# Patient Record
Sex: Male | Born: 1950
Health system: Southern US, Community
[De-identification: ages and names within clinical notes are randomized; demographics above are authoritative.]

## PROBLEM LIST (undated history)

## (undated) DIAGNOSIS — D649 Anemia, unspecified: Secondary | ICD-10-CM

## (undated) DIAGNOSIS — E785 Hyperlipidemia, unspecified: Secondary | ICD-10-CM

## (undated) DIAGNOSIS — K649 Unspecified hemorrhoids: Secondary | ICD-10-CM

## (undated) DIAGNOSIS — I10 Essential (primary) hypertension: Secondary | ICD-10-CM

## (undated) DIAGNOSIS — L0501 Pilonidal cyst with abscess: Secondary | ICD-10-CM

## (undated) DIAGNOSIS — S76019A Strain of muscle, fascia and tendon of unspecified hip, initial encounter: Secondary | ICD-10-CM

## (undated) DIAGNOSIS — E119 Type 2 diabetes mellitus without complications: Secondary | ICD-10-CM

## (undated) HISTORY — DX: Unspecified hemorrhoids: K64.9

## (undated) HISTORY — DX: Pilonidal cyst with abscess: L05.01

## (undated) HISTORY — DX: Hyperlipidemia, unspecified: E78.5

## (undated) HISTORY — DX: Type 2 diabetes mellitus without complications: E11.9

## (undated) HISTORY — DX: Strain of muscle, fascia and tendon of unspecified hip, initial encounter: S76.019A

## (undated) HISTORY — DX: Anemia, unspecified: D64.9

## (undated) HISTORY — DX: Essential (primary) hypertension: I10

---

## 1979-10-25 HISTORY — PX: VASECTOMY: SHX75

## 1983-10-25 HISTORY — PX: LUNG SURGERY: SHX703

## 2001-03-22 LAB — HM DEXA SCAN: HM Dexa Scan: NORMAL

## 2006-02-13 DIAGNOSIS — I10 Essential (primary) hypertension: Secondary | ICD-10-CM | POA: Insufficient documentation

## 2006-04-03 ENCOUNTER — Ambulatory Visit: Payer: Self-pay | Admitting: General Surgery

## 2010-01-28 LAB — PSA: PSA: 1.2

## 2010-06-07 ENCOUNTER — Ambulatory Visit: Payer: Self-pay | Admitting: Family Medicine

## 2010-06-08 ENCOUNTER — Ambulatory Visit: Payer: Self-pay | Admitting: Family Medicine

## 2010-08-25 ENCOUNTER — Ambulatory Visit: Payer: Self-pay | Admitting: Gastroenterology

## 2010-08-25 LAB — HM COLONOSCOPY: HM Colonoscopy: NORMAL

## 2010-08-29 LAB — PATHOLOGY REPORT

## 2014-08-25 LAB — CBC AND DIFFERENTIAL
HCT: 46 % (ref 41–53)
HEMOGLOBIN: 16.3 g/dL (ref 13.5–17.5)
Platelets: 235 10*3/uL (ref 150–399)
WBC: 6.3 10*3/mL

## 2014-12-31 LAB — HEMOGLOBIN A1C: Hgb A1c MFr Bld: 6.8 % — AB (ref 4.0–6.0)

## 2015-05-30 ENCOUNTER — Other Ambulatory Visit: Payer: Self-pay | Admitting: Family Medicine

## 2015-05-30 DIAGNOSIS — E538 Deficiency of other specified B group vitamins: Secondary | ICD-10-CM

## 2015-06-01 DIAGNOSIS — E538 Deficiency of other specified B group vitamins: Secondary | ICD-10-CM | POA: Insufficient documentation

## 2015-06-01 NOTE — Telephone Encounter (Signed)
Last OV 12/2014   

## 2015-06-09 ENCOUNTER — Telehealth: Payer: Self-pay | Admitting: Family Medicine

## 2015-06-09 NOTE — Telephone Encounter (Signed)
Pt is coming in for F/U on 06/22/15 and wanted to know if he should have blood work done before the appt. Pt is aware that Dr. Elease Hashimoto is out of the office this week. Thanks TNP

## 2015-06-09 NOTE — Telephone Encounter (Signed)
Spoke with Arles regarding the note below, pt was advised to be fasting on the day of the appointment for a possible lipids lab.  Thanks,

## 2015-06-12 DIAGNOSIS — M25519 Pain in unspecified shoulder: Secondary | ICD-10-CM | POA: Insufficient documentation

## 2015-06-12 DIAGNOSIS — M25569 Pain in unspecified knee: Secondary | ICD-10-CM | POA: Insufficient documentation

## 2015-06-12 DIAGNOSIS — L0501 Pilonidal cyst with abscess: Secondary | ICD-10-CM | POA: Insufficient documentation

## 2015-06-12 DIAGNOSIS — L409 Psoriasis, unspecified: Secondary | ICD-10-CM | POA: Insufficient documentation

## 2015-06-12 DIAGNOSIS — K649 Unspecified hemorrhoids: Secondary | ICD-10-CM

## 2015-06-12 HISTORY — DX: Unspecified hemorrhoids: K64.9

## 2015-06-12 HISTORY — DX: Pilonidal cyst with abscess: L05.01

## 2015-06-20 ENCOUNTER — Other Ambulatory Visit: Payer: Self-pay | Admitting: Family Medicine

## 2015-06-20 DIAGNOSIS — E119 Type 2 diabetes mellitus without complications: Secondary | ICD-10-CM

## 2015-06-20 DIAGNOSIS — E7801 Familial hypercholesterolemia: Secondary | ICD-10-CM

## 2015-06-22 ENCOUNTER — Encounter: Payer: Self-pay | Admitting: Family Medicine

## 2015-06-22 ENCOUNTER — Ambulatory Visit (INDEPENDENT_AMBULATORY_CARE_PROVIDER_SITE_OTHER): Payer: BC Managed Care – PPO | Admitting: Family Medicine

## 2015-06-22 VITALS — BP 116/60 | HR 66 | Temp 98.2°F | Resp 16 | Wt 128.2 lb

## 2015-06-22 DIAGNOSIS — G2581 Restless legs syndrome: Secondary | ICD-10-CM

## 2015-06-22 DIAGNOSIS — E119 Type 2 diabetes mellitus without complications: Secondary | ICD-10-CM

## 2015-06-22 DIAGNOSIS — E538 Deficiency of other specified B group vitamins: Secondary | ICD-10-CM

## 2015-06-22 DIAGNOSIS — E7801 Familial hypercholesterolemia: Secondary | ICD-10-CM

## 2015-06-22 DIAGNOSIS — J309 Allergic rhinitis, unspecified: Secondary | ICD-10-CM | POA: Insufficient documentation

## 2015-06-22 DIAGNOSIS — J3089 Other allergic rhinitis: Secondary | ICD-10-CM | POA: Diagnosis not present

## 2015-06-22 DIAGNOSIS — E780 Pure hypercholesterolemia: Secondary | ICD-10-CM

## 2015-06-22 LAB — POCT GLYCOSYLATED HEMOGLOBIN (HGB A1C): Hemoglobin A1C: 6.8

## 2015-06-22 NOTE — Progress Notes (Signed)
Patient: Jeremiah Meyer Male    DOB: 11-17-1950   64 y.o.   MRN: [id number] Visit Date: 06/22/2015  Today's Provider: Lorie Phenix, MD   Chief Complaint  Patient presents with  . Diabetes   Subjective:    Diabetes He presents for his follow-up diabetic visit. He has type 2 diabetes mellitus. His disease course has been stable. Pertinent negatives for diabetes include no chest pain, no fatigue, no visual change and no weight loss.   Lab Results  Component Value Date   HGBA1C 6.8 06/22/2015  Not been as active as he has been in the past. Feels the 6.8 is accurate.  Going to try to be more active.   BP has been stable.  No trouble with medication.  No dizziness, chest pain.   RLS is bothering him from time to timed but not regularly. Feels that medication is helping.    Does have some PND, and frequent throat clearing. Does not want to take medication for this.      No Known Allergies Previous Medications   CYANOCOBALAMIN (,VITAMIN B-12,) 1000 MCG/ML INJECTION    INJECT 1 ML EVERY MONTH AS DIRECTED   LISINOPRIL-HYDROCHLOROTHIAZIDE (PRINZIDE,ZESTORETIC) 10-12.5 MG PER TABLET    Take 1 tablet by mouth daily.   MELOXICAM (MOBIC) 15 MG TABLET    Take 1 tablet by mouth as needed.   METFORMIN (GLUCOPHAGE) 1000 MG TABLET    Take 1 tablet by mouth 2 (two) times daily.   MULTIPLE VITAMIN TABLET    Take 1 tablet by mouth daily.   ROPINIROLE (REQUIP) 1 MG TABLET    Take 1 tablet by mouth at bedtime.   SIMVASTATIN (ZOCOR) 20 MG TABLET    Take 1 tablet by mouth daily.    Review of Systems  Constitutional: Negative.  Negative for weight loss and fatigue.  HENT: Positive for congestion and postnasal drip (clear throat a lot. Coughs up  a thickened mucus.  ).   Eyes: Negative.   Respiratory: Negative.   Cardiovascular: Negative.  Negative for chest pain and palpitations.  Gastrointestinal: Negative.   Endocrine: Negative.   Genitourinary: Negative.   Musculoskeletal: Negative.     Skin: Negative.   Allergic/Immunologic: Negative.   Neurological: Negative.   Hematological: Negative.   Psychiatric/Behavioral: Negative.     Social History  Substance Use Topics  . Smoking status: Former Games developer  . Smokeless tobacco: Never Used     Comment: Quit 1986  . Alcohol Use: Yes     Comment: a drinks in 3-4 month don't drink much.   Objective:   BP 116/60 mmHg  Pulse 66  Temp(Src) 98.2 F (36.8 C) (Oral)  Resp 16  Wt 128 lb 3.2 oz (58.151 kg)  Physical Exam  Constitutional: He is oriented to person, place, and time. He appears well-developed and well-nourished.  Cardiovascular: Normal rate and regular rhythm.   Pulmonary/Chest: Effort normal and breath sounds normal.  Neurological: He is alert and oriented to person, place, and time.      Assessment & Plan:     1. Controlled diabetes mellitus type II without complication Stable.  Please notify patient.   - POCT glycosylated hemoglobin (Hb A1C) - CBC w/Diff/Platelet - Insulin and C-Peptide Results for orders placed or performed in visit on 06/22/15  POCT glycosylated hemoglobin (Hb A1C)  Result Value Ref Range   Hemoglobin A1C 6.8     2. Other allergic rhinitis With symptoms.  Does not want to  take medication.    3. Restless leg Stable. Keep medication the same. Will call is worsens.  - TSH  4. B12 deficiency Check labs.  - Vitamin B12  5. Essential familial hypercholesterolemia Stable. Due for labs.   - Lipid panel - Comprehensive metabolic panel     Lorie Phenix, MD  [facility]

## 2015-06-23 ENCOUNTER — Telehealth: Payer: Self-pay

## 2015-06-23 LAB — COMPREHENSIVE METABOLIC PANEL
A/G RATIO: 2.5 (ref 1.1–2.5)
ALBUMIN: 4.5 g/dL (ref 3.6–4.8)
ALK PHOS: 66 IU/L (ref 39–117)
ALT: 14 IU/L (ref 0–44)
AST: 17 IU/L (ref 0–40)
BILIRUBIN TOTAL: 0.9 mg/dL (ref 0.0–1.2)
BUN / CREAT RATIO: 16 (ref 10–22)
BUN: 14 mg/dL (ref 8–27)
CHLORIDE: 98 mmol/L (ref 97–108)
CO2: 25 mmol/L (ref 18–29)
CREATININE: 0.89 mg/dL (ref 0.76–1.27)
Calcium: 9.2 mg/dL (ref 8.6–10.2)
GFR calc Af Amer: 104 mL/min/{1.73_m2} (ref >59)
GFR calc non Af Amer: 90 mL/min/{1.73_m2} (ref >59)
GLOBULIN, TOTAL: 1.8 g/dL (ref 1.5–4.5)
Glucose: 167 mg/dL — ABNORMAL HIGH (ref 65–99)
POTASSIUM: 4.5 mmol/L (ref 3.5–5.2)
SODIUM: 138 mmol/L (ref 134–144)
Total Protein: 6.3 g/dL (ref 6.0–8.5)

## 2015-06-23 LAB — LIPID PANEL
CHOLESTEROL TOTAL: 150 mg/dL (ref 100–199)
Chol/HDL Ratio: 2.9 ratio units (ref 0.0–5.0)
HDL: 52 mg/dL (ref >39)
LDL CALC: 87 mg/dL (ref 0–99)
Triglycerides: 53 mg/dL (ref 0–149)
VLDL CHOLESTEROL CAL: 11 mg/dL (ref 5–40)

## 2015-06-23 LAB — CBC WITH DIFFERENTIAL/PLATELET
BASOS: 1 %
Basophils Absolute: 0 10*3/uL (ref 0.0–0.2)
EOS (ABSOLUTE): 0.1 10*3/uL (ref 0.0–0.4)
EOS: 1 %
HEMATOCRIT: 42.3 % (ref 37.5–51.0)
Hemoglobin: 14.3 g/dL (ref 12.6–17.7)
IMMATURE GRANULOCYTES: 0 %
Immature Grans (Abs): 0 10*3/uL (ref 0.0–0.1)
LYMPHS ABS: 1.2 10*3/uL (ref 0.7–3.1)
Lymphs: 22 %
MCH: 29.5 pg (ref 26.6–33.0)
MCHC: 33.8 g/dL (ref 31.5–35.7)
MCV: 87 fL (ref 79–97)
MONOS ABS: 0.5 10*3/uL (ref 0.1–0.9)
Monocytes: 9 %
NEUTROS PCT: 67 %
Neutrophils Absolute: 3.9 10*3/uL (ref 1.4–7.0)
PLATELETS: 220 10*3/uL (ref 150–379)
RBC: 4.84 x10E6/uL (ref 4.14–5.80)
RDW: 13.5 % (ref 12.3–15.4)
WBC: 5.7 10*3/uL (ref 3.4–10.8)

## 2015-06-23 LAB — VITAMIN B12: VITAMIN B 12: 289 pg/mL (ref 211–946)

## 2015-06-23 LAB — INSULIN AND C-PEPTIDE, SERUM
C-Peptide: 2 ng/mL (ref 1.1–4.4)
INSULIN: 4.5 u[IU]/mL (ref 2.6–24.9)

## 2015-06-23 LAB — TSH: TSH: 1.42 u[IU]/mL (ref 0.450–4.500)

## 2015-06-23 NOTE — Telephone Encounter (Signed)
Pt advised as directed below.   Thanks,   -Gamaliel Charney  

## 2015-06-23 NOTE — Telephone Encounter (Signed)
-----   Message from Lorie Phenix, MD sent at 06/23/2015  2:36 PM EDT ----- Labs stable including insulin and C- peptide. Ok to continue plan of care. Thanks.

## 2015-07-14 ENCOUNTER — Other Ambulatory Visit: Payer: Self-pay | Admitting: Family Medicine

## 2015-07-14 DIAGNOSIS — I10 Essential (primary) hypertension: Secondary | ICD-10-CM

## 2015-08-02 ENCOUNTER — Other Ambulatory Visit: Payer: Self-pay | Admitting: Family Medicine

## 2015-08-02 DIAGNOSIS — G2581 Restless legs syndrome: Secondary | ICD-10-CM

## 2015-09-04 ENCOUNTER — Ambulatory Visit (INDEPENDENT_AMBULATORY_CARE_PROVIDER_SITE_OTHER): Payer: BC Managed Care – PPO | Admitting: Family Medicine

## 2015-09-04 ENCOUNTER — Encounter: Payer: Self-pay | Admitting: Family Medicine

## 2015-09-04 VITALS — BP 102/68 | HR 60 | Temp 98.7°F | Resp 16 | Ht 64.0 in | Wt 126.0 lb

## 2015-09-04 DIAGNOSIS — J3089 Other allergic rhinitis: Secondary | ICD-10-CM | POA: Diagnosis not present

## 2015-09-04 DIAGNOSIS — Z0000 Encounter for general adult medical examination without abnormal findings: Secondary | ICD-10-CM

## 2015-09-04 DIAGNOSIS — Z23 Encounter for immunization: Secondary | ICD-10-CM | POA: Diagnosis not present

## 2015-09-04 DIAGNOSIS — E119 Type 2 diabetes mellitus without complications: Secondary | ICD-10-CM

## 2015-09-04 MED ORDER — FLUTICASONE PROPIONATE 50 MCG/ACT NA SUSP: 2.0000 | Freq: Every day | NASAL | Status: DC

## 2015-09-04 NOTE — Progress Notes (Signed)
Patient ID: Jeremiah Meyer, male   DOB: 1951/06/28, 64 y.o.   MRN: [id number]        Patient: Jeremiah Meyer, Male    DOB: 10-06-1951, 64 y.o.   MRN: [id number] Visit Date: 09/04/2015  Today's Provider: Lorie Phenix, MD   Chief Complaint  Patient presents with  . Annual Exam   Subjective:    Annual physical exam Jeremiah Meyer is a 64 y.o. male who presents today for health maintenance and complete physical. He feels well. He reports exercising stays active with daily activites. He reports he is sleeping fairly well. 11/02/11Colon-Normal 03/22/01 BMD-normal  Lab Results  Component Value Date   WBC 5.7 06/22/2015   HGB 16.3 08/25/2014   HCT 42.3 06/22/2015   PLT 235 08/25/2014   GLUCOSE 167* 06/22/2015   CHOL 150 06/22/2015   TRIG 53 06/22/2015   HDL 52 06/22/2015   LDLCALC 87 06/22/2015   ALT 14 06/22/2015   AST 17 06/22/2015   NA 138 06/22/2015   K 4.5 06/22/2015   CL 98 06/22/2015   CREATININE 0.89 06/22/2015   BUN 14 06/22/2015   CO2 25 06/22/2015   TSH 1.420 06/22/2015   PSA 1.2 01/28/2010   HGBA1C 6.8 06/22/2015   ------------------------------------------------------------------------------------------------------------------------------- Review of Systems  Constitutional: Negative.   HENT: Negative.   Eyes: Negative.   Respiratory: Negative.   Cardiovascular: Negative.   Gastrointestinal: Negative.   Endocrine: Negative.   Genitourinary: Negative.   Musculoskeletal: Negative.   Skin: Negative.   Allergic/Immunologic: Negative.   Neurological: Negative.   Hematological: Negative.   Psychiatric/Behavioral: Negative.     Social History He  reports that he has quit smoking. He has never used smokeless tobacco. He reports that he drinks about 0.6 oz of alcohol per week. He reports that he does not use illicit drugs. Social History   Social History  . Marital Status: Married    Spouse Name: beatrice  . Number of Children: 1  . Years of  Education: N/A   Social History Main Topics  . Smoking status: Former Games developer  . Smokeless tobacco: Never Used     Comment: Quit 1986  . Alcohol Use: 0.6 oz/week    1 Cans of beer per week     Comment: a drinks in 3-4 month don't drink much.  . Drug Use: No  . Sexual Activity: Not Asked   Other Topics Concern  . None   Social History Narrative    Patient Active Problem List   Diagnosis Date Noted  . Allergic rhinitis 06/22/2015  . Hemorrhoid 06/12/2015  . Gonalgia 06/12/2015  . Cyst, pilonidal, with abscess 06/12/2015  . Psoriasis 06/12/2015  . Pain in shoulder 06/12/2015  . B12 deficiency 06/01/2015  . Anemia due to blood loss, chronic 04/12/2010  . Cardiac conduction disorder 04/07/2010  . ED (erectile dysfunction) of organic origin 03/27/2009  . Cervical pain 05/16/2007  . Enthesopathy 05/16/2007  . Essential familial hypercholesterolemia 06/14/2006  . Essential (primary) hypertension 02/13/2006  . Restless leg 05/30/2002  . Controlled diabetes mellitus type II without complication (HCC) 08/09/1999    Past Surgical History  Procedure Laterality Date  . Lung surgery Left 1985    S/P RECURRENT PNEUMOTHORAX  . Vasectomy  64    Family History  Family Status  Relation Status Death Age  . Mother Alive   . Father Deceased    His family history is not on file.    No Known Allergies  Previous Medications  CYANOCOBALAMIN (,VITAMIN B-12,) 1000 MCG/ML INJECTION    INJECT 1 ML EVERY MONTH AS DIRECTED   LISINOPRIL-HYDROCHLOROTHIAZIDE (PRINZIDE,ZESTORETIC) 10-12.5 MG PER TABLET    TAKE 1 TABLET DAILY   LORATADINE (CLARITIN) 10 MG TABLET    Take 10 mg by mouth daily.   MELOXICAM (MOBIC) 15 MG TABLET    Take 1 tablet by mouth as needed.   METFORMIN (GLUCOPHAGE) 1000 MG TABLET    TAKE 1 TABLET TWICE A DAY   MULTIPLE VITAMIN TABLET    Take 1 tablet by mouth daily.   ROPINIROLE (REQUIP) 1 MG TABLET    TAKE 1 TABLET AT BEDTIME FOR RESTLESS LEGS   SIMVASTATIN (ZOCOR) 20  MG TABLET    TAKE 1 TABLET DAILY    Patient Care Team: Lorie Phenix, MD as PCP - General (Family Medicine)     Objective:   Vitals: BP 102/68 mmHg  Pulse 60  Temp(Src) 98.7 F (37.1 C) (Oral)  Resp 16  Ht  (1.626 m)  Wt 126 lb (57.153 kg)  BMI 21.62 kg/m2  SpO2 98%   Physical Exam  Constitutional: He is oriented to person, place, and time. He appears well-developed and well-nourished.  HENT:  Head: Normocephalic and atraumatic.  Right Ear: External ear normal.  Left Ear: External ear normal.  Nose: Mucosal edema present.  Mouth/Throat: Oropharynx is clear and moist.  Eyes: Conjunctivae and EOM are normal. Pupils are equal, round, and reactive to light.  Neck: Normal range of motion. Neck supple.  Cardiovascular: Normal rate, regular rhythm, normal heart sounds and intact distal pulses.   Pulmonary/Chest: Effort normal and breath sounds normal.  Abdominal: Soft. Bowel sounds are normal.  Musculoskeletal: Normal range of motion.  Neurological: He is alert and oriented to person, place, and time. He has normal reflexes.  Skin: Skin is warm and dry.  Psychiatric: He has a normal mood and affect. His behavior is normal. Judgment and thought content normal.     Depression Screen PHQ 2/9 Scores 09/04/2015  PHQ - 2 Score 0      Assessment & Plan:     Routine Health Maintenance and Physical Exam  Exercise Activities and Dietary recommendations Goals    . Exercise 150 minutes per week (moderate activity)       Immunization History  Administered Date(s) Administered  . Influenza,inj,Quad PF,36+ Mos 09/04/2015  . Pneumococcal Polysaccharide-23 09/09/2013  . Td 10/24/1997        1. Annual physical exam Stable. Patient advised to continue eating healthy and exercise daily.  2. Need for influenza vaccination - Flu Vaccine QUAD 36+ mos IM  3. Other allergic rhinitis Worsening. Patient started on fluticasone 50 MCG/ACT as below.  - fluticasone  (FLONASE) 50 MCG/ACT nasal spray; Place 2 sprays into both nostrils daily.  Dispense: 16 g; Refill: 3  4. Controlled type 2 diabetes mellitus without complication, without long-term current use of insulin (HCC) Stable. Follow-up in 3 months.  Diabetic Foot Exam - Simple   Simple Foot Form  Diabetic Foot exam was performed with the following findings:  Yes 09/04/2015 10:01 AM  Visual Inspection  No deformities, no ulcerations, no other skin breakdown bilaterally:  Yes  Sensation Testing  Intact to touch and monofilament testing bilaterally:  Yes  Pulse Check  Posterior Tibialis and Dorsalis pulse intact bilaterally:  Yes  Comments         Patient seen and examined by Dr. Leo Grosser, and note scribed by Liz Beach. Dimas, CMA.  I  have reviewed the document for accuracy and completeness and I agree with above. Leo Grosser- Katrine Radich J. Natsuko Kelsay, MD   Lorie PhenixNancy Jacoby Ritsema, MD    --------------------------------------------------------------------

## 2015-09-04 NOTE — Patient Instructions (Signed)
Patient advised to start taking Aspirin 81 mg daily.

## 2015-10-02 ENCOUNTER — Encounter: Payer: BC Managed Care – PPO | Admitting: Family Medicine

## 2015-10-07 ENCOUNTER — Other Ambulatory Visit: Payer: Self-pay | Admitting: Family Medicine

## 2015-10-07 DIAGNOSIS — E119 Type 2 diabetes mellitus without complications: Secondary | ICD-10-CM

## 2015-10-29 ENCOUNTER — Encounter: Payer: Self-pay | Admitting: Family Medicine

## 2015-12-01 ENCOUNTER — Other Ambulatory Visit: Payer: Self-pay | Admitting: Family Medicine

## 2015-12-01 DIAGNOSIS — E538 Deficiency of other specified B group vitamins: Secondary | ICD-10-CM

## 2015-12-04 ENCOUNTER — Ambulatory Visit (INDEPENDENT_AMBULATORY_CARE_PROVIDER_SITE_OTHER): Payer: BC Managed Care – PPO | Admitting: Family Medicine

## 2015-12-04 ENCOUNTER — Encounter: Payer: Self-pay | Admitting: Family Medicine

## 2015-12-04 VITALS — BP 128/80 | HR 68 | Temp 98.1°F | Resp 16 | Wt 133.0 lb

## 2015-12-04 DIAGNOSIS — E119 Type 2 diabetes mellitus without complications: Secondary | ICD-10-CM | POA: Diagnosis not present

## 2015-12-04 DIAGNOSIS — I10 Essential (primary) hypertension: Secondary | ICD-10-CM | POA: Diagnosis not present

## 2015-12-04 LAB — POCT GLYCOSYLATED HEMOGLOBIN (HGB A1C)
ESTIMATED AVERAGE GLUCOSE: 171
HEMOGLOBIN A1C: 7.6

## 2015-12-04 MED ORDER — SITAGLIPTIN PHOSPHATE 100 MG PO TABS: 100.0000 mg | ORAL_TABLET | Freq: Every day | ORAL | Status: DC

## 2015-12-04 NOTE — Progress Notes (Signed)
Subjective:    Patient ID: Jeremiah Meyer, male    DOB: 1951/06/08, 65 y.o.   MRN: [id number]  Diabetes He presents for his follow-up (Last A1C was 06/22/2015 and was 6.8%) diabetic visit. He has type 2 diabetes mellitus. There are no hypoglycemic associated symptoms. Pertinent negatives for hypoglycemia include no sweats. Pertinent negatives for diabetes include no blurred vision, no chest pain, no fatigue, no foot paresthesias, no foot ulcerations, no polydipsia, no polyphagia, no polyuria, no visual change and no weakness. Symptoms are stable. Current diabetic treatment includes oral agent (monotherapy) (Metformin 1000 mg BID). He is compliant with treatment all of the time. He is following a generally healthy diet. He participates in exercise daily (active while working). An ACE inhibitor/angiotensin II receptor blocker is being taken. Eye exam is current [facility] December, 2016).  Hypertension This is a chronic problem. The problem is unchanged. The problem is controlled. Pertinent negatives include no anxiety, blurred vision, chest pain, malaise/fatigue, neck pain, orthopnea, palpitations, peripheral edema, shortness of breath or sweats. Risk factors for coronary artery disease include dyslipidemia, diabetes mellitus and male gender. Treatments tried: Lisinopril-HCTZ 10-12.5 mg. The current treatment provides moderate improvement. There are no compliance problems.       Review of Systems  Constitutional: Negative for malaise/fatigue and fatigue.  Eyes: Negative for blurred vision.  Respiratory: Negative for shortness of breath.   Cardiovascular: Negative for chest pain, palpitations and orthopnea.  Endocrine: Negative for polydipsia, polyphagia and polyuria.  Musculoskeletal: Negative for neck pain.  Neurological: Negative for weakness.   BP 128/80 mmHg  Pulse 68  Temp(Src) 98.1 F (36.7 C) (Oral)  Resp 16  Wt 133 lb (60.328 kg)   Patient Active Problem List   Diagnosis  Date Noted  . Allergic rhinitis 06/22/2015  . Hemorrhoid 06/12/2015  . Gonalgia 06/12/2015  . Cyst, pilonidal, with abscess 06/12/2015  . Psoriasis 06/12/2015  . Pain in shoulder 06/12/2015  . B12 deficiency 06/01/2015  . Anemia due to blood loss, chronic 04/12/2010  . Cardiac conduction disorder 04/07/2010  . ED (erectile dysfunction) of organic origin 03/27/2009  . Cervical pain 05/16/2007  . Enthesopathy 05/16/2007  . Essential familial hypercholesterolemia 06/14/2006  . Essential (primary) hypertension 02/13/2006  . Restless leg 05/30/2002  . Controlled diabetes mellitus type II without complication (HCC) 08/09/1999   Past Medical History  Diagnosis Date  . Anemia   . Diabetes mellitus without complication (HCC)   . Hypertension    Current Outpatient Prescriptions on File Prior to Visit  Medication Sig  . aspirin 81 MG tablet Take 81 mg by mouth daily.  . cyanocobalamin (,VITAMIN B-12,) 1000 MCG/ML injection INJECT 1 ML EVERY MONTH AS DIRECTED  . lisinopril-hydrochlorothiazide (PRINZIDE,ZESTORETIC) 10-12.5 MG per tablet TAKE 1 TABLET DAILY  . metFORMIN (GLUCOPHAGE) 1000 MG tablet TAKE 1 TABLET TWICE A DAY  . Multiple Vitamin tablet Take 1 tablet by mouth daily.  . ONE TOUCH ULTRA TEST test strip USE 1 STRIP TO CHECK BLOOD SUGAR ONCE DAILY  . rOPINIRole (REQUIP) 1 MG tablet TAKE 1 TABLET AT BEDTIME FOR RESTLESS LEGS  . simvastatin (ZOCOR) 20 MG tablet TAKE 1 TABLET DAILY  . fluticasone (FLONASE) 50 MCG/ACT nasal spray Place 2 sprays into both nostrils daily. (Patient not taking: Reported on 12/04/2015)   No current facility-administered medications on file prior to visit.   No Known Allergies Past Surgical History  Procedure Laterality Date  . Lung surgery Left 1985    S/P RECURRENT PNEUMOTHORAX  . Vasectomy  47   Social History   Social History  . Marital Status: Married    Spouse Name: Jeremiah Meyer  . Number of Children: 1  . Years of Education: N/A    Occupational History  . Not on file.   Social History Main Topics  . Smoking status: Former Games developer  . Smokeless tobacco: Never Used     Comment: Quit 1986  . Alcohol Use: 0.6 oz/week    1 Cans of beer per week     Comment: a drinks in 3-4 month don't drink much.  . Drug Use: No  . Sexual Activity: Not on file   Other Topics Concern  . Not on file   Social History Narrative   No family history on file.      Objective:   Physical Exam  Constitutional: He is oriented to person, place, and time. He appears well-developed and well-nourished.  Cardiovascular: Normal rate and regular rhythm.   Pulmonary/Chest: Effort normal and breath sounds normal.  Neurological: He is alert and oriented to person, place, and time.  Psychiatric: He has a normal mood and affect. His behavior is normal. Judgment and thought content normal.   BP 128/80 mmHg  Pulse 68  Temp(Src) 98.1 F (36.7 C) (Oral)  Resp 16  Wt 133 lb (60.328 kg)      Assessment & Plan:  1. Controlled type 2 diabetes mellitus without complication, without long-term current use of insulin (HCC) Not at goal.  Will add Januvia and recheck in 3 months.   - POCT glycosylated hemoglobin (Hb A1C) - sitaGLIPtin (JANUVIA) 100 MG tablet; Take 1 tablet (100 mg total) by mouth daily.  Dispense: 30 tablet; Refill: 5 Results for orders placed or performed in visit on 12/04/15  POCT glycosylated hemoglobin (Hb A1C)  Result Value Ref Range   Hemoglobin A1C 7.6    Est. average glucose Bld gHb Est-mCnc 171     2. Essential (primary) hypertension Condition is stable. Please continue current medication and  plan of care as noted.    Patient was seen and examined by Leo Grosser, MD, and note scribed by Allene Dillon, CMA. I have reviewed the document for accuracy and completeness and I agree with above. Leo Grosser, MD   Lorie Phenix, MD

## 2016-01-18 ENCOUNTER — Telehealth: Payer: Self-pay | Admitting: Family Medicine

## 2016-01-18 MED ORDER — GLIPIZIDE ER 2.5 MG PO TB24
2.5000 mg | ORAL_TABLET | Freq: Every day | ORAL | Status: DC
Start: 2016-01-18 — End: 2016-04-04

## 2016-01-18 NOTE — Telephone Encounter (Signed)
Not sure why elevated. Can be related to infection, may need ov with another provider. Or can start Glipizide Er 2.5 daily. Thanks.

## 2016-01-18 NOTE — Telephone Encounter (Signed)
Patient denies any infection. He would like to start Glipizide 2.5mg . However, he wants to discontinue Januvia 100mg . He does not want to take all 3 diabetic medications together. He wants to take Glipizide instead of Januvia. Is this ok? Please advise. Thanks!

## 2016-01-18 NOTE — Telephone Encounter (Signed)
Needs to take all 3. Diabetes is getting worse. Happens with time, unfortunately. Thanks.

## 2016-01-18 NOTE — Telephone Encounter (Signed)
Patient reports he is taking Metformin 1000 mg BID and Januvia 100 mg daily in the morning. Patient reports fasting blood sugars are ranging 170-180's. Patient reports that he has been watching his diet and is exercising. Please advise. sd

## 2016-01-18 NOTE — Telephone Encounter (Signed)
Dr. Santiago BurMaloney's Pt. He is diabetic.  He is on [place].   His blood glucose reading have been good until last week.  His reading have been from 130 to 180 as a high.   He would like to talk to someone and as if he should adjust the meds or not.  His call back is [phone]  Thanks Barth Kirkseri

## 2016-01-19 NOTE — Telephone Encounter (Signed)
Advised pt as below. Does agree to take all 3 medications, though does want to talk to provider about Januvia at FU OV. States his research insinuates that Januvia does not help all people control their DM. Allene DillonEmily Drozdowski, CMA

## 2016-01-21 ENCOUNTER — Encounter: Payer: BC Managed Care – PPO | Admitting: Family Medicine

## 2016-01-27 ENCOUNTER — Ambulatory Visit (INDEPENDENT_AMBULATORY_CARE_PROVIDER_SITE_OTHER): Payer: PPO | Admitting: Family Medicine

## 2016-01-27 ENCOUNTER — Encounter: Payer: Self-pay | Admitting: Family Medicine

## 2016-01-27 VITALS — BP 110/80 | HR 68 | Temp 98.1°F | Resp 16 | Ht 64.0 in | Wt 129.0 lb

## 2016-01-27 DIAGNOSIS — Z23 Encounter for immunization: Secondary | ICD-10-CM | POA: Diagnosis not present

## 2016-01-27 DIAGNOSIS — Z0000 Encounter for general adult medical examination without abnormal findings: Secondary | ICD-10-CM | POA: Diagnosis not present

## 2016-01-27 NOTE — Progress Notes (Signed)
Patient ID: Jeremiah Meyer, male   DOB: 03-01-1951, 65 y.o.   MRN: [id number]       Patient: Jeremiah Meyer, Male    DOB: 04-20-1951, 65 y.o.   MRN: [id number] Visit Date: 01/27/2016  Today's Provider: Lorie Phenix, MD   Chief Complaint  Patient presents with  . Medicare Wellness   Subjective:    Annual wellness visit Jeremiah Meyer is a 65 y.o. male. He feels well. He reports exercising active with daily activites. He reports he is sleeping fairly well.  09/04/15 CPE 08/25/10 Colonoscopy-normal, recheck 10 years  -----------------------------------------------------------   Review of Systems  Constitutional: Negative.   HENT: Negative.   Eyes: Negative.   Respiratory: Negative.   Cardiovascular: Negative.   Gastrointestinal: Negative.   Endocrine: Negative.   Genitourinary: Negative.   Musculoskeletal: Negative.   Skin: Negative.   Allergic/Immunologic: Negative.   Neurological: Negative.   Hematological: Negative.   Psychiatric/Behavioral: Negative.     Social History   Social History  . Marital Status: Married    Spouse Name: Jeremiah Meyer  . Number of Children: 1  . Years of Education: N/A   Occupational History  . Not on file.   Social History Main Topics  . Smoking status: Former Games developer  . Smokeless tobacco: Never Used     Comment: Quit 1986  . Alcohol Use: 0.6 oz/week    1 Cans of beer per week     Comment: a drinks in 3-4 month don't drink much.  . Drug Use: Yes    Special: Marijuana  . Sexual Activity: Not on file   Other Topics Concern  . Not on file   Social History Narrative    Past Medical History  Diagnosis Date  . Anemia   . Diabetes mellitus without complication (HCC)   . Hypertension      Patient Active Problem List   Diagnosis Date Noted  . Allergic rhinitis 06/22/2015  . Hemorrhoid 06/12/2015  . Gonalgia 06/12/2015  . Cyst, pilonidal, with abscess 06/12/2015  . Psoriasis 06/12/2015  . Pain in shoulder 06/12/2015  .  B12 deficiency 06/01/2015  . Anemia due to blood loss, chronic 04/12/2010  . Cardiac conduction disorder 04/07/2010  . ED (erectile dysfunction) of organic origin 03/27/2009  . Cervical pain 05/16/2007  . Enthesopathy 05/16/2007  . Essential familial hypercholesterolemia 06/14/2006  . Essential (primary) hypertension 02/13/2006  . Restless leg 05/30/2002  . Controlled diabetes mellitus type II without complication (HCC) 08/09/1999    Past Surgical History  Procedure Laterality Date  . Lung surgery Left 1985    S/P RECURRENT PNEUMOTHORAX  . Vasectomy  1981    His family history includes Cancer in his brother and father; Diabetes in his brother.    Previous Medications   ASPIRIN 81 MG TABLET    Take 81 mg by mouth daily.   CYANOCOBALAMIN (,VITAMIN B-12,) 1000 MCG/ML INJECTION    INJECT 1 ML EVERY MONTH AS DIRECTED   FLUTICASONE (FLONASE) 50 MCG/ACT NASAL SPRAY    Place 2 sprays into both nostrils daily.   GLIPIZIDE (GLUCOTROL XL) 2.5 MG 24 HR TABLET    Take 1 tablet (2.5 mg total) by mouth daily with breakfast.   LISINOPRIL-HYDROCHLOROTHIAZIDE (PRINZIDE,ZESTORETIC) 10-12.5 MG PER TABLET    TAKE 1 TABLET DAILY   METFORMIN (GLUCOPHAGE) 1000 MG TABLET    TAKE 1 TABLET TWICE A DAY   MULTIPLE VITAMIN TABLET    Take 1 tablet by mouth daily.   ONE TOUCH ULTRA TEST  TEST STRIP    USE 1 STRIP TO CHECK BLOOD SUGAR ONCE DAILY   ROPINIROLE (REQUIP) 1 MG TABLET    TAKE 1 TABLET AT BEDTIME FOR RESTLESS LEGS   SIMVASTATIN (ZOCOR) 20 MG TABLET    TAKE 1 TABLET DAILY   SITAGLIPTIN (JANUVIA) 100 MG TABLET    Take 1 tablet (100 mg total) by mouth daily.    Patient Care Team: Jeremiah PhenixNancy Jodeci Roarty, MD as PCP - General (Family Medicine)     Objective:   Vitals: BP 110/80 mmHg  Pulse 68  Temp(Src) 98.1 F (36.7 C) (Oral)  Resp 16  Ht 5\' 4"  (1.626 m)  Wt 129 lb (58.514 kg)  BMI 22.13 kg/m2  SpO2 97%  Physical Exam  Constitutional: He is oriented to person, place, and time. He appears well-developed  and well-nourished.  Cardiovascular: Normal rate, regular rhythm and normal heart sounds.   Pulmonary/Chest: Effort normal and breath sounds normal.  Neurological: He is alert and oriented to person, place, and time.  Psychiatric: He has a normal mood and affect. His behavior is normal. Judgment and thought content normal.    Activities of Daily Living In your present state of health, do you have any difficulty performing the following activities: 01/27/2016 09/04/2015  Hearing? N N  Vision? N N  Difficulty concentrating or making decisions? N N  Walking or climbing stairs? N N  Dressing or bathing? N N  Doing errands, shopping? N N    Fall Risk Assessment Fall Risk  01/27/2016 09/04/2015  Falls in the past year? No No     Depression Screen PHQ 2/9 Scores 01/27/2016 09/04/2015  PHQ - 2 Score 1 0    Cognitive Testing - 6-CIT  Correct? Score   What year is it? yes 0 0 or 4  What month is it? yes 0 0 or 3  Memorize:    Floyde ParkinsJohn,  [place],  42,  [address],  [place],      What time is it? (within 1 hour) yes 0 0 or 3  Count backwards from 20 yes 0 0, 2, or 4  Name the months of the year yes 0 0, 2, or 4  Repeat name & address above no 2 0, 2, 4, 6, 8, or 10       TOTAL SCORE  2/28   Interpretation:  Normal  Normal (0-7) Abnormal (8-28)       Assessment & Plan:     Annual Wellness Visit  Reviewed patient's Family Medical History Reviewed and updated list of patient's medical providers Assessment of cognitive impairment was done Assessed patient's functional ability Established a written schedule for health screening services Health Risk Assessent Completed and Reviewed  Exercise Activities and Dietary recommendations Goals    . Exercise 150 minutes per week (moderate activity)       Immunization History  Administered Date(s) Administered  . Influenza,inj,Quad PF,36+ Mos 09/04/2015  . Pneumococcal Conjugate-13 01/27/2016  . Pneumococcal Polysaccharide-23 09/09/2013  .  Td 10/24/1997       1. Medicare annual wellness visit, initial Stable. Patient advised to continue eating healthy and exercise daily. - EKG 12-Lead  2. Need for pneumococcal vaccination - Pneumococcal conjugate vaccine 13-valent IM   Patient seen and examined by Dr. Leo GrosserNancy J.. Makila Colombe, and note scribed by Liz BeachSulibeya S. Dimas, CMA.  I have reviewed the document for accuracy and completeness and I agree with above. Leo Grosser- Theoplis Garciagarcia J. Mihail Prettyman, MD   Jeremiah PhenixNancy Kartik Fernando, MD    ------------------------------------------------------------------------------------------------------------

## 2016-03-23 ENCOUNTER — Other Ambulatory Visit: Payer: Self-pay | Admitting: Family Medicine

## 2016-03-23 ENCOUNTER — Ambulatory Visit (INDEPENDENT_AMBULATORY_CARE_PROVIDER_SITE_OTHER): Payer: PPO | Admitting: Family Medicine

## 2016-03-23 ENCOUNTER — Encounter: Payer: Self-pay | Admitting: Family Medicine

## 2016-03-23 VITALS — BP 130/78 | HR 68 | Temp 98.1°F | Resp 16 | Wt 129.0 lb

## 2016-03-23 DIAGNOSIS — E119 Type 2 diabetes mellitus without complications: Secondary | ICD-10-CM

## 2016-03-23 DIAGNOSIS — E7801 Familial hypercholesterolemia: Secondary | ICD-10-CM

## 2016-03-23 DIAGNOSIS — I10 Essential (primary) hypertension: Secondary | ICD-10-CM

## 2016-03-23 LAB — POCT GLYCOSYLATED HEMOGLOBIN (HGB A1C): HEMOGLOBIN A1C: 6.3

## 2016-03-23 NOTE — Progress Notes (Signed)
Patient ID: Jeremiah Meyer, male   DOB: 11/02/1950, 65 y.o.   MRN: [id number]         Patient: Jeremiah Meyer Male    DOB: 08/24/1951   [age] y.o.   MRN: [id number] Visit Date: 03/23/2016  Today's Provider: Lorie PhenixNancy Damian Buckles, MD   Chief Complaint  Patient presents with  . Diabetes  . Hypertension  . Hyperlipidemia   Subjective:    HPI    Diabetes Mellitus Type II, Follow-up:   Lab Results  Component Value Date   HGBA1C 6.3 03/23/2016   HGBA1C 7.6 12/04/2015   HGBA1C 6.8 06/22/2015   Last seen for diabetes 3 months ago.  Management since then includes started Januvia. He reports excellent compliance with treatment. He is not having side effects.  Current symptoms include hyperglycemia especially in the mornings.  and have been stable. Home blood sugar records: Low to high 100's; pt reports it is higher in the mornings.   Episodes of hypoglycemia? no   Most Recent Eye Exam: UTD Weight trend: stable Current diet: in general, a "healthy" diet   Current exercise: Pt does not have a formal exercise routine; but he stays very active.   ------------------------------------------------------------------------   Hypertension, follow-up:  BP Readings from Last 3 Encounters:  03/23/16 130/78  01/27/16 110/80  12/04/15 128/80    He was last seen for hypertension 3 months ago.  Management since that visit includes None .He reports excellent compliance with treatment. He is not having side effects.  He is exercising. He is adherent to low salt diet.   He is experiencing none.  Patient denies chest pain, fatigue, irregular heart beat, lower extremity edema and syncope.   Cardiovascular risk factors include advanced age (older than 7055 for men, 7765 for women), diabetes mellitus, dyslipidemia and male gender.  ------------------------------------------------------------------------    Lipid/Cholesterol, Follow-up:   Last seen for this 3 months ago.  Management since that  visit includes None.  Last Lipid Panel:    Component Value Date/Time   CHOL 150 06/22/2015 1032   TRIG 53 06/22/2015 1032   HDL 52 06/22/2015 1032   CHOLHDL 2.9 06/22/2015 1032   LDLCALC 87 06/22/2015 1032    He reports excellent compliance with treatment. He is not having side effects.   Wt Readings from Last 3 Encounters:  03/23/16 129 lb (58.514 kg)  01/27/16 129 lb (58.514 kg)  12/04/15 133 lb (60.328 kg)    ------------------------------------------------------------------------        No Known Allergies Previous Medications   ASPIRIN 81 MG TABLET    Take 81 mg by mouth daily.   CYANOCOBALAMIN (,VITAMIN B-12,) 1000 MCG/ML INJECTION    INJECT 1 ML EVERY MONTH AS DIRECTED   FLUTICASONE (FLONASE) 50 MCG/ACT NASAL SPRAY    Place 2 sprays into both nostrils daily.   GLIPIZIDE (GLUCOTROL XL) 2.5 MG 24 HR TABLET    Take 1 tablet (2.5 mg total) by mouth daily with breakfast.   LISINOPRIL-HYDROCHLOROTHIAZIDE (PRINZIDE,ZESTORETIC) 10-12.5 MG PER TABLET    TAKE 1 TABLET DAILY   METFORMIN (GLUCOPHAGE) 1000 MG TABLET    TAKE 1 TABLET TWICE A DAY   MULTIPLE VITAMIN TABLET    Take 1 tablet by mouth daily.   ONE TOUCH ULTRA TEST TEST STRIP    USE 1 STRIP TO CHECK BLOOD SUGAR ONCE DAILY   ROPINIROLE (REQUIP) 1 MG TABLET    TAKE 1 TABLET AT BEDTIME FOR RESTLESS LEGS   SIMVASTATIN (ZOCOR) 20 MG TABLET  TAKE 1 TABLET DAILY    Review of Systems  Constitutional: Negative.   Respiratory: Negative.   Cardiovascular: Negative.   Gastrointestinal: Negative.   Musculoskeletal: Negative.   Neurological: Negative for dizziness, tremors, light-headedness, numbness and headaches.    Social History  Substance Use Topics  . Smoking status: Former Games developer  . Smokeless tobacco: Never Used     Comment: Quit 1986  . Alcohol Use: 0.6 oz/week    1 Cans of beer per week     Comment: a drinks in 3-4 month don't drink much.   Objective:   BP 130/78 mmHg  Pulse 68  Temp(Src) 98.1 F (36.7  C) (Oral)  Resp 16  Wt 129 lb (58.514 kg)  Physical Exam  Constitutional: He is oriented to person, place, and time. He appears well-developed and well-nourished.  Cardiovascular: Normal rate, regular rhythm and normal heart sounds.   Pulmonary/Chest: Effort normal and breath sounds normal.  Neurological: He is alert and oriented to person, place, and time.  Skin: Skin is warm and dry.  Psychiatric: He has a normal mood and affect. His behavior is normal. Judgment and thought content normal.   Results for orders placed or performed in visit on 03/23/16  POCT glycosylated hemoglobin (Hb A1C)  Result Value Ref Range   Hemoglobin A1C 6.3         Assessment & Plan:     1. Essential (primary) hypertension Stable, continue current medications.  2. Controlled type 2 diabetes mellitus without complication, without long-term current use of insulin (HCC) A1C greatly improved at 6.3%; continue current medications.  Will recheck in about 3 months with Dr. Sherrie Mustache.   - POCT glycosylated hemoglobin (Hb A1C)  3. Essential familial hypercholesterolemia Stable, continue current medications.   Patient was seen and examined by Leo Grosser, MD, and note scribed by Kavin Leech, CMA.   I have reviewed the document for accuracy and completeness and I agree with above. - Leo Grosser, MD      Lorie Phenix, MD  [facility]

## 2016-04-04 ENCOUNTER — Encounter: Payer: Self-pay | Admitting: Family Medicine

## 2016-04-04 ENCOUNTER — Ambulatory Visit (INDEPENDENT_AMBULATORY_CARE_PROVIDER_SITE_OTHER): Payer: PPO | Admitting: Family Medicine

## 2016-04-04 VITALS — BP 116/74 | HR 68 | Temp 98.4°F | Resp 20 | Wt 129.0 lb

## 2016-04-04 DIAGNOSIS — K611 Rectal abscess: Secondary | ICD-10-CM | POA: Diagnosis not present

## 2016-04-04 DIAGNOSIS — L0591 Pilonidal cyst without abscess: Secondary | ICD-10-CM | POA: Diagnosis not present

## 2016-04-04 MED ORDER — AMOXICILLIN-POT CLAVULANATE 875-125 MG PO TABS: 1.0000 | ORAL_TABLET | Freq: Two times a day (BID) | ORAL | Status: DC

## 2016-04-04 NOTE — Progress Notes (Signed)
Subjective:    Patient ID: Jeremiah Meyer, male    DOB: 02/23/1951, 65 y.o.   MRN: [id number]  HPI  65 yo male with the beginnings of anal "fistula" over Christmas holiday.  Tried OTC cream etc. Did finally have it opened here and it helped.   Was really painful and felt much better after it was lanced.   Was placed on antibiotic and it resolved. Was sent to surgeon but did not get anything done at that time as it resolved. Currently having some pain.  Not sure if the same.  He is thinking of traveling and it afraid it is going to flare up.  Has small sore this time.  Can not actually see it.  Just not right. About a cm, feels it is better.Has been using antibiotic ointment. Tender to palpation.    Patient Active Problem List   Diagnosis Date Noted  . Allergic rhinitis 06/22/2015  . Hemorrhoid 06/12/2015  . Gonalgia 06/12/2015  . Cyst, pilonidal, with abscess 06/12/2015  . Psoriasis 06/12/2015  . Pain in shoulder 06/12/2015  . B12 deficiency 06/01/2015  . Anemia due to blood loss, chronic 04/12/2010  . Cardiac conduction disorder 04/07/2010  . ED (erectile dysfunction) of organic origin 03/27/2009  . Cervical pain 05/16/2007  . Enthesopathy 05/16/2007  . Essential familial hypercholesterolemia 06/14/2006  . Essential (primary) hypertension 02/13/2006  . Restless leg 05/30/2002  . Controlled diabetes mellitus type II without complication (HCC) 08/09/1999   Past Medical History  Diagnosis Date  . Anemia   . Diabetes mellitus without complication (HCC)   . Hypertension    Current Outpatient Prescriptions on File Prior to Visit  Medication Sig  . aspirin 81 MG tablet Take 81 mg by mouth daily.  . cyanocobalamin (,VITAMIN B-12,) 1000 MCG/ML injection INJECT 1 ML EVERY MONTH AS DIRECTED  . fluticasone (FLONASE) 50 MCG/ACT nasal spray Place 2 sprays into both nostrils daily.  Marland Kitchen. JANUVIA 100 MG tablet TAKE 1 TABLET (100 MG TOTAL) BY MOUTH DAILY.  Marland Kitchen. lisinopril-hydrochlorothiazide  (PRINZIDE,ZESTORETIC) 10-12.5 MG per tablet TAKE 1 TABLET DAILY  . metFORMIN (GLUCOPHAGE) 1000 MG tablet TAKE 1 TABLET TWICE A DAY  . Multiple Vitamin tablet Take 1 tablet by mouth daily.  . ONE TOUCH ULTRA TEST test strip USE 1 STRIP TO CHECK BLOOD SUGAR ONCE DAILY  . rOPINIRole (REQUIP) 1 MG tablet TAKE 1 TABLET AT BEDTIME FOR RESTLESS LEGS  . simvastatin (ZOCOR) 20 MG tablet TAKE 1 TABLET DAILY   No current facility-administered medications on file prior to visit.   No Known Allergies Past Surgical History  Procedure Laterality Date  . Lung surgery Left 1985    S/P RECURRENT PNEUMOTHORAX  . Vasectomy  1981   Social History   Social History  . Marital Status: Married    Spouse Name: beatrice  . Number of Children: 1  . Years of Education: N/A   Occupational History  . Not on file.   Social History Main Topics  . Smoking status: Former Games developermoker  . Smokeless tobacco: Never Used     Comment: Quit 1986  . Alcohol Use: 0.6 oz/week    1 Cans of beer per week     Comment: a drinks in 3-4 month don't drink much.  . Drug Use: Yes    Special: Marijuana  . Sexual Activity: Not on file   Other Topics Concern  . Not on file   Social History Narrative   Family History  Problem Relation Age of  Onset  . Cancer Father     liver  . Cancer Brother     stomach and intestine  . Diabetes Brother       Review of Systems  Constitutional: Negative for fever, chills, diaphoresis, activity change, appetite change, fatigue and unexpected weight change.  Gastrointestinal: Positive for rectal pain. Negative for abdominal pain, diarrhea, constipation, blood in stool and anal bleeding.      Objective:   Physical Exam  Constitutional: He is oriented to person, place, and time. He appears well-developed and well-nourished.  Neurological: He is alert and oriented to person, place, and time.  Skin:  Small cyst at 9:00 on left. Some erythema.   Tender to palpation.    Psychiatric: He has  a normal mood and affect. His behavior is normal. Judgment and thought content normal.   BP 116/74 mmHg  Pulse 68  Temp(Src) 98.4 F (36.9 C) (Oral)  Resp 20  Wt 129 lb (58.514 kg)      Assessment & Plan:  1. Peri-rectal abscess Unclear if related to pilonidal cyst. Will treat. Patient instructed to call back if condition worsens or does not improve and may consider referral to surgeon.   - amoxicillin-clavulanate (AUGMENTIN) 875-125 MG tablet; Take 1 tablet by mouth 2 (two) times daily.  Dispense: 14 tablet; Refill: 0  2. Pilonidal cyst Stable. No signs of infection.   Lorie Phenix, MD

## 2016-05-27 ENCOUNTER — Other Ambulatory Visit: Payer: Self-pay | Admitting: Family Medicine

## 2016-05-27 DIAGNOSIS — E119 Type 2 diabetes mellitus without complications: Secondary | ICD-10-CM

## 2016-05-30 ENCOUNTER — Other Ambulatory Visit: Payer: Self-pay | Admitting: Family Medicine

## 2016-05-30 DIAGNOSIS — G2581 Restless legs syndrome: Secondary | ICD-10-CM

## 2016-05-30 DIAGNOSIS — E7801 Familial hypercholesterolemia: Secondary | ICD-10-CM

## 2016-05-30 DIAGNOSIS — I10 Essential (primary) hypertension: Secondary | ICD-10-CM

## 2016-08-22 ENCOUNTER — Ambulatory Visit (INDEPENDENT_AMBULATORY_CARE_PROVIDER_SITE_OTHER): Payer: PPO | Admitting: Family Medicine

## 2016-08-22 ENCOUNTER — Encounter: Payer: Self-pay | Admitting: Family Medicine

## 2016-08-22 VITALS — BP 128/82 | HR 60 | Temp 99.2°F | Resp 16 | Wt 132.0 lb

## 2016-08-22 DIAGNOSIS — E7801 Familial hypercholesterolemia: Secondary | ICD-10-CM | POA: Diagnosis not present

## 2016-08-22 DIAGNOSIS — E119 Type 2 diabetes mellitus without complications: Secondary | ICD-10-CM

## 2016-08-22 DIAGNOSIS — Z1159 Encounter for screening for other viral diseases: Secondary | ICD-10-CM

## 2016-08-22 DIAGNOSIS — E538 Deficiency of other specified B group vitamins: Secondary | ICD-10-CM

## 2016-08-22 DIAGNOSIS — I10 Essential (primary) hypertension: Secondary | ICD-10-CM

## 2016-08-22 LAB — POCT UA - MICROALBUMIN: Microalbumin Ur, POC: 20 mg/L

## 2016-08-22 LAB — POCT GLYCOSYLATED HEMOGLOBIN (HGB A1C)
Est. average glucose Bld gHb Est-mCnc: 134
Hemoglobin A1C: 6.3

## 2016-08-22 NOTE — Progress Notes (Signed)
Patient: Jeremiah Meyer Male    DOB: 11/19/1950   65 y.o.   MRN: [id number] Visit Date: 08/22/2016  Today's Provider: Mila Merryonald Marlane Hirschmann, MD   Chief Complaint  Patient presents with  . Hypertension    follow up  . Diabetes    follow up  . Hyperlipidemia    follow up   Subjective:    HPI  This is a previous patient of Dr. Elease HashimotoMaloney present today as new patient to me to establish care and follow up on chronic medical problems.    Diabetes Mellitus Type II, Follow-up:   Lab Results  Component Value Date   HGBA1C 6.3 03/23/2016   HGBA1C 7.6 12/04/2015   HGBA1C 6.8 06/22/2015   Last seen for diabetes 5 months ago.  Management since then includes no changes. He reports fair compliance with treatment. Has not been taking Januvia in the past month die to cost. Has been taking Glipizide instead, which had been discontinued when he started Januvia.  He is not having side effects.  Current symptoms include none and have been stable. Home blood sugar records: fasting range: under 200  Episodes of hypoglycemia? no   Current Insulin Regimen: none Most Recent Eye Exam: 1 year ago Weight trend: fluctuating a bit Prior visit with dietician: no Current diet: in general, an "unhealthy" diet Current exercise: cardiovascular workout on exercise equipment, walking and yard work  ------------------------------------------------------------------------   Hypertension, follow-up:  BP Readings from Last 3 Encounters:  04/04/16 116/74  03/23/16 130/78  01/27/16 110/80    He was last seen for hypertension 5 months ago.  BP at that visit was 130/78. Management since that visit includes no changes.He reports good compliance with treatment. He is not having side effects.  He is exercising. He is not adherent to low salt diet.   Outside blood pressures are not being checked. He is experiencing none.  Patient denies chest pain, chest pressure/discomfort, claudication, dyspnea,  exertional chest pressure/discomfort, fatigue, irregular heart beat, lower extremity edema, near-syncope, orthopnea, palpitations, paroxysmal nocturnal dyspnea, syncope and tachypnea.   Cardiovascular risk factors include advanced age (older than 7555 for men, 5165 for women), diabetes mellitus, dyslipidemia, hypertension and male gender.  Use of agents associated with hypertension: NSAIDS.   ------------------------------------------------------------------------    Lipid/Cholesterol, Follow-up:   Last seen for this 5 months ago.  Management since that visit includes no changes.  Last Lipid Panel:    Component Value Date/Time   CHOL 150 06/22/2015 1032   TRIG 53 06/22/2015 1032   HDL 52 06/22/2015 1032   CHOLHDL 2.9 06/22/2015 1032   LDLCALC 87 06/22/2015 1032    He reports good compliance with treatment. He is not having side effects.   Wt Readings from Last 3 Encounters:  04/04/16 129 lb (58.5 kg)  03/23/16 129 lb (58.5 kg)  01/27/16 129 lb (58.5 kg)    ------------------------------------------------------------------------     No Known Allergies   Current Outpatient Prescriptions:  .  aspirin 81 MG tablet, Take 81 mg by mouth daily., Disp: , Rfl:  .  cyanocobalamin (,VITAMIN B-12,) 1000 MCG/ML injection, INJECT 1 ML EVERY MONTH AS DIRECTED, Disp: 10 mL, Rfl: 5 .  fluticasone (FLONASE) 50 MCG/ACT nasal spray, Place 2 sprays into both nostrils daily., Disp: 16 g, Rfl: 3 .  glipiZIDE (GLUCOTROL XL) 2.5 MG 24 hr tablet, Take 2.5 mg by mouth daily with breakfast., Disp: , Rfl:  .  lisinopril-hydrochlorothiazide (PRINZIDE,ZESTORETIC) 10-12.5 MG tablet, TAKE 1 TABLET  BY MOUTH EVERY DAY, Disp: 90 tablet, Rfl: 3 .  metFORMIN (GLUCOPHAGE) 1000 MG tablet, TAKE 1 TABLET TWICE A DAY, Disp: 180 tablet, Rfl: 1 .  Multiple Vitamin tablet, Take 1 tablet by mouth daily., Disp: , Rfl:  .  ONE TOUCH ULTRA TEST test strip, USE 1 STRIP TO CHECK BLOOD SUGAR ONCE DAILY, Disp: 100 each, Rfl:  1 .  rOPINIRole (REQUIP) 1 MG tablet, TAKE 1 TABLET BY MOUTH AT BEDTIME FOR RESTLESS LEGS, Disp: 90 tablet, Rfl: 1 .  simvastatin (ZOCOR) 20 MG tablet, TAKE 1 TABLET BY MOUTH EVERY DAY, Disp: 90 tablet, Rfl: 2 .  JANUVIA 100 MG tablet, TAKE 1 TABLET (100 MG TOTAL) BY MOUTH DAILY. (Patient not taking: Reported on 08/22/2016), Disp: 90 tablet, Rfl: 1  Review of Systems  Constitutional: Negative for appetite change, chills and fever.  Eyes: Negative for photophobia, pain, discharge, redness, itching and visual disturbance.  Respiratory: Negative for chest tightness, shortness of breath and wheezing.   Cardiovascular: Negative for chest pain and palpitations.  Gastrointestinal: Negative for abdominal pain, nausea and vomiting.  Endocrine: Negative for cold intolerance, heat intolerance, polydipsia, polyphagia and polyuria.    Social History  Substance Use Topics  . Smoking status: Former Games developermoker  . Smokeless tobacco: Never Used     Comment: Quit 1986  . Alcohol use 0.6 oz/week    1 Cans of beer per week     Comment: a drinks in 3-4 month don't drink much.   Objective:   BP 128/82 (BP Location: Left Arm, Patient Position: Sitting, Cuff Size: Normal)   Pulse 60   Temp 99.2 F (37.3 C) (Oral)   Resp 16   Wt 132 lb (59.9 kg)   SpO2 98% Comment: room  air  BMI 22.66 kg/m   Physical Exam  General Appearance:    Alert, cooperative, no distress  Eyes:    PERRL, conjunctiva/corneas clear, EOM's intact       Lungs:     Clear to auscultation bilaterally, respirations unlabored  Heart:    Regular rate and rhythm  Neurologic:   Awake, alert, oriented x 3. No apparent focal neurological           defect.         Results for orders placed or performed in visit on 08/22/16  POCT HgB A1C  Result Value Ref Range   Hemoglobin A1C 6.3    Est. average glucose Bld gHb Est-mCnc 134   POCT UA - Microalbumin  Result Value Ref Range   Microalbumin Ur, POC 20 mg/L   Creatinine, POC n/a mg/dL    Albumin/Creatinine Ratio, Urine, POC n/a        Assessment & Plan:     1. 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  - POCT HgB A1C - POCT UA - Microalbumin  2. Essential (primary) hypertension Well controlled.  Continue current medications.   - Renal function panel  3. Need for hepatitis C screening test  - Hepatitis C antibody  4. B12 deficiency Compliant with oral B12 supplementation - Vitamin B12 - CBC  5. Essential familial hypercholesterolemia He is tolerating simvastatin well with no adverse effects.   - Lipid panel - Hepatic function panel       Mila Merryonald Amayrani Bennick, MD  [facility]

## 2016-08-23 LAB — RENAL FUNCTION PANEL
Albumin: 4.3 g/dL (ref 3.6–4.8)
BUN / CREAT RATIO: 15 (ref 10–24)
BUN: 13 mg/dL (ref 8–27)
CALCIUM: 9.2 mg/dL (ref 8.6–10.2)
CO2: 26 mmol/L (ref 18–29)
CREATININE: 0.89 mg/dL (ref 0.76–1.27)
Chloride: 99 mmol/L (ref 96–106)
GFR calc non Af Amer: 90 mL/min/{1.73_m2} (ref >59)
GFR, EST AFRICAN AMERICAN: 104 mL/min/{1.73_m2} (ref >59)
Glucose: 173 mg/dL — ABNORMAL HIGH (ref 65–99)
Phosphorus: 2.9 mg/dL (ref 2.5–4.5)
Potassium: 4.6 mmol/L (ref 3.5–5.2)
SODIUM: 139 mmol/L (ref 134–144)

## 2016-08-23 LAB — HEPATIC FUNCTION PANEL
ALK PHOS: 61 IU/L (ref 39–117)
ALT: 13 IU/L (ref 0–44)
AST: 18 IU/L (ref 0–40)
BILIRUBIN TOTAL: 0.8 mg/dL (ref 0.0–1.2)
BILIRUBIN, DIRECT: 0.24 mg/dL (ref 0.00–0.40)
Total Protein: 6.5 g/dL (ref 6.0–8.5)

## 2016-08-23 LAB — LIPID PANEL
CHOL/HDL RATIO: 2.9 ratio (ref 0.0–5.0)
Cholesterol, Total: 140 mg/dL (ref 100–199)
HDL: 49 mg/dL (ref >39)
LDL CALC: 79 mg/dL (ref 0–99)
TRIGLYCERIDES: 58 mg/dL (ref 0–149)
VLDL CHOLESTEROL CAL: 12 mg/dL (ref 5–40)

## 2016-08-23 LAB — CBC
HEMATOCRIT: 42.9 % (ref 37.5–51.0)
Hemoglobin: 14.8 g/dL (ref 12.6–17.7)
MCH: 30.9 pg (ref 26.6–33.0)
MCHC: 34.5 g/dL (ref 31.5–35.7)
MCV: 90 fL (ref 79–97)
PLATELETS: 230 10*3/uL (ref 150–379)
RBC: 4.79 x10E6/uL (ref 4.14–5.80)
RDW: 13.4 % (ref 12.3–15.4)
WBC: 5.6 10*3/uL (ref 3.4–10.8)

## 2016-08-23 LAB — HEPATITIS C ANTIBODY: Hep C Virus Ab: <0.1 s/co ratio (ref 0.0–0.9)

## 2016-08-23 LAB — VITAMIN B12: VITAMIN B 12: 316 pg/mL (ref 211–946)

## 2016-09-14 ENCOUNTER — Other Ambulatory Visit: Payer: Self-pay | Admitting: Family Medicine

## 2016-09-14 DIAGNOSIS — E119 Type 2 diabetes mellitus without complications: Secondary | ICD-10-CM

## 2016-09-14 NOTE — Telephone Encounter (Signed)
Please review-aa 

## 2016-10-11 LAB — HM DIABETES EYE EXAM

## 2016-11-16 ENCOUNTER — Other Ambulatory Visit: Payer: Self-pay | Admitting: Family Medicine

## 2016-11-16 DIAGNOSIS — E119 Type 2 diabetes mellitus without complications: Secondary | ICD-10-CM

## 2016-11-23 ENCOUNTER — Other Ambulatory Visit: Payer: Self-pay | Admitting: Family Medicine

## 2016-11-23 DIAGNOSIS — G2581 Restless legs syndrome: Secondary | ICD-10-CM

## 2016-11-23 DIAGNOSIS — E538 Deficiency of other specified B group vitamins: Secondary | ICD-10-CM

## 2016-11-23 NOTE — Telephone Encounter (Signed)
Please review-aa 

## 2017-02-01 ENCOUNTER — Ambulatory Visit (INDEPENDENT_AMBULATORY_CARE_PROVIDER_SITE_OTHER): Payer: PPO

## 2017-02-01 ENCOUNTER — Ambulatory Visit (INDEPENDENT_AMBULATORY_CARE_PROVIDER_SITE_OTHER): Payer: PPO | Admitting: Family Medicine

## 2017-02-01 VITALS — BP 122/78 | HR 72 | Temp 98.1°F | Ht 64.0 in | Wt 133.6 lb

## 2017-02-01 DIAGNOSIS — Z0000 Encounter for general adult medical examination without abnormal findings: Secondary | ICD-10-CM | POA: Diagnosis not present

## 2017-02-01 DIAGNOSIS — E7801 Familial hypercholesterolemia: Secondary | ICD-10-CM | POA: Diagnosis not present

## 2017-02-01 DIAGNOSIS — G2581 Restless legs syndrome: Secondary | ICD-10-CM | POA: Diagnosis not present

## 2017-02-01 DIAGNOSIS — Z125 Encounter for screening for malignant neoplasm of prostate: Secondary | ICD-10-CM

## 2017-02-01 DIAGNOSIS — E119 Type 2 diabetes mellitus without complications: Secondary | ICD-10-CM | POA: Diagnosis not present

## 2017-02-01 DIAGNOSIS — I10 Essential (primary) hypertension: Secondary | ICD-10-CM

## 2017-02-01 DIAGNOSIS — E538 Deficiency of other specified B group vitamins: Secondary | ICD-10-CM | POA: Diagnosis not present

## 2017-02-01 MED ORDER — ROPINIROLE HCL 2 MG PO TABS: 2.0000 mg | ORAL_TABLET | Freq: Every day | ORAL | 2 refills | Status: DC

## 2017-02-01 NOTE — Progress Notes (Signed)
Subjective:   Jeremiah Meyer is a 66 y.o. male who presents for Medicare Annual/Subsequent preventive examination.  Review of Systems:  N/A Cardiac Risk Factors include: advanced age (>74men, >11 women);diabetes mellitus;hypertension;male gender;dyslipidemia;smoking/ tobacco exposure     Objective:    Vitals: BP 122/78 (BP Location: Right Arm)   Pulse 72   Temp 98.1 F (36.7 C) (Oral)   Ht  (1.626 m)   Wt 133 lb 9.6 oz (60.6 kg)   BMI 22.93 kg/m   Body mass index is 22.93 kg/m.  Tobacco History  Smoking Status  . Former Smoker  . Types: Cigarettes  . Quit date: 10/24/1980  Smokeless Tobacco  . Never Used    Comment: Quit 1986     Counseling given: Not Answered   Past Medical History:  Diagnosis Date  . Anemia   . Diabetes mellitus without complication (HCC)   . Hypertension    Past Surgical History:  Procedure Laterality Date  . LUNG SURGERY Left 1985   S/P RECURRENT PNEUMOTHORAX  . VASECTOMY  1981   Family History  Problem Relation Age of Onset  . Cancer Father     liver  . Cancer Brother     stomach and intestine  . Diabetes Brother    History  Sexual Activity  . Sexual activity: Not on file    Outpatient Encounter Prescriptions as of 02/01/2017  Medication Sig  . aspirin 81 MG tablet Take 81 mg by mouth daily.  . cyanocobalamin (,VITAMIN B-12,) 1000 MCG/ML injection INJECT 1 MLS MONTHLY AS DIRECTED  . JANUVIA 100 MG tablet TAKE 1 TABLET (100 MG TOTAL) BY MOUTH DAILY.  Marland Kitchen lisinopril-hydrochlorothiazide (PRINZIDE,ZESTORETIC) 10-12.5 MG tablet TAKE 1 TABLET BY MOUTH EVERY DAY  . metFORMIN (GLUCOPHAGE) 1000 MG tablet TAKE 1 TABLET TWICE A DAY  . Multiple Vitamin tablet Take 1 tablet by mouth daily.  . ONE TOUCH ULTRA TEST test strip USE 1 STRIP TO CHECK BLOOD SUGAR ONCE DAILY  . rOPINIRole (REQUIP) 1 MG tablet TAKE 1 TABLET BY MOUTH AT BEDTIME FOR RESTLESS LEGS  . simvastatin (ZOCOR) 20 MG tablet TAKE 1 TABLET BY MOUTH EVERY DAY  .  fluticasone (FLONASE) 50 MCG/ACT nasal spray Place 2 sprays into both nostrils daily. (Patient not taking: Reported on 02/01/2017)   No facility-administered encounter medications on file as of 02/01/2017.     Activities of Daily Living In your present state of health, do you have any difficulty performing the following activities: 02/01/2017  Hearing? N  Vision? N  Difficulty concentrating or making decisions? N  Walking or climbing stairs? Y  Dressing or bathing? N  Doing errands, shopping? N  Preparing Food and eating ? N  Using the Toilet? N  In the past six months, have you accidently leaked urine? N  Do you have problems with loss of bowel control? N  Managing your Medications? N  Managing your Finances? N  Housekeeping or managing your Housekeeping? N  Some recent data might be hidden    Patient Care Team: Malva Limes, MD as PCP - General (Family Medicine) Thereasa Solo as Consulting Physician (Optometry)   Assessment:     Exercise Activities and Dietary recommendations Current Exercise Habits: The patient does not participate in regular exercise at present, Exercise limited by: Other - see comments (leg pain)  Goals    . Exercise 150 minutes per week (moderate activity)    . Increase water intake  Recommend increasing water intake to 4 glasses a day.      Fall Risk Fall Risk  02/01/2017 01/27/2016 09/04/2015  Falls in the past year? No No No   Depression Screen PHQ 2/9 Scores 02/01/2017 02/01/2017 01/27/2016 09/04/2015  PHQ - 2 Score 0  PHQ- 9 Score 1 - - -    Cognitive Function     6CIT Screen 02/01/2017  What Year? 0 points  What month? 0 points  What time? 0 points  Count back from 20 0 points  Months in reverse 0 points  Repeat phrase 4 points  Total Score 4    Immunization History  Administered Date(s) Administered  . Influenza,inj,Quad PF,36+ Mos 08/25/2014, 09/04/2015  . Pneumococcal Conjugate-13 01/27/2016  . Pneumococcal  Polysaccharide-23 09/09/2013  . Td 10/24/1997   Screening Tests Health Maintenance  Topic Date Due  . FOOT EXAM  09/03/2016  . TETANUS/TDAP  10/24/2026 (Originally 12/30/1969)  . HEMOGLOBIN A1C  02/20/2017  . INFLUENZA VACCINE  05/24/2017  . OPHTHALMOLOGY EXAM  10/11/2017  . PNA vac Low Risk Adult (2 of 2 - PPSV23) 09/09/2018  . COLONOSCOPY  08/25/2020  . Hepatitis C Screening  Completed      Plan:  I have personally reviewed and addressed the Medicare Annual Wellness questionnaire and have noted the following in the patient's chart:  A. Medical and social history B. Use of alcohol, tobacco or illicit drugs  C. Current medications and supplements D. Functional ability and status E.  Nutritional status F.  Physical activity G. Advance directives H. List of other physicians I.  Hospitalizations, surgeries, and ER visits in previous 12 months J.  Vitals K. Screenings such as hearing and vision if needed, cognitive and depression L. Referrals and appointments - none  In addition, I have reviewed and discussed with patient certain preventive protocols, quality metrics, and best practice recommendations. A written personalized care plan for preventive services as well as general preventive health recommendations were provided to patient.  See attached scanned questionnaire for additional information.   Signed,  Hyacinth Meeker, LPN Nurse Health Advisor   MD Recommendations: Pt needs a diabetic foot exam today.  I have reviewed the health advisor's note, was available for consultation, and agree with documentation and plan  Mila Merry, MD

## 2017-02-01 NOTE — Progress Notes (Signed)
Patient: Jeremiah Meyer, Male    DOB: 1951/07/23, 66 y.o.   MRN: [id number] Visit Date: 02/01/2017  Today's Provider: Mila Merry, MD   Chief Complaint  Patient presents with  . Annual Exam  . Hypertension  . Diabetes   Subjective:   -----------------------------------------------------------------  Hypertension, follow-up:  BP Readings from Last 3 Encounters:  02/01/17 122/78  08/22/16 128/82  04/04/16 116/74    He was last seen for hypertension 6 months ago.  BP at that visit was 128/82. Management since that visit includes no changes. He reports good compliance with treatment. He is not having side effects.  He is not exercising. He is not adherent to low salt diet.   Outside blood pressures are not being checked. He is experiencing none.  Patient denies chest pain, chest pressure/discomfort, claudication, dyspnea, exertional chest pressure/discomfort, fatigue, irregular heart beat, lower extremity edema, near-syncope, orthopnea, palpitations, paroxysmal nocturnal dyspnea, syncope and tachypnea.   Cardiovascular risk factors include advanced age (older than 9 for men, 47 for women), diabetes mellitus, hypertension and male gender.  Use of agents associated with hypertension: NSAIDS.     Weight trend: stable Wt Readings from Last 3 Encounters:  02/01/17 133 lb 9.6 oz (60.6 kg)  08/22/16 132 lb (59.9 kg)  04/04/16 129 lb (58.5 kg)    Current diet: on average, 2 meals per day   ------------------------------------------------------------------------  Diabetes Mellitus Type II, Follow-up:   Lab Results  Component Value Date   HGBA1C 6.3 08/22/2016   HGBA1C 6.3 03/23/2016   HGBA1C 7.6 12/04/2015    Last seen for diabetes 6 months ago.  Management since then includes switching back from Glipizide to Januvia. He reports good compliance with treatment. Patient would like to change to something else that is less expensive.  He is not having side  effects.  Current symptoms include paresthesia of the feet and have been stable. Home blood sugar records: fasting range: 130's  Episodes of hypoglycemia? no   Current Insulin Regimen: none Most Recent Eye Exam: <1 year ago  Weight trend: stable Prior visit with dietician: no Current diet: on average, 2 meals per day Current exercise: none  Pertinent Labs:    Component Value Date/Time   CHOL 140 08/22/2016 0927   TRIG 58 08/22/2016 0927   HDL 49 08/22/2016 0927   LDLCALC 79 08/22/2016 0927   CREATININE 0.89 08/22/2016 0927    Wt Readings from Last 3 Encounters:  02/01/17 133 lb 9.6 oz (60.6 kg)  08/22/16 132 lb (59.9 kg)  04/04/16 129 lb (58.5 kg)    ------------------------------------------------------------------------  Follow up of Vitamin B12 Deficiency:  Patient was last seen for this problem 6 months ago and no changes were made. Patient reports good compliance with treatment and  good tolerance.   Restless legs He states he continues to have trouble sleeping due to restless legs. He takes  Requip nightly which he is tolerating well and feels it does help considerably, but not completely   Review of Systems  Constitutional: Negative for appetite change, chills, fatigue and fever.  HENT: Negative for congestion, ear pain, hearing loss, nosebleeds and trouble swallowing.   Eyes: Negative for pain and visual disturbance.  Respiratory: Negative for cough, chest tightness and shortness of breath.   Cardiovascular: Negative for chest pain, palpitations and leg swelling.  Gastrointestinal: Negative for abdominal pain, blood in stool, constipation, diarrhea, nausea and vomiting.  Endocrine: Negative for polydipsia, polyphagia and polyuria.  Genitourinary: Negative for  dysuria and flank pain.  Musculoskeletal: Negative for arthralgias, back pain, joint swelling, myalgias and neck stiffness.  Skin: Negative for color change, rash and wound.  Neurological: Negative for  dizziness, tremors, seizures, speech difficulty, weakness, light-headedness and headaches.  Psychiatric/Behavioral: Negative for behavioral problems, confusion, decreased concentration, dysphoric mood and sleep disturbance. The patient is not nervous/anxious.   All other systems reviewed and are negative.   Social History      He  reports that he quit smoking about 36 years ago. His smoking use included Cigarettes. He has never used smokeless tobacco. He reports that he drinks alcohol. He reports that he uses drugs, including Marijuana.       Social History   Social History  . Marital status: Married    Spouse name: beatrice  . Number of children: 1  . Years of education: N/A   Occupational History  . Retired     Retired 2012   Social History Main Topics  . Smoking status: Former Smoker    Types: Cigarettes    Quit date: 10/24/1980  . Smokeless tobacco: Never Used     Comment: Quit 1986  . Alcohol use Yes     Comment: beer 1-2 monthly  . Drug use: Yes    Types: Marijuana  . Sexual activity: Not on file   Other Topics Concern  . Not on file   Social History Narrative  . No narrative on file    Past Medical History:  Diagnosis Date  . Anemia   . Diabetes mellitus without complication (HCC)   . Hypertension      Patient Active Problem List   Diagnosis Date Noted  . Allergic rhinitis 06/22/2015  . Hemorrhoid 06/12/2015  . Gonalgia 06/12/2015  . Cyst, pilonidal, with abscess 06/12/2015  . Psoriasis 06/12/2015  . Pain in shoulder 06/12/2015  . B12 deficiency 06/01/2015  . Cardiac conduction disorder 04/07/2010  . ED (erectile dysfunction) of organic origin 03/27/2009  . Cervical pain 05/16/2007  . Enthesopathy 05/16/2007  . Essential familial hypercholesterolemia 06/14/2006  . Essential (primary) hypertension 02/13/2006  . Restless leg 05/30/2002  . Controlled diabetes mellitus type II without complication (HCC) 08/09/1999    Past Surgical History:    Procedure Laterality Date  . LUNG SURGERY Left 1985   S/P RECURRENT PNEUMOTHORAX  . VASECTOMY  1981    Family History        Family Status  Relation Status  . Mother Alive  . Father Deceased  . Sister Alive  . Brother Deceased  . Brother Alive  . Brother Alive  . Brother Alive  . Brother Alive        His family history includes Cancer in his brother and father; Diabetes in his brother.     No Known Allergies   Current Outpatient Prescriptions:  .  aspirin 81 MG tablet, Take 81 mg by mouth daily., Disp: , Rfl:  .  cyanocobalamin (,VITAMIN B-12,) 1000 MCG/ML injection, INJECT 1 MLS MONTHLY AS DIRECTED, Disp: 3 mL, Rfl: 1 .  fluticasone (FLONASE) 50 MCG/ACT nasal spray, Place 2 sprays into both nostrils daily. (Patient not taking: Reported on 02/01/2017), Disp: 16 g, Rfl: 3 .  JANUVIA 100 MG tablet, TAKE 1 TABLET (100 MG TOTAL) BY MOUTH DAILY., Disp: 90 tablet, Rfl: 4 .  lisinopril-hydrochlorothiazide (PRINZIDE,ZESTORETIC) 10-12.5 MG tablet, TAKE 1 TABLET BY MOUTH EVERY DAY, Disp: 90 tablet, Rfl: 3 .  metFORMIN (GLUCOPHAGE) 1000 MG tablet, TAKE 1 TABLET TWICE A DAY, Disp: 180 tablet,  Rfl: 3 .  Multiple Vitamin tablet, Take 1 tablet by mouth daily., Disp: , Rfl:  .  ONE TOUCH ULTRA TEST test strip, USE 1 STRIP TO CHECK BLOOD SUGAR ONCE DAILY, Disp: 100 each, Rfl: 1 .  rOPINIRole (REQUIP) 1 MG tablet, TAKE 1 TABLET BY MOUTH AT BEDTIME FOR RESTLESS LEGS, Disp: 90 tablet, Rfl: 4 .  simvastatin (ZOCOR) 20 MG tablet, TAKE 1 TABLET BY MOUTH EVERY DAY, Disp: 90 tablet, Rfl: 2   Patient Care Team: Malva Limes, MD as PCP - General (Family Medicine) Thereasa Solo as Consulting Physician (Optometry)      Objective:   Vitals:  Vital Signs - Last Recorded  Most recent update: 02/01/2017 9:04 AM by Hyacinth Meeker, LPN  BP  829/56 (BP Location: Right Arm)     Pulse  72     Temp  98.1 F (36.7 C) (Oral)     Ht   (1.626 m)     Wt  133 lb 9.6 oz (60.6 kg)       BMI  22.93 kg/m          Physical Exam   General Appearance:    Alert, cooperative, no distress  Eyes:    PERRL, conjunctiva/corneas clear, EOM's intact       Lungs:     Clear to auscultation bilaterally, respirations unlabored  Heart:    Regular rate and rhythm  Neurologic:   Awake, alert, oriented x 3. No apparent focal neurological           defect.       Diabetic Foot Exam - Simple   Simple Foot Form Diabetic Foot exam was performed with the following findings:  Yes 02/01/2017 10:29 AM  Visual Inspection No deformities, no ulcerations, no other skin breakdown bilaterally:  Yes Sensation Testing Intact to touch and monofilament testing bilaterally:  Yes Pulse Check Posterior Tibialis and Dorsalis pulse intact bilaterally:  Yes Comments     Depression Screen PHQ 2/9 Scores 02/01/2017 02/01/2017 01/27/2016 09/04/2015  PHQ - 2 Score 0  PHQ- 9 Score 1 - - -      Assessment & Plan:     1. Essential (primary) hypertension Well controlled.  Continue current medications.   - EKG 12-Lead  2. Controlled type 2 diabetes mellitus without complication, without long-term current use of insulin (HCC) Doing well on current medications, bu anticipate change Januvia to glipizide 2.5 for cost savings when [facility].  - Hemoglobin A1c - EKG 12-Lead  3. Essential familial hypercholesterolemia He is tolerating simvastatin well with no adverse effects.  Consider reducing to  if RLS not controlled on increase does of ropinirol below.  - TSH  4. B12 deficiency   5. Prostate cancer screening  - PSA  6. Restless leg Improved, but not controlled, increase from  to .  - Ferritin - TSH - rOPINIRole (REQUIP) 2 MG tablet; Take 1 tablet (2 mg total) by mouth at bedtime.  Dispense: 90 tablet; Refill: 2   Exercise Activities and Dietary recommendations Goals    . Exercise 150 minutes per week (moderate activity)    . Increase water intake           Recommend increasing water intake to 4 glasses a day.       Immunization History  Administered Date(s) Administered  . Influenza,inj,Quad PF,36+ Mos 08/25/2014, 09/04/2015  . Pneumococcal Conjugate-13 01/27/2016  . Pneumococcal Polysaccharide-23 09/09/2013  . Td 10/24/1997  Health Maintenance  Topic Date Due  . TETANUS/TDAP  02/01/2018 (Originally 10/25/2007)  . HEMOGLOBIN A1C  02/20/2017  . INFLUENZA VACCINE  05/24/2017  . OPHTHALMOLOGY EXAM  10/11/2017  . FOOT EXAM  02/01/2018  . PNA vac Low Risk Adult (2 of 2 - PPSV23) 09/09/2018  . COLONOSCOPY  08/25/2020  . Hepatitis C Screening  Completed     Discussed health benefits of physical activity, and encouraged him to engage in regular exercise appropriate for his age and condition.    --------------------------------------------------------------------  Wants to go back on glipizide 2.5 in place of Januvia when he gets into [facility]  Mila Merry, MD  [facility]

## 2017-02-01 NOTE — Patient Instructions (Signed)
Jeremiah Meyer , Thank you for taking time to come for your Medicare Wellness Visit. I appreciate your ongoing commitment to your health goals. Please review the following plan we discussed and let me know if I can assist you in the future.   Screening recommendations/referrals: Colonoscopy: last done 08/25/10 Recommended yearly ophthalmology/optometry visit for glaucoma screening and checkup Recommended yearly dental visit for hygiene and checkup  Vaccinations: Influenza vaccine: done 06/2016 Pneumococcal vaccine: completed series Tdap vaccine: declined Shingles vaccine: declined    Advanced directives: Packet given today, requested copy once completed.  Next appointment: None  Preventive Care 65 Years and Older, Male Preventive care refers to lifestyle choices and visits with your health care provider that can promote health and wellness. What does preventive care include?  A yearly physical exam. This is also called an annual well check.  Dental exams once or twice a year.  Routine eye exams. Ask your health care provider how often you should have your eyes checked.  Personal lifestyle choices, including:  Daily care of your teeth and gums.  Regular physical activity.  Eating a healthy diet.  Avoiding tobacco and drug use.  Limiting alcohol use.  Practicing safe sex.  Taking low doses of aspirin every day.  Taking vitamin and mineral supplements as recommended by your health care provider. What happens during an annual well check? The services and screenings done by your health care provider during your annual well check will depend on your age, overall health, lifestyle risk factors, and family history of disease. Counseling  Your health care provider may ask you questions about your:  Alcohol use.  Tobacco use.  Drug use.  Emotional well-being.  Home and relationship well-being.  Sexual activity.  Eating habits.  History of falls.  Memory and ability  to understand (cognition).  Work and work Astronomer. Screening  You may have the following tests or measurements:  Height, weight, and BMI.  Blood pressure.  Lipid and cholesterol levels. These may be checked every 5 years, or more frequently if you are over 93 years old.  Skin check.  Lung cancer screening. You may have this screening every year starting at age 9 if you have a 30-pack-year history of smoking and currently smoke or have quit within the past 15 years.  Fecal occult blood test (FOBT) of the stool. You may have this test every year starting at age 36.  Flexible sigmoidoscopy or colonoscopy. You may have a sigmoidoscopy every 5 years or a colonoscopy every 10 years starting at age 38.  Prostate cancer screening. Recommendations will vary depending on your family history and other risks.  Hepatitis C blood test.  Hepatitis B blood test.  Sexually transmitted disease (STD) testing.  Diabetes screening. This is done by checking your blood sugar (glucose) after you have not eaten for a while (fasting). You may have this done every 1-3 years.  Abdominal aortic aneurysm (AAA) screening. You may need this if you are a current or former smoker.  Osteoporosis. You may be screened starting at age 20 if you are at high risk. Talk with your health care provider about your test results, treatment options, and if necessary, the need for more tests. Vaccines  Your health care provider may recommend certain vaccines, such as:  Influenza vaccine. This is recommended every year.  Tetanus, diphtheria, and acellular pertussis (Tdap, Td) vaccine. You may need a Td booster every 10 years.  Zoster vaccine. You may need this after age 74.  Pneumococcal  13-valent conjugate (PCV13) vaccine. One dose is recommended after age 1.  Pneumococcal polysaccharide (PPSV23) vaccine. One dose is recommended after age 68. Talk to your health care provider about which screenings and vaccines  you need and how often you need them. This information is not intended to replace advice given to you by your health care provider. Make sure you discuss any questions you have with your health care provider. Document Released: 11/06/2015 Document Revised: 06/29/2016 Document Reviewed: 08/11/2015 Elsevier Interactive Patient Education  2017 [employer].  Fall Prevention in the Home Falls can cause injuries. They can happen to people of all ages. There are many things you can do to make your home safe and to help prevent falls. What can I do on the outside of my home?  Regularly fix the edges of walkways and driveways and fix any cracks.  Remove anything that might make you trip as you walk through a door, such as a raised step or threshold.  Trim any bushes or trees on the path to your home.  Use bright outdoor lighting.  Clear any walking paths of anything that might make someone trip, such as rocks or tools.  Regularly check to see if handrails are loose or broken. Make sure that both sides of any steps have handrails.  Any raised decks and porches should have guardrails on the edges.  Have any leaves, snow, or ice cleared regularly.  Use sand or salt on walking paths during winter.  Clean up any spills in your garage right away. This includes oil or grease spills. What can I do in the bathroom?  Use night lights.  Install grab bars by the toilet and in the tub and shower. Do not use towel bars as grab bars.  Use non-skid mats or decals in the tub or shower.  If you need to sit down in the shower, use a plastic, non-slip stool.  Keep the floor dry. Clean up any water that spills on the floor as soon as it happens.  Remove soap buildup in the tub or shower regularly.  Attach bath mats securely with double-sided non-slip rug tape.  Do not have throw rugs and other things on the floor that can make you trip. What can I do in the bedroom?  Use night lights.  Make sure  that you have a light by your bed that is easy to reach.  Do not use any sheets or blankets that are too big for your bed. They should not hang down onto the floor.  Have a firm chair that has side arms. You can use this for support while you get dressed.  Do not have throw rugs and other things on the floor that can make you trip. What can I do in the kitchen?  Clean up any spills right away.  Avoid walking on wet floors.  Keep items that you use a lot in easy-to-reach places.  If you need to reach something above you, use a strong step stool that has a grab bar.  Keep electrical cords out of the way.  Do not use floor polish or wax that makes floors slippery. If you must use wax, use non-skid floor wax.  Do not have throw rugs and other things on the floor that can make you trip. What can I do with my stairs?  Do not leave any items on the stairs.  Make sure that there are handrails on both sides of the stairs and use them. Fix  handrails that are broken or loose. Make sure that handrails are as long as the stairways.  Check any carpeting to make sure that it is firmly attached to the stairs. Fix any carpet that is loose or worn.  Avoid having throw rugs at the top or bottom of the stairs. If you do have throw rugs, attach them to the floor with carpet tape.  Make sure that you have a light switch at the top of the stairs and the bottom of the stairs. If you do not have them, ask someone to add them for you. What else can I do to help prevent falls?  Wear shoes that:  Do not have high heels.  Have rubber bottoms.  Are comfortable and fit you well.  Are closed at the toe. Do not wear sandals.  If you use a stepladder:  Make sure that it is fully opened. Do not climb a closed stepladder.  Make sure that both sides of the stepladder are locked into place.  Ask someone to hold it for you, if possible.  Clearly mark and make sure that you can see:  Any grab bars or  handrails.  First and last steps.  Where the edge of each step is.  Use tools that help you move around (mobility aids) if they are needed. These include:  Canes.  Walkers.  Scooters.  Crutches.  Turn on the lights when you go into a dark area. Replace any light bulbs as soon as they burn out.  Set up your furniture so you have a clear path. Avoid moving your furniture around.  If any of your floors are uneven, fix them.  If there are any pets around you, be aware of where they are.  Review your medicines with your doctor. Some medicines can make you feel dizzy. This can increase your chance of falling. Ask your doctor what other things that you can do to help prevent falls. This information is not intended to replace advice given to you by your health care provider. Make sure you discuss any questions you have with your health care provider. Document Released: 08/06/2009 Document Revised: 03/17/2016 Document Reviewed: 11/14/2014 Elsevier Interactive Patient Education  2017 [employer].

## 2017-02-02 LAB — PSA: Prostate Specific Ag, Serum: 1.7 ng/mL (ref 0.0–4.0)

## 2017-02-02 LAB — HEMOGLOBIN A1C
ESTIMATED AVERAGE GLUCOSE: 140 mg/dL
Hgb A1c MFr Bld: 6.5 % — ABNORMAL HIGH (ref 4.8–5.6)

## 2017-02-02 LAB — FERRITIN: Ferritin: 56 ng/mL (ref 30–400)

## 2017-02-02 LAB — TSH: TSH: 1.98 u[IU]/mL (ref 0.450–4.500)

## 2017-02-02 NOTE — Progress Notes (Signed)
Advised  ED 

## 2017-02-19 ENCOUNTER — Other Ambulatory Visit: Payer: Self-pay | Admitting: Family Medicine

## 2017-02-19 DIAGNOSIS — E7801 Familial hypercholesterolemia: Secondary | ICD-10-CM

## 2017-03-11 ENCOUNTER — Other Ambulatory Visit: Payer: Self-pay | Admitting: Family Medicine

## 2017-05-06 ENCOUNTER — Other Ambulatory Visit: Payer: Self-pay | Admitting: Family Medicine

## 2017-05-06 DIAGNOSIS — E538 Deficiency of other specified B group vitamins: Secondary | ICD-10-CM

## 2017-05-17 ENCOUNTER — Other Ambulatory Visit: Payer: Self-pay | Admitting: Family Medicine

## 2017-05-17 DIAGNOSIS — I10 Essential (primary) hypertension: Secondary | ICD-10-CM

## 2017-05-22 ENCOUNTER — Ambulatory Visit (INDEPENDENT_AMBULATORY_CARE_PROVIDER_SITE_OTHER): Payer: PPO | Admitting: Family Medicine

## 2017-05-22 ENCOUNTER — Encounter: Payer: Self-pay | Admitting: Family Medicine

## 2017-05-22 VITALS — BP 102/70 | HR 80 | Temp 98.1°F | Resp 16 | Wt 132.0 lb

## 2017-05-22 DIAGNOSIS — L821 Other seborrheic keratosis: Secondary | ICD-10-CM | POA: Diagnosis not present

## 2017-05-22 DIAGNOSIS — F39 Unspecified mood [affective] disorder: Secondary | ICD-10-CM | POA: Diagnosis not present

## 2017-05-22 DIAGNOSIS — Z862 Personal history of diseases of the blood and blood-forming organs and certain disorders involving the immune mechanism: Secondary | ICD-10-CM | POA: Insufficient documentation

## 2017-05-22 MED ORDER — CITALOPRAM HYDROBROMIDE 20 MG PO TABS: ORAL_TABLET | ORAL | 3 refills | Status: DC

## 2017-05-22 NOTE — Progress Notes (Signed)
Patient: Jeremiah DropSteven D Fonder Male    DOB: 04/24/1951   66 y.o.   MRN: [id number] Visit Date: 05/22/2017  Today's Provider: Mila Merryonald Tamatha Gadbois, MD   No chief complaint on file.  Subjective:    Anxiety  Presents for initial visit. Episode onset: worsening over the last month. The problem has been gradually worsening. Symptoms include depressed mood, malaise, nervous/anxious behavior and restlessness. Patient reports no chest pain (is feeling chest pressure), compulsions, confusion, decreased concentration, dizziness, dry mouth, excessive worry, feeling of choking, hyperventilation, insomnia, irritability, muscle tension, nausea, obsessions, palpitations, panic, shortness of breath or suicidal ideas. The quality of sleep is good.    Depression         Associated symptoms include restlessness.  Associated symptoms include no decreased concentration, no fatigue, no helplessness, no hopelessness, does not have insomnia, no decreased interest, no appetite change and no suicidal ideas.  Treatments tried: Prozac, with relief. Pt would like to restart this medication.  Past medical history includes anxiety.    Pt is struggling transitioning into retirement 5 years ago. He states, "I don't have any social activities". He also states that his wife has been complaining more since he has been retired, and he "doesn't want to hear it".  He states he is not really interested in starting any new activities or traveling and gets irritated when his wife encouraged him to do so. He doesn't really depressed and is not anxious when he is by himself. He mainly wants something to keep from getting so irritated.   He took Prozac for a few years back in the 1990s for anxiety and irritability, but he's not sure it really helped, but doesn't have much issue with these symptoms since retirement. He does have a lot of trouble with restless legs and thinks Prozac may have aggravated. He sometimes smokes a small amount of  marijuana just before bed and states it completely alleviates restless legs. He states he was prescribed Valium years ago when he had anxiety related to some type of chest pain. He states it worked well but he doesn't want to take anything habit forming.      [facility] 05/22/2017 02/01/2017 02/01/2017  Decreased Interest 1 1 1   Down, Depressed, Hopeless 1 0 0  PHQ - 2 Score 2 1 1   Altered sleeping - 0 -  Tired, decreased energy - 0 -  Change in appetite - 0 -  Feeling bad or failure about yourself  - 0 -  Trouble concentrating - 0 -  Moving slowly or fidgety/restless - 0 -  Suicidal thoughts - 0 -  PHQ-9 Score - 1 -   GAD 7 : Generalized Anxiety Score 05/22/2017  Nervous, Anxious, on Edge 1  Control/stop worrying 0  Worry too much - different things 0  Trouble relaxing 0  Restless 0  Easily annoyed or irritable 1  Afraid - awful might happen 0  Total GAD 7 Score 2  Anxiety Difficulty Not difficult at all     Pt also has "spots" on his back that he would like examined.   No Known Allergies   Current Outpatient Prescriptions:  .  aspirin 81 MG tablet, Take 81 mg by mouth daily., Disp: , Rfl:  .  cyanocobalamin (,VITAMIN B-12,) 1000 MCG/ML injection, INJECT 1 MLS MONTHLY AS DIRECTED, Disp: 3 mL, Rfl: 4 .  fluticasone (FLONASE) 50 MCG/ACT nasal spray, Place 2 sprays into both nostrils daily. (Patient not taking: Reported  on 02/01/2017), Disp: 16 g, Rfl: 3 .  glipiZIDE (GLUCOTROL XL) 2.5 MG 24 hr tablet, TAKE 1 TABLET (2.5 MG TOTAL) BY MOUTH DAILY WITH BREAKFAST., Disp: 30 tablet, Rfl: 5 .  JANUVIA 100 MG tablet, TAKE 1 TABLET (100 MG TOTAL) BY MOUTH DAILY., Disp: 90 tablet, Rfl: 4 .  lisinopril-hydrochlorothiazide (PRINZIDE,ZESTORETIC) 10-12.5 MG tablet, TAKE 1 TABLET BY MOUTH EVERY DAY, Disp: 90 tablet, Rfl: 2 .  metFORMIN (GLUCOPHAGE) 1000 MG tablet, TAKE 1 TABLET TWICE A DAY, Disp: 180 tablet, Rfl: 3 .  Multiple Vitamin tablet, Take 1 tablet by mouth daily., Disp: ,  Rfl:  .  ONE TOUCH ULTRA TEST test strip, USE 1 STRIP TO CHECK BLOOD SUGAR ONCE DAILY, Disp: 100 each, Rfl: 1 .  rOPINIRole (REQUIP) 2 MG tablet, Take 1 tablet (2 mg total) by mouth at bedtime., Disp: 90 tablet, Rfl: 2 .  simvastatin (ZOCOR) 20 MG tablet, TAKE 1 TABLET BY MOUTH EVERY DAY, Disp: 90 tablet, Rfl: 2  Review of Systems  Constitutional: Negative for appetite change, fatigue and irritability.  Respiratory: Negative for shortness of breath.   Cardiovascular: Negative for chest pain (is feeling chest pressure) and palpitations.  Gastrointestinal: Negative for nausea.  Neurological: Negative for dizziness.  Psychiatric/Behavioral: Positive for depression. Negative for confusion, decreased concentration and suicidal ideas. The patient is nervous/anxious. The patient does not have insomnia.     Social History  Substance Use Topics  . Smoking status: Former Smoker    Types: Cigarettes    Quit date: 10/24/1980  . Smokeless tobacco: Never Used     Comment: Quit 1986  . Alcohol use Yes     Comment: beer 1-2 monthly   Social History   Social History  . Marital status: Married    Spouse name: Diamond Nickel  . Number of children: 1  . Years of education: N/A   Occupational History  . 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 Smoking status: Former Smoker    Types: Cigarettes    Quit date: 10/24/1980  . Smokeless tobacco: Never Used     Comment: Quit 1986  . Alcohol use Yes     Comment: beer 1-2 monthly  . Drug use: Yes    Types: Marijuana  . Sexual activity: Not Asked   Other Topics Concern  . None   Social History Narrative   Has one son who lives in [place]     Objective:   BP 102/70 (BP Location: Left Arm, Patient Position: Sitting, Cuff Size: Normal)   Pulse 80   Temp 98.1 F (36.7 C) (Oral)   Resp 16   Wt 132 lb (59.9 kg)   BMI 22.66 kg/m     Physical Exam  General appearance: alert, well developed, well  nourished, cooperative and in no distress Head: Normocephalic, without obvious abnormality, atraumatic Respiratory: Respirations even and unlabored, normal respiratory rate Extremities: No gross deformities Skin: Skin color, texture, turgor normal. Several benign appearing seborrheic keratosis on back Psych: Appropriate mood and affect. Neurologic: Mental status: Alert, oriented to person, place, and time, thought content appropriate.     Assessment & Plan:     1. Mood disorder (HCC)  - citalopram (CELEXA) 20 MG tablet; 1/2 daily for six days, then increase to 1 tablet dialy  Dispense: 30 tablet; Refill: 3  2. Seborrheic keratoses  Over half of this 25 minute visit were spent in counseling and coordinating care of multiple medical problems.  Return in about 1 month (around 06/22/2017).      The entirety of the information documented in the History of Present Illness, Review of Systems and Physical Exam were personally obtained by me. Portions of this information were initially documented by Allene DillonEmily Drozdowski, CMA and reviewed by me for thoroughness and accuracy.    Mila Merryonald Jalayiah Bibian, MD  [facility]

## 2017-06-19 ENCOUNTER — Encounter: Payer: Self-pay | Admitting: Family Medicine

## 2017-06-19 ENCOUNTER — Ambulatory Visit (INDEPENDENT_AMBULATORY_CARE_PROVIDER_SITE_OTHER): Payer: PPO | Admitting: Family Medicine

## 2017-06-19 VITALS — BP 110/80 | HR 65 | Temp 98.0°F | Resp 16 | Ht 64.0 in | Wt 129.0 lb

## 2017-06-19 DIAGNOSIS — Z23 Encounter for immunization: Secondary | ICD-10-CM | POA: Diagnosis not present

## 2017-06-19 DIAGNOSIS — F39 Unspecified mood [affective] disorder: Secondary | ICD-10-CM

## 2017-06-19 DIAGNOSIS — E119 Type 2 diabetes mellitus without complications: Secondary | ICD-10-CM

## 2017-06-19 LAB — POCT GLYCOSYLATED HEMOGLOBIN (HGB A1C)
Est. average glucose Bld gHb Est-mCnc: 140
Hemoglobin A1C: 6.5

## 2017-06-19 MED ORDER — CITALOPRAM HYDROBROMIDE 20 MG PO TABS: 20.0000 mg | ORAL_TABLET | Freq: Every day | ORAL | 2 refills | Status: DC

## 2017-06-19 NOTE — Progress Notes (Signed)
Patient: Jeremiah Meyer Male    DOB: 02/25/1951   66 y.o.   MRN: [id number] Visit Date: 06/19/2017  Today's Provider: Mila Merry, MD   Chief Complaint  Patient presents with  . Follow-up   Subjective:    HPI  Mood disorder  From 05/22/2017-started citalopram (CELEXA) 20 MG tablet.  Patient stated that he is doing well on citalopram. He had some nausea initially which has resolved since he started taking it at night. He states he is much less irritable since starting medications and he feels it is working as intended. He wishes to continue same dose of medications.   He is also due for follow up diabetes. Is doing well on current medications. No hypoglycemia. Fasting sugars mostly in the low 100s. He states he is not taking glipizide at this time, but will when the Januvia runs out due to being in the doughnut hole. His last a1c in [place] was 6.5%  No Known Allergies     Current Outpatient Prescriptions:  .  aspirin 81 MG tablet, Take 81 mg by mouth daily., Disp: , Rfl:  .  citalopram (CELEXA) 20 MG tablet, Take 1 tablet (20 mg total) by mouth daily., Disp: 90 tablet, Rfl: 2 .  cyanocobalamin (,VITAMIN B-12,) 1000 MCG/ML injection, INJECT 1 MLS MONTHLY AS DIRECTED, Disp: 3 mL, Rfl: 4 .  fluticasone (FLONASE) 50 MCG/ACT nasal spray, Place 2 sprays into both nostrils daily., Disp: 16 g, Rfl: 3 .  glipiZIDE (GLUCOTROL XL) 2.5 MG 24 hr tablet, TAKE 1 TABLET (2.5 MG TOTAL) BY MOUTH DAILY WITH BREAKFAST., Disp: 30 tablet, Rfl: 5 (NOT TAKING) .  JANUVIA 100 MG tablet, TAKE 1 TABLET (100 MG TOTAL) BY MOUTH DAILY., Disp: 90 tablet, Rfl: 4 .  lisinopril-hydrochlorothiazide (PRINZIDE,ZESTORETIC) 10-12.5 MG tablet, TAKE 1 TABLET BY MOUTH EVERY DAY, Disp: 90 tablet, Rfl: 2 .  metFORMIN (GLUCOPHAGE) 1000 MG tablet, TAKE 1 TABLET TWICE A DAY, Disp: 180 tablet, Rfl: 3 .  Multiple Vitamin tablet, Take 1 tablet by mouth daily., Disp: , Rfl:  .  ONE TOUCH ULTRA TEST test strip, USE 1 STRIP  TO CHECK BLOOD SUGAR ONCE DAILY, Disp: 100 each, Rfl: 1 .  rOPINIRole (REQUIP) 2 MG tablet, Take 1 tablet (2 mg total) by mouth at bedtime., Disp: 90 tablet, Rfl: 2 .  simvastatin (ZOCOR) 20 MG tablet, TAKE 1 TABLET BY MOUTH EVERY DAY, Disp: 90 tablet, Rfl: 2  Review of Systems  Constitutional: Negative for appetite change, chills and fever.  Respiratory: Negative for chest tightness, shortness of breath and wheezing.   Cardiovascular: Negative for chest pain and palpitations.  Gastrointestinal: Negative for abdominal pain, nausea and vomiting.    Social History  Substance Use Topics  . Smoking status: Former Smoker    Types: Cigarettes    Quit date: 10/24/1980  . Smokeless tobacco: Never Used     Comment: Quit 1986  . Alcohol use Yes     Comment: beer 1-2 monthly   Objective:   BP 110/80 (BP Location: Right Arm, Patient Position: Sitting, Cuff Size: Normal)   Pulse 65   Temp 98 F (36.7 C) (Oral)   Resp 16   Ht 5\' 4"  (1.626 m)   Wt 129 lb (58.5 kg)   SpO2 98%   BMI 22.14 kg/m     Physical Exam  General appearance: alert, well developed, well nourished, cooperative and in no distress Head: Normocephalic, without obvious abnormality, atraumatic Respiratory: Respirations even and unlabored,  normal respiratory rate Extremities: No gross deformities Skin: Skin color, texture, turgor normal. No rashes seen  Psych: Appropriate mood and affect. Neurologic: Mental status: Alert, oriented to person, place, and time, thought content appropriate.   HgbA1c=6.5%    Assessment & Plan:     1. Mood disorder (HCC) Much better on citalopram.  - citalopram (CELEXA) 20 MG tablet; Take 1 tablet (20 mg total) by mouth daily.  Dispense: 90 tablet; Refill: 2  2. Need for influenza vaccination  - Flu vaccine HIGH DOSE PF  3. Controlled type 2 diabetes mellitus without complication, without long-term current use of insulin (HCC) Continue current medications.    Follow up 3-4 months.          Mila Merry, MD  [facility]

## 2017-07-04 ENCOUNTER — Telehealth: Payer: Self-pay | Admitting: Family Medicine

## 2017-07-04 DIAGNOSIS — L409 Psoriasis, unspecified: Secondary | ICD-10-CM

## 2017-07-04 NOTE — Telephone Encounter (Signed)
Please advise 

## 2017-07-04 NOTE — Telephone Encounter (Signed)
Pt would like a prescription for diprolene af augmented bete methadone (cream .05%  He uses this for psoriasis   [facility]   Thanks teri

## 2017-07-05 MED ORDER — BETAMETHASONE DIPROPIONATE 0.05 % EX CREA: TOPICAL_CREAM | Freq: Every day | CUTANEOUS | 1 refills | Status: AC

## 2017-07-28 ENCOUNTER — Other Ambulatory Visit: Payer: Self-pay | Admitting: Family Medicine

## 2017-07-28 MED ORDER — GLIPIZIDE ER 2.5 MG PO TB24: 2.5000 mg | ORAL_TABLET | Freq: Every day | ORAL | 4 refills | Status: DC

## 2017-07-28 NOTE — Telephone Encounter (Signed)
[facility] faxed refill request for the following medications:  glipiZIDE (GLUCOTROL XL) 2.5 MG 24 hr tablet 90 day supply   Last Rx: 03/12/17 with 5 refills but it was a 30 day supply LOV: 06/19/17 NOV: 10/09/17 Please advise. Thanks TNP

## 2017-07-28 NOTE — Telephone Encounter (Signed)
Requesting 90 day supply.

## 2017-10-09 ENCOUNTER — Encounter: Payer: Self-pay | Admitting: Family Medicine

## 2017-10-09 ENCOUNTER — Ambulatory Visit: Payer: PPO | Admitting: Family Medicine

## 2017-10-09 VITALS — BP 124/84 | HR 116 | Temp 97.7°F | Resp 16 | Ht 64.0 in | Wt 131.0 lb

## 2017-10-09 DIAGNOSIS — F39 Unspecified mood [affective] disorder: Secondary | ICD-10-CM | POA: Diagnosis not present

## 2017-10-09 DIAGNOSIS — I10 Essential (primary) hypertension: Secondary | ICD-10-CM | POA: Diagnosis not present

## 2017-10-09 DIAGNOSIS — Z862 Personal history of diseases of the blood and blood-forming organs and certain disorders involving the immune mechanism: Secondary | ICD-10-CM

## 2017-10-09 DIAGNOSIS — E538 Deficiency of other specified B group vitamins: Secondary | ICD-10-CM | POA: Diagnosis not present

## 2017-10-09 DIAGNOSIS — E119 Type 2 diabetes mellitus without complications: Secondary | ICD-10-CM | POA: Diagnosis not present

## 2017-10-09 DIAGNOSIS — E7801 Familial hypercholesterolemia: Secondary | ICD-10-CM

## 2017-10-09 LAB — POCT GLYCOSYLATED HEMOGLOBIN (HGB A1C)
ESTIMATED AVERAGE GLUCOSE: 128
Hemoglobin A1C: 6.1

## 2017-10-09 LAB — POCT UA - MICROALBUMIN: Microalbumin Ur, POC: 50 mg/L

## 2017-10-09 NOTE — Progress Notes (Signed)
Patient: Jeremiah Meyer Male    DOB: 09/22/1951   66 y.o.   MRN: [id number] Visit Date: 10/09/2017  Today's Provider: Mila Merryonald Fisher, MD   No chief complaint on file.  Subjective:    HPI   Diabetes Mellitus Type II, Follow-up:   Lab Results  Component Value Date   HGBA1C 6.5 06/19/2017   HGBA1C 6.5 (H) 02/01/2017   HGBA1C 6.3 08/22/2016   Last seen for diabetes 4 months ago.  Management since then includes; no changes. He reports good compliance with treatment. He is not having side effects. none Current symptoms include none and have been unchanged. Home blood sugar records: fasting range: not really checking right now  Episodes of hypoglycemia? no   Current Insulin Regimen: n/a  Weight trend: stable Prior visit with dietician: no Current diet: in general, an "unhealthy" diet Current exercise: none  ----------------------------------------------------------------    Mood disorder (HCC) Last seen 06/19/2017 and was doing much better since starting citalopram. He continues on citalopram and feels it is still working well without significant adverse effects. It does make him yawn more, so he takes in in the evening.   Follow up hypertension He continues on lisinopril-hctz which he is tolerating well. Home blood pressures are usually normal. No chest pains or palpitations.  BMP Latest Ref Rng & Units 08/22/2016 06/22/2015  Glucose 65 - 99 mg/dL 962(X173(H) 528(U167(H)  BUN 8 - 27 mg/dL 13 14  Creatinine 1.320.76 - 1.27 mg/dL 4.400.89 1.020.89  BUN/Creat Ratio 10 - 24 15 16   Sodium 134 - 144 mmol/L 139 138  Potassium 3.5 - 5.2 mmol/L 4.6 4.5  Chloride 96 - 106 mmol/L 99 98  CO2 18 - 29 mmol/L 26 25  Calcium 8.6 - 10.2 mg/dL 9.2 9.2   Wt Readings from Last 3 Encounters:  06/19/17 129 lb (58.5 kg)  05/22/17 132 lb (59.9 kg)  02/01/17 133 lb 9.6 oz (60.6 kg)      Follow up hyperlipidemia.  He continues on simvastatin which he is tolerating well. Having no adverse effects  from medication.  Lab Results  Component Value Date   CHOL 140 08/22/2016   HDL 49 08/22/2016   LDLCALC 79 08/22/2016   TRIG 58 08/22/2016   CHOLHDL 2.9 08/22/2016   He avoid high fat foods in diet.   No Known Allergies   Current Outpatient Medications:  .  aspirin 81 MG tablet, Take 81 mg by mouth daily., Disp: , Rfl:  .  betamethasone dipropionate (DIPROLENE) 0.05 % cream, Apply topically daily., Disp: 45 g, Rfl: 1 .  citalopram (CELEXA) 20 MG tablet, Take 1 tablet (20 mg total) by mouth daily., Disp: 90 tablet, Rfl: 2 .  cyanocobalamin (,VITAMIN B-12,) 1000 MCG/ML injection, INJECT 1 MLS MONTHLY AS DIRECTED, Disp: 3 mL, Rfl: 4 .  fluticasone (FLONASE) 50 MCG/ACT nasal spray, Place 2 sprays into both nostrils daily., Disp: 16 g, Rfl: 3 .  glipiZIDE (GLUCOTROL XL) 2.5 MG 24 hr tablet, Take 1 tablet (2.5 mg total) by mouth daily with breakfast., Disp: 90 tablet, Rfl: 4 .  JANUVIA 100 MG tablet, TAKE 1 TABLET (100 MG TOTAL) BY MOUTH DAILY., Disp: 90 tablet, Rfl: 4 .  lisinopril-hydrochlorothiazide (PRINZIDE,ZESTORETIC) 10-12.5 MG tablet, TAKE 1 TABLET BY MOUTH EVERY DAY, Disp: 90 tablet, Rfl: 2 .  metFORMIN (GLUCOPHAGE) 1000 MG tablet, TAKE 1 TABLET TWICE A DAY, Disp: 180 tablet, Rfl: 3 .  Multiple Vitamin tablet, Take 1 tablet by mouth daily., Disp: ,  Rfl:  .  ONE TOUCH ULTRA TEST test strip, USE 1 STRIP TO CHECK BLOOD SUGAR ONCE DAILY, Disp: 100 each, Rfl: 1 .  rOPINIRole (REQUIP) 2 MG tablet, Take 1 tablet (2 mg total) by mouth at bedtime., Disp: 90 tablet, Rfl: 2 .  simvastatin (ZOCOR) 20 MG tablet, TAKE 1 TABLET BY MOUTH EVERY DAY, Disp: 90 tablet, Rfl: 2  Review of Systems  Constitutional: Negative for appetite change, chills and fever.  Respiratory: Negative for chest tightness, shortness of breath and wheezing.   Cardiovascular: Negative for chest pain and palpitations.  Gastrointestinal: Negative for abdominal pain, nausea and vomiting.    Social History   Tobacco Use    . Smoking status: Former Smoker    Types: Cigarettes    Last attempt to quit: 10/24/1980    Years since quitting: 36.9  . Smokeless tobacco: Never Used  . Tobacco comment: Quit 1986  Substance Use Topics  . Alcohol use: Yes    Comment: beer 1-2 monthly   Objective:   BP 124/84 (BP Location: Right Arm, Patient Position: Sitting, Cuff Size: Normal)   Pulse (!) 116   Temp 97.7 F (36.5 C) (Oral)   Resp 16   Ht 5\' 4"  (1.626 m)   Wt 131 lb (59.4 kg)   SpO2 97%   BMI 22.49 kg/m     Physical Exam   General Appearance:    Alert, cooperative, no distress  Eyes:    PERRL, conjunctiva/corneas clear, EOM's intact       Lungs:     Clear to auscultation bilaterally, respirations unlabored  Heart:    Regular rate and rhythm  Neurologic:   Awake, alert, oriented x 3. No apparent focal neurological           defect.       Results for orders placed or performed in visit on 10/09/17  POCT glycosylated hemoglobin (Hb A1C)  Result Value Ref Range   Hemoglobin A1C 6.1    Est. average glucose Bld gHb Est-mCnc 128   POCT UA - Microalbumin  Result Value Ref Range   Microalbumin Ur, POC 50 mg/L        Assessment & Plan:     1. Controlled type 2 diabetes mellitus without complication, without long-term current use of insulin (HCC) Doing well current medications.  - POCT glycosylated hemoglobin (Hb A1C) - POCT UA - Microalbumin  2. Mood disorder (HCC) Doing well with citalopram  3. Essential (primary) hypertension Well controlled.  Continue current medications.   - Renal function panel  4. Essential familial hypercholesterolemia He is tolerating simvastatin well with no adverse effects.   - Lipid panel  5. B12 deficiency Continues monthly b12 injections administered by his spouse. Last injection was at the beginning of the month.  - Vitamin B12  6. History of pernicious anemia Resolved on b12 injections.       Mila Merryonald Fisher, MD  [facility]

## 2017-10-10 LAB — RENAL FUNCTION PANEL
Albumin: 4.2 g/dL (ref 3.6–5.1)
BUN: 17 mg/dL (ref 7–25)
CALCIUM: 9.4 mg/dL (ref 8.6–10.3)
CHLORIDE: 102 mmol/L (ref 98–110)
CO2: 32 mmol/L (ref 20–32)
Creat: 1.01 mg/dL (ref 0.70–1.25)
GLUCOSE: 162 mg/dL — AB (ref 65–99)
PHOSPHORUS: 3.2 mg/dL (ref 2.1–4.3)
Potassium: 4.6 mmol/L (ref 3.5–5.3)
Sodium: 140 mmol/L (ref 135–146)

## 2017-10-10 LAB — LIPID PANEL
CHOL/HDL RATIO: 3.6 (calc) (ref <5.0)
CHOLESTEROL: 150 mg/dL (ref <200)
HDL: 42 mg/dL (ref >40)
LDL CHOLESTEROL (CALC): 84 mg/dL
NON-HDL CHOLESTEROL (CALC): 108 mg/dL (ref <130)
Triglycerides: 143 mg/dL (ref <150)

## 2017-10-10 LAB — VITAMIN B12: VITAMIN B 12: 446 pg/mL (ref 200–1100)

## 2017-10-25 ENCOUNTER — Other Ambulatory Visit: Payer: Self-pay | Admitting: Family Medicine

## 2017-10-25 DIAGNOSIS — E119 Type 2 diabetes mellitus without complications: Secondary | ICD-10-CM

## 2017-10-25 NOTE — Telephone Encounter (Signed)
Pharmacy requesting refills. Thanks!  

## 2017-11-01 ENCOUNTER — Other Ambulatory Visit: Payer: Self-pay | Admitting: Family Medicine

## 2017-11-01 DIAGNOSIS — E119 Type 2 diabetes mellitus without complications: Secondary | ICD-10-CM

## 2017-11-09 ENCOUNTER — Other Ambulatory Visit: Payer: Self-pay | Admitting: Family Medicine

## 2017-11-09 DIAGNOSIS — E7801 Familial hypercholesterolemia: Secondary | ICD-10-CM

## 2017-12-20 DIAGNOSIS — H3561 Retinal hemorrhage, right eye: Secondary | ICD-10-CM | POA: Diagnosis not present

## 2017-12-20 DIAGNOSIS — E113293 Type 2 diabetes mellitus with mild nonproliferative diabetic retinopathy without macular edema, bilateral: Secondary | ICD-10-CM | POA: Diagnosis not present

## 2017-12-20 DIAGNOSIS — H2513 Age-related nuclear cataract, bilateral: Secondary | ICD-10-CM | POA: Diagnosis not present

## 2017-12-20 LAB — HM DIABETES EYE EXAM

## 2017-12-25 ENCOUNTER — Ambulatory Visit (INDEPENDENT_AMBULATORY_CARE_PROVIDER_SITE_OTHER): Payer: PPO | Admitting: Family Medicine

## 2017-12-25 ENCOUNTER — Ambulatory Visit
Admission: RE | Admit: 2017-12-25 | Discharge: 2017-12-25 | Disposition: A | Discharge status: Home / Self Care | Payer: PPO | Source: Ambulatory Visit | Attending: Family Medicine | Admitting: Family Medicine

## 2017-12-25 ENCOUNTER — Encounter: Payer: Self-pay | Admitting: Family Medicine

## 2017-12-25 VITALS — BP 130/80 | HR 66 | Temp 97.7°F | Resp 16 | Wt 136.0 lb

## 2017-12-25 DIAGNOSIS — R112 Nausea with vomiting, unspecified: Secondary | ICD-10-CM | POA: Insufficient documentation

## 2017-12-25 DIAGNOSIS — K5900 Constipation, unspecified: Secondary | ICD-10-CM | POA: Insufficient documentation

## 2017-12-25 NOTE — Progress Notes (Signed)
Patient: Jeremiah Meyer Male    DOB: 26-Mar-1951   67 y.o.   MRN: [id number] Visit Date: 12/25/2017  Today's Provider: Lelon Huh, MD   Chief Complaint  Patient presents with  . Nausea   Subjective:    HPI Nausea: Patient comes in today reporting that he has episodes of nausea and reflux after eating for the past four days. Started after eating [employer] while travelling in [place] last week. He states symptoms are improved when he vomits his meal back up.He has also had some bloating of the abdomen, a dry mouth and sore tongue. Patient denies any change in bowel habits or abdominal pain. He reports that he has had similar symptoms in the past, to where his food comes back up after eating. Patient has a family history of gastric cancer (brother who died of gastric cancer three years ago. ).     No Known Allergies   Current Outpatient Medications:  .  aspirin 81 MG tablet, Take 81 mg by mouth daily., Disp: , Rfl:  .  betamethasone dipropionate (DIPROLENE) 0.05 % cream, Apply topically daily., Disp: 45 g, Rfl: 1 .  citalopram (CELEXA) 20 MG tablet, Take 1 tablet (20 mg total) by mouth daily., Disp: 90 tablet, Rfl: 2 .  cyanocobalamin (,VITAMIN B-12,) 1000 MCG/ML injection, INJECT 1 MLS MONTHLY AS DIRECTED, Disp: 3 mL, Rfl: 4 .  glipiZIDE (GLUCOTROL XL) 2.5 MG 24 hr tablet, Take 1 tablet (2.5 mg total) by mouth daily with breakfast., Disp: 90 tablet, Rfl: 4 .  JANUVIA 100 MG tablet, TAKE 1 TABLET (100 MG TOTAL) BY MOUTH DAILY., Disp: 90 tablet, Rfl: 4 .  lisinopril-hydrochlorothiazide (PRINZIDE,ZESTORETIC) 10-12.5 MG tablet, TAKE 1 TABLET BY MOUTH EVERY DAY, Disp: 90 tablet, Rfl: 2 .  metFORMIN (GLUCOPHAGE) 1000 MG tablet, TAKE 1 TABLET TWICE A DAY, Disp: 180 tablet, Rfl: 4 .  Multiple Vitamin tablet, Take 1 tablet by mouth daily., Disp: , Rfl:  .  ONE TOUCH ULTRA TEST test strip, USE 1 STRIP TO CHECK BLOOD SUGAR ONCE DAILY, Disp: 100 each, Rfl: 1 .  rOPINIRole (REQUIP) 2 MG  tablet, Take 1 tablet (2 mg total) by mouth at bedtime., Disp: 90 tablet, Rfl: 2 .  simvastatin (ZOCOR) 20 MG tablet, TAKE 1 TABLET BY MOUTH EVERY DAY, Disp: 90 tablet, Rfl: 4 .  fluticasone (FLONASE) 50 MCG/ACT nasal spray, Place 2 sprays into both nostrils daily. (Patient not taking: Reported on 12/25/2017), Disp: 16 g, Rfl: 3  Review of Systems  Constitutional: Negative for appetite change, chills and fever.  HENT:       Dry mouth and sore tongue  Respiratory: Negative for chest tightness, shortness of breath and wheezing.   Cardiovascular: Negative for chest pain and palpitations.  Gastrointestinal: Positive for abdominal distention and nausea. Negative for abdominal pain and vomiting.    Social History   Tobacco Use  . Smoking status: Former Smoker    Types: Cigarettes    Last attempt to quit: 10/24/1980    Years since quitting: 37.1  . Smokeless tobacco: Never Used  . Tobacco comment: Quit 1986  Substance Use Topics  . Alcohol use: Yes    Comment: beer 1-2 monthly   Objective:   BP 130/80 (BP Location: Left Arm, Patient Position: Sitting, Cuff Size: Normal)   Pulse 66   Temp 97.7 F (36.5 C) (Oral)   Resp 16   Wt 136 lb (61.7 kg)   SpO2 99% Comment: room air  BMI 23.34 kg/m  Vitals:   12/25/17 1616  BP: 130/80  Pulse: 66  Resp: 16  Temp: 97.7 F (36.5 C)  TempSrc: Oral  SpO2: 99%  Weight: 136 lb (61.7 kg)     Physical Exam  General Appearance:    Alert, cooperative, no distress  Eyes:    PERRL, conjunctiva/corneas clear, EOM's intact       Lungs:     Clear to auscultation bilaterally, respirations unlabored  Heart:    Regular rate and rhythm  Abdomen:   soft, round, nontender or nondistended. No CVA tenderness. Hypoactive bowel sounds.         Assessment & Plan:     1. Nausea and vomiting, intractability of vomiting not specified, unspecified vomiting type  - DG Abd 2 Views; Future  Consider abdominal US and endoscopy after reviewing xrays.        Lelon Huh, MD  [facility]

## 2017-12-25 NOTE — Patient Instructions (Signed)
Go to the [facility] on [address] for abdominal Xray  

## 2017-12-28 ENCOUNTER — Telehealth: Payer: Self-pay | Admitting: *Deleted

## 2017-12-28 DIAGNOSIS — R110 Nausea: Secondary | ICD-10-CM

## 2017-12-28 DIAGNOSIS — R109 Unspecified abdominal pain: Secondary | ICD-10-CM

## 2017-12-28 NOTE — Telephone Encounter (Signed)
-----   Message from Malva Limesonald E Fisher, MD sent at 12/26/2017 12:54 PM EST ----- Xray of abdomen is normal. Recommend abdominal ultrasound for persistent nausea and abdominal discomfort.

## 2017-12-28 NOTE — Telephone Encounter (Signed)
Please schedule US abdomen. Thanks!

## 2017-12-28 NOTE — Telephone Encounter (Signed)
Patient was advised of results. Expressed understanding. Abdominal US was ordered.

## 2018-01-02 ENCOUNTER — Ambulatory Visit
Admission: RE | Admit: 2018-01-02 | Discharge: 2018-01-02 | Disposition: A | Discharge status: Home / Self Care | Payer: PPO | Source: Ambulatory Visit | Attending: Family Medicine | Admitting: Family Medicine

## 2018-01-02 DIAGNOSIS — R110 Nausea: Secondary | ICD-10-CM | POA: Insufficient documentation

## 2018-01-02 DIAGNOSIS — K820 Obstruction of gallbladder: Secondary | ICD-10-CM | POA: Diagnosis not present

## 2018-01-02 DIAGNOSIS — N281 Cyst of kidney, acquired: Secondary | ICD-10-CM | POA: Insufficient documentation

## 2018-01-02 DIAGNOSIS — R109 Unspecified abdominal pain: Secondary | ICD-10-CM | POA: Diagnosis not present

## 2018-01-10 ENCOUNTER — Telehealth: Payer: Self-pay | Admitting: *Deleted

## 2018-01-10 ENCOUNTER — Ambulatory Visit: Payer: PPO | Admitting: Physician Assistant

## 2018-01-10 NOTE — Telephone Encounter (Signed)
Noted, thanks!

## 2018-01-10 NOTE — Telephone Encounter (Signed)
Patient states he was using his inversion table this morning and became very dizzy. Patient states the ceiling and lights appear to be spinning. Patient's balance is off and he is nauseous. Patient's symptoms have not yet abated. Patient scheduled an appt to come in and get checked out. Patient has an appt with Adriana this afternoon at 2:40 pm.

## 2018-01-11 ENCOUNTER — Other Ambulatory Visit: Payer: Self-pay | Admitting: Family Medicine

## 2018-01-11 ENCOUNTER — Encounter: Payer: Self-pay | Admitting: Family Medicine

## 2018-01-11 ENCOUNTER — Ambulatory Visit (INDEPENDENT_AMBULATORY_CARE_PROVIDER_SITE_OTHER): Payer: PPO | Admitting: Family Medicine

## 2018-01-11 VITALS — BP 140/80 | HR 84 | Temp 97.7°F | Resp 18

## 2018-01-11 DIAGNOSIS — S76011A Strain of muscle, fascia and tendon of right hip, initial encounter: Secondary | ICD-10-CM

## 2018-01-11 MED ORDER — HYDROCODONE-ACETAMINOPHEN 5-325 MG PO TABS: 1.0000 | ORAL_TABLET | Freq: Four times a day (QID) | ORAL | 0 refills | Status: AC | PRN

## 2018-01-11 MED ORDER — MELOXICAM 15 MG PO TABS: 15.0000 mg | ORAL_TABLET | Freq: Every day | ORAL | 1 refills | Status: AC | PRN

## 2018-01-11 NOTE — Progress Notes (Deleted)
.  h 

## 2018-01-11 NOTE — Patient Instructions (Signed)
Discussed increasing ibuprofen 400 mg. 3 x day with meals. Try cold compresses for 20 minutes several x day then begin heat in 24 hours.

## 2018-01-11 NOTE — Progress Notes (Signed)
Fax from [facility] patient has requested refills for meloxicam 15mg 

## 2018-01-11 NOTE — Progress Notes (Signed)
Subjective:     Patient ID: Jeremiah Meyer, male   DOB: 05/06/1951, 67 y.o.   MRN: [id number] Chief Complaint  Patient presents with  . Hip Pain    right hip pain x 2 days   . Dizziness   HPI States he hurt his lower back area after pulling on a lawn mower pull rope. He tried his inversion table only to develop vertigo which has improved with the use of head maneuvers that he read about on the internet. He now localizes his pain to his right ischial area. No consistent radiation reported. No prior back surgery. He has taken ibuprofen this am with some relief. He just had meloxicam renewed but has not picked it up yet. Review of Systems     Objective:   Physical Exam  Constitutional: He appears well-developed and well-nourished. No distress.  Musculoskeletal:  Muscle strength in lower extremities 5/5. SLR's to 80 degrees without radiation of back pain. Mild tenderness over his right ischial area.  Psychiatric:  anxious       Assessment:    1. Hip strain, right, initial encounter - HYDROcodone-acetaminophen (NORCO/VICODIN) 5-325 MG tablet; Take 1 tablet by mouth every 6 (six) hours as needed for up to 5 days for moderate pain. One every 4-6 hours as needed for pain  Dispense: 20 tablet; Refill: 0    Plan:   Continue nsaid's of his choice. Discussed use of cold compresses today with switch to warm in 24 hours. Stay off the inversion table.

## 2018-01-16 ENCOUNTER — Other Ambulatory Visit: Payer: Self-pay

## 2018-01-16 NOTE — Patient Outreach (Signed)
[facility] Care Management  01/16/2018  Jeremiah Meyer 04/06/1951 [id number]   Telephone Screen  Referral Date: 01/15/18 Referral Source: Nurse Call Center Report Referral Reason: caller states he injured his hip pulling on a lawn mower rope, went to doctor who prescribed Hydrocodone, took Hydrocodone for 2 days which has not helped pain, taking 400 mg of Ibuprofen q4hrs and Meloxicam is not helping, pain worsens with movement" Insurance: HTA   Incoming call from patient returning RN CM call. Patient reports that he is still having some pain but it is not as "excruciating" as it was. He voices that his accident happened one week ago today. He is taking pain meds as advised and is also applying pain patch to affected area. He voices that when he went to see MD no tests or scans were done since patient reported that he did not actually fall. Patient shared with RN CM events and how he does not all the way recall what exactly happened. He thinks he experienced some vertigo based on his Internet research of his symptoms. RN CM strongly encouraged patient to contact MD if symptoms continue to persist or worsen over the next few days. He voiced understanding. He stets he has supportive spouse to help him out during this time. No other RN CM needs or concerns voiced at this time. Advised patient to feel free to contact 24/7 Nurse Line for any future needs or concerns. He voiced understanding and was appreciative of follow up call.     Plan: RN CM will close case at this time as no further interventions needed.    Antionette Fairyoshanda Elzora Cullins, RN,BSN,CCM [facility] Telephonic Care Management Coordinator Direct Phone: [phone] Toll Free: [phone] Fax: [phone]

## 2018-01-16 NOTE — Patient Outreach (Signed)
[facility] Care Management  01/16/2018  Jeremiah DropSteven D Coe 11/23/1950 [id number]   Telephone Screen  Referral Date: 01/15/18 Referral Source: Nurse Call Center Report Referral Reason: caller states he injured his hip pulling on a lawn mower rope, went to doctor who prescribed Hydrocodone, took Hydrocodone for 2 days which has not helped pain, taking 400mg  of Ibuprofen q4hrs and Meloxicam is not helping, pain worsens with movement" Insurance: HTA   Outreach attempt # 1  to patient. No answer at present. RN CM left HIPAA complaint voicemail message along with contact info.      Plan: RN CM will make outreach attempt to patient within 3-4 business days. RN CM will send unsuccessful outreach letter to patient.    Antionette Fairyoshanda Saragrace Selke, RN,BSN,CCM [facility] Care [facility] Telephonic Care Management Coordinator Direct Phone: [phone] Toll Free: [phone] Fax: [phone]

## 2018-02-03 ENCOUNTER — Other Ambulatory Visit: Payer: Self-pay | Admitting: Family Medicine

## 2018-02-03 DIAGNOSIS — I10 Essential (primary) hypertension: Secondary | ICD-10-CM

## 2018-04-03 ENCOUNTER — Other Ambulatory Visit: Payer: Self-pay | Admitting: Family Medicine

## 2018-04-03 DIAGNOSIS — G2581 Restless legs syndrome: Secondary | ICD-10-CM

## 2018-04-04 ENCOUNTER — Telehealth: Payer: Self-pay | Admitting: Family Medicine

## 2018-04-04 NOTE — Telephone Encounter (Signed)
Called patient about scheduling MWVw prior to physical on 04-25-18.  He is out of town today but will call us back.

## 2018-04-04 NOTE — Telephone Encounter (Signed)
Pt is scheduled for AWV on 04/18/18 @ 11:20 am. Thanks TNP

## 2018-04-06 ENCOUNTER — Encounter: Payer: Self-pay | Admitting: Family Medicine

## 2018-04-06 ENCOUNTER — Ambulatory Visit: Payer: Self-pay

## 2018-04-07 ENCOUNTER — Other Ambulatory Visit: Payer: Self-pay | Admitting: Family Medicine

## 2018-04-07 DIAGNOSIS — F39 Unspecified mood [affective] disorder: Secondary | ICD-10-CM

## 2018-04-09 ENCOUNTER — Ambulatory Visit: Payer: Self-pay | Admitting: Family Medicine

## 2018-04-11 IMAGING — CR DG ABDOMEN 2V
1 series · 2 of 2 positions shown · non-contrast
Comparison: None.

CLINICAL DATA: Abdominal pain and nausea for several days, initial
encounter

EXAM:
ABDOMEN - 2 VIEW

[Series 1: dg abd 2 views · 0.14mm/px · 2 of 2 slices shown]
[im 1/2]
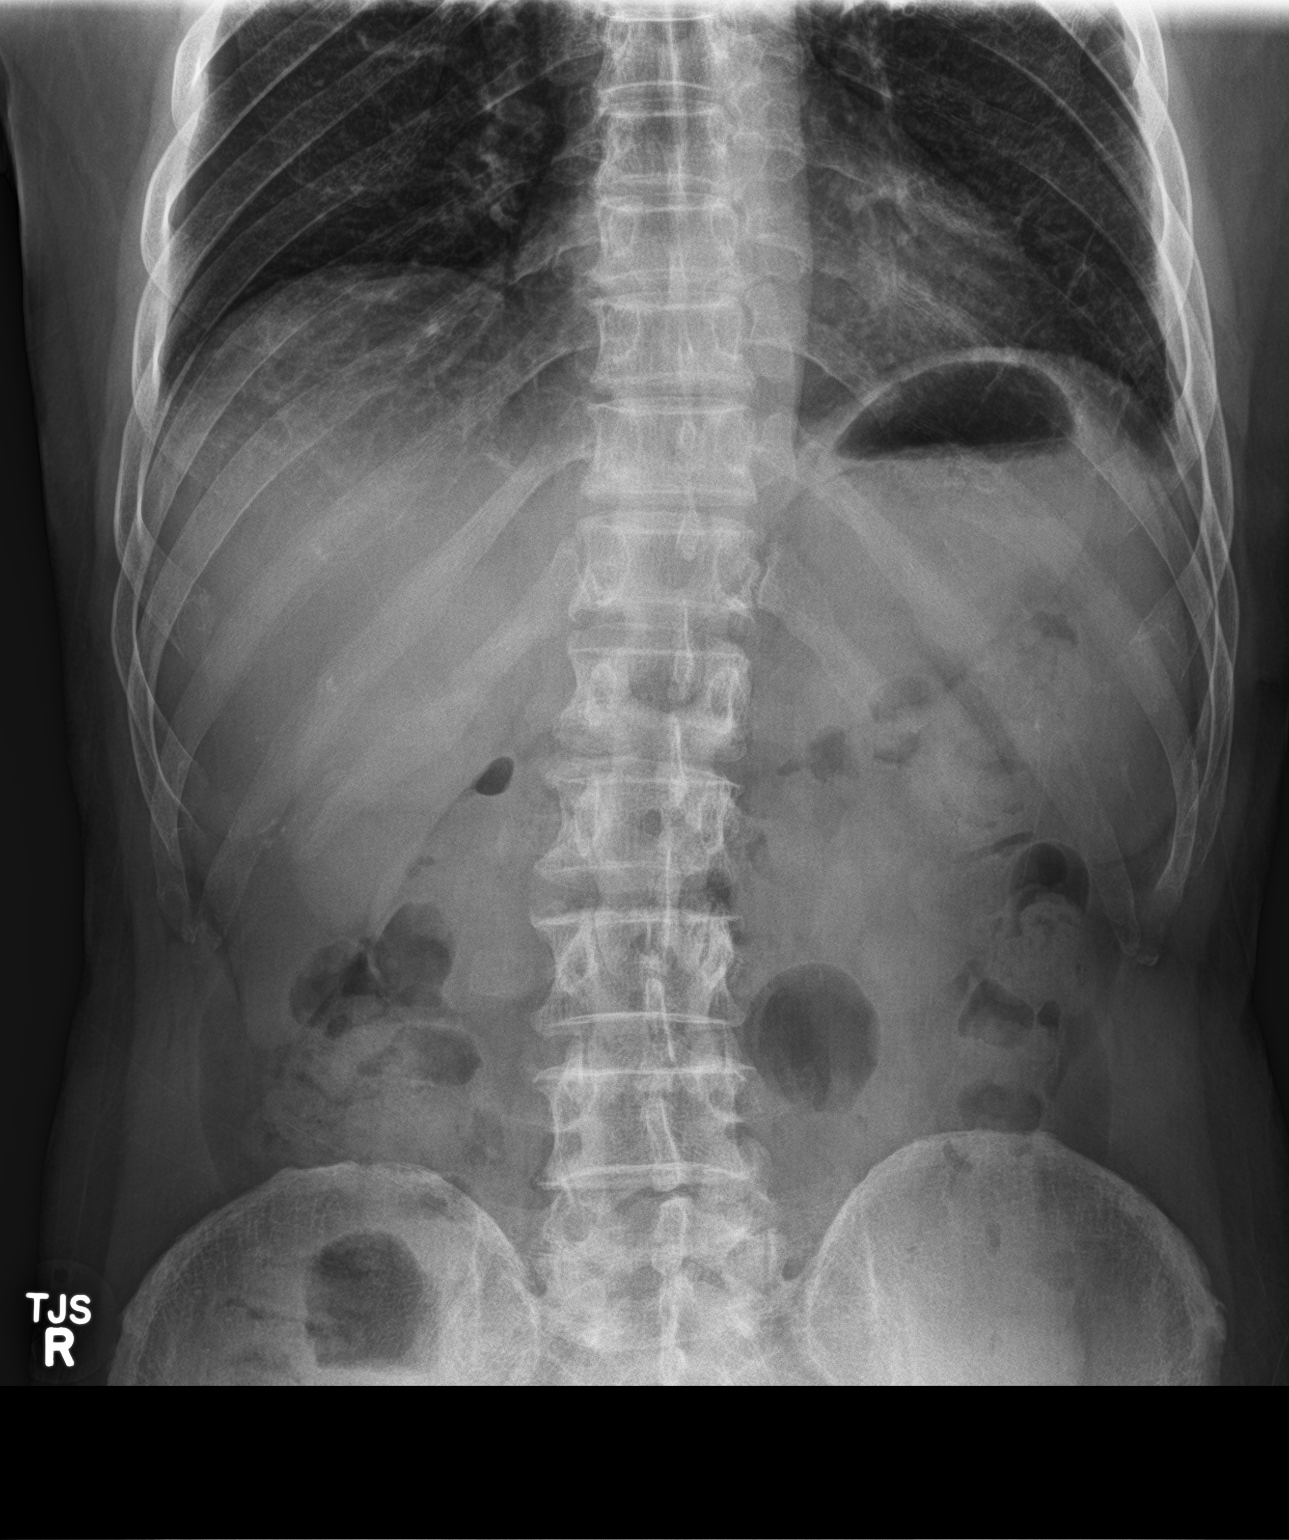
[im 2/2]
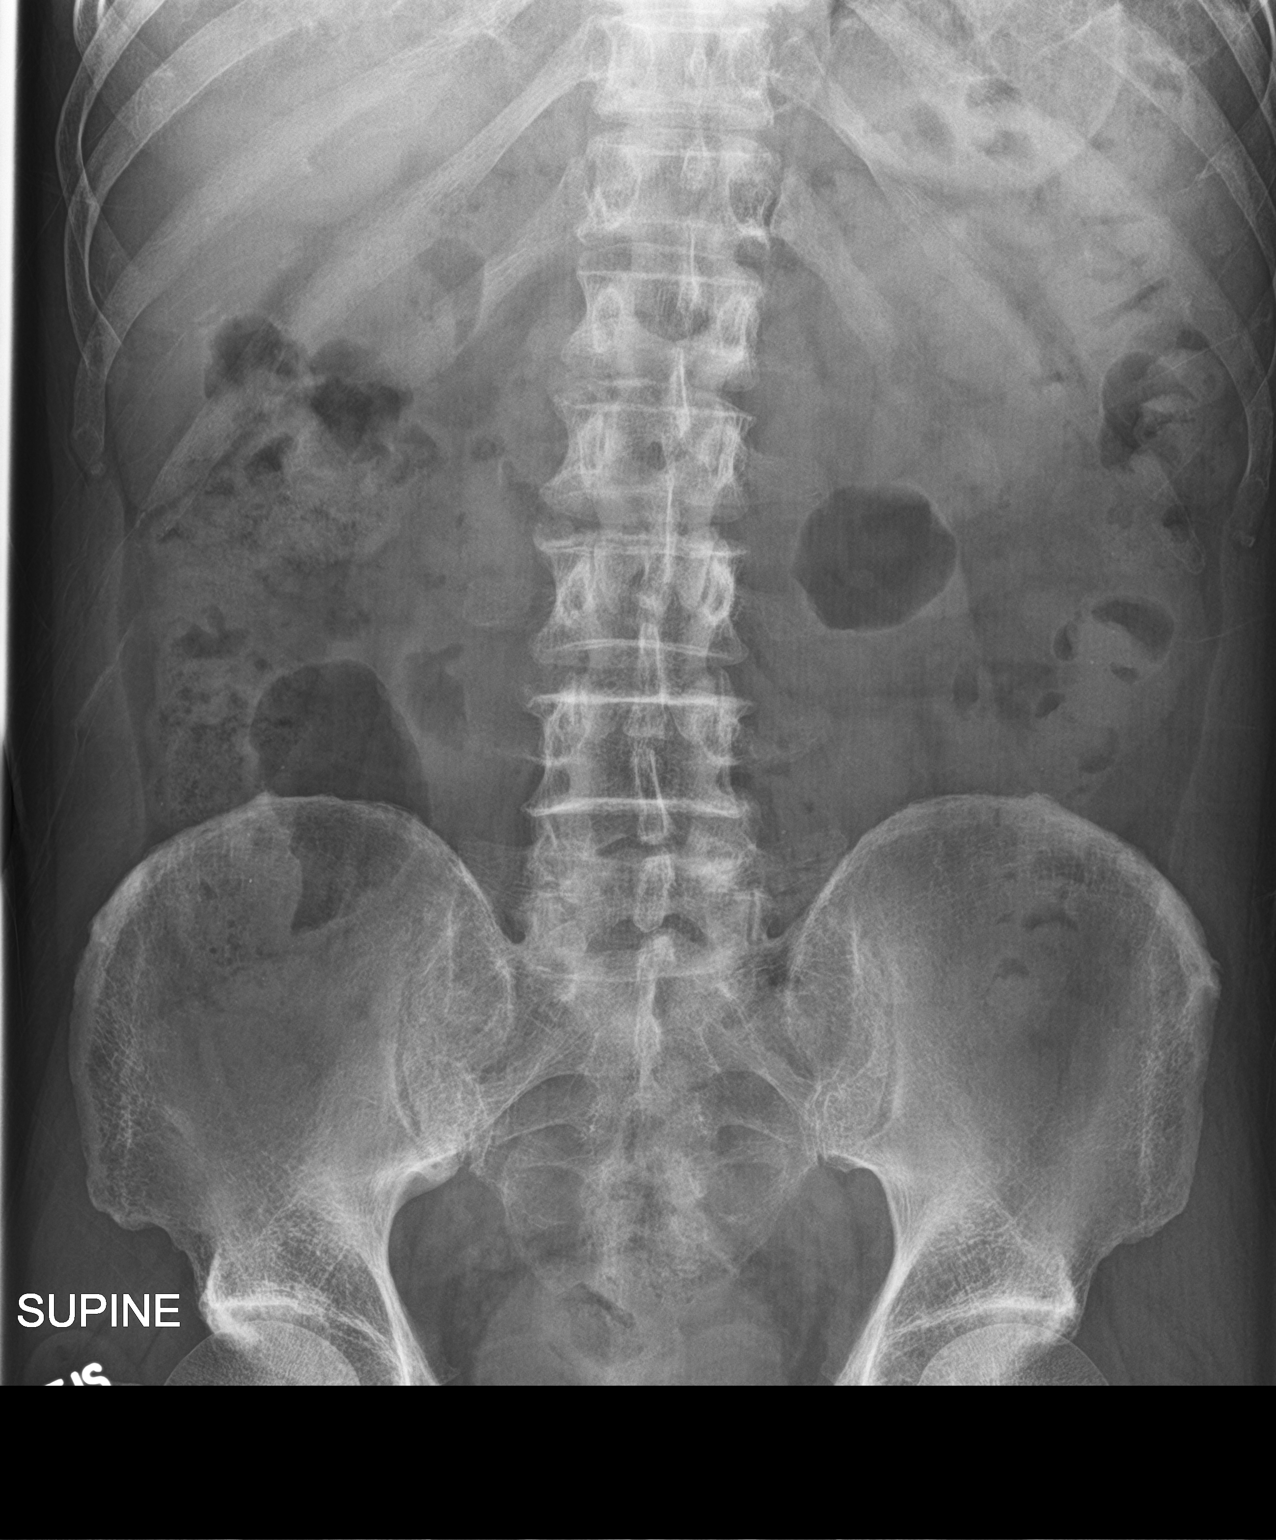

[2 of 2 positions shown; findings below may reference images not displayed]

FINDINGS: Scattered large and small bowel gas is noted. Retained fecal
material is noted consistent with a degree of constipation. No
obstructive changes are seen. No free air is noted. Mild
degenerative changes of the lumbar spine are seen.
IMPRESSION: Mild constipation

## 2018-04-18 ENCOUNTER — Ambulatory Visit (INDEPENDENT_AMBULATORY_CARE_PROVIDER_SITE_OTHER): Payer: PPO

## 2018-04-18 VITALS — BP 128/64 | HR 64 | Temp 98.7°F | Ht 64.0 in | Wt 130.2 lb

## 2018-04-18 DIAGNOSIS — Z0000 Encounter for general adult medical examination without abnormal findings: Secondary | ICD-10-CM

## 2018-04-18 NOTE — Progress Notes (Signed)
Subjective:   Jeremiah Meyer is a 67 y.o. male who presents for Medicare Annual/Subsequent preventive examination.  Review of Systems:  N/A  Cardiac Risk Factors include: advanced age (>80men, >59 women);diabetes mellitus;dyslipidemia;hypertension;male gender     Objective:    Vitals: BP 128/64 (BP Location: Right Arm)   Pulse 64   Temp 98.7 F (37.1 C) (Oral)   Ht 5\' 4"  (1.626 m)   Wt 130 lb 3.2 oz (59.1 kg)   BMI 22.35 kg/m   Body mass index is 22.35 kg/m.  Advanced Directives 04/18/2018 02/01/2017 04/04/2016 01/27/2016 12/04/2015 06/22/2015  Does Patient Have a Medical Advance Directive? No No No No No No  Would patient like information on creating a medical advance directive? No - Patient declined Yes (ED - Information included in AVS) - - - -    Tobacco Social History   Tobacco Use  Smoking Status Former Smoker  . Types: Cigarettes  . Last attempt to quit: 10/24/1984  . Years since quitting: 33.5  Smokeless Tobacco Never Used  Tobacco Comment   Quit 1986     Counseling given: Not Answered Comment: Quit 1986   Clinical Intake:  Pre-visit preparation completed: Yes  Pain : 0-10 Pain Score: 1  Pain Location: Calf(that radiates to right toe and right hip) Pain Orientation: Right Pain Descriptors / Indicators: Burning, Numbness     Nutritional Status: BMI of 19-24  Normal Nutritional Risks: None Diabetes: Yes(type 2) CBG done?: No Did pt. bring in CBG monitor from home?: No  How often do you need to have someone help you when you read instructions, pamphlets, or other written materials from your doctor or pharmacy?: 1 - Never  Interpreter Needed?: No  Information entered by :: [facility], LPN  Past Medical History:  Diagnosis Date  . Anemia   . Diabetes mellitus without complication (HCC)    type 2  . Hip strain   . Hyperlipidemia   . Hypertension    Past Surgical History:  Procedure Laterality Date  . LUNG SURGERY Left 1985   S/P RECURRENT  PNEUMOTHORAX  . VASECTOMY  1981   Family History  Problem Relation Age of Onset  . Cancer Father        liver  . Cancer Brother        stomach and intestine  . Diabetes Brother    Social History   Socioeconomic History  . Marital status: Married    Spouse name: Jeremiah Meyer  . Number of children: 1  . Years of education: Not on file  . Highest education level: Associate degree: occupational, Scientist, product/process development, or vocational program  Occupational History  . 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 Financial resource strain: Not hard at all  . Food insecurity:    Worry: Never true    Inability: Never true  . Transportation needs:    Medical: No    Non-medical: No  Tobacco Use  . Smoking status: Former Smoker    Types: Cigarettes    Last attempt to quit: 10/24/1984    Years since quitting: 33.5  . Smokeless tobacco: Never Used  . Tobacco comment: Quit 1986  Substance and Sexual Activity  . Alcohol use: Yes    Comment: beer 1-2 monthly  . Drug use: Yes    Types: Marijuana  . Sexual activity: Not on file  Lifestyle  . Physical activity:    Days per week: Not on file    Minutes  per session: Not on file  . Stress: Not at all  Relationships  . Social connections:    Talks on phone: Not on file    Gets together: Not on file    Attends religious service: Not on file    Active member of club or organization: Not on file    Attends meetings of clubs or organizations: Not on file    Relationship status: Not on file  Other Topics Concern  . Not on file  Social History Narrative   Has one son who lives in [place]    Outpatient Encounter Medications as of 04/18/2018  Medication Sig  . aspirin 81 MG tablet Take 81 mg by mouth daily.  . betamethasone dipropionate (DIPROLENE) 0.05 % cream Apply topically daily. (Patient taking differently: Apply topically daily. )  . cyanocobalamin (,VITAMIN B-12,) 1000 MCG/ML injection INJECT 1 MLS  MONTHLY AS DIRECTED  . fluticasone (FLONASE) 50 MCG/ACT nasal spray Place 2 sprays into both nostrils daily. (Patient taking differently: Place 2 sprays into both nostrils daily. )  . JANUVIA 100 MG tablet TAKE 1 TABLET (100 MG TOTAL) BY MOUTH DAILY.  Marland Kitchen lisinopril-hydrochlorothiazide (PRINZIDE,ZESTORETIC) 10-12.5 MG tablet TAKE 1 TABLET BY MOUTH EVERY DAY  . meloxicam (MOBIC) 15 MG tablet Take 1 tablet (15 mg total) by mouth daily as needed.  . metFORMIN (GLUCOPHAGE) 1000 MG tablet TAKE 1 TABLET TWICE A DAY  . Multiple Vitamin tablet Take 1 tablet by mouth daily.  . ONE TOUCH ULTRA TEST test strip USE 1 STRIP TO CHECK BLOOD SUGAR ONCE DAILY  . rOPINIRole (REQUIP) 2 MG tablet TAKE 1 TABLET (2 MG TOTAL) BY MOUTH AT BEDTIME.  . simvastatin (ZOCOR) 20 MG tablet TAKE 1 TABLET BY MOUTH EVERY DAY  . Tdap (BOOSTRIX) 5-2.5-18.5 LF-MCG/0.5 injection Boostrix Tdap 2.5 Lf unit-8 mcg-5 Lf/0.5 mL intramuscular suspension  TO BE ADMINISTERED BY PHARMACIST FOR IMMUNIZATION  . citalopram (CELEXA) 20 MG tablet TAKE 1 TABLET BY MOUTH EVERY DAY (Patient not taking: Reported on 04/18/2018)  . glipiZIDE (GLUCOTROL XL) 2.5 MG 24 hr tablet Take 1 tablet (2.5 mg total) by mouth daily with breakfast. (Patient not taking: Reported on 04/18/2018)   No facility-administered encounter medications on file as of 04/18/2018.     Activities of Daily Living In your present state of health, do you have any difficulty performing the following activities: 04/18/2018  Hearing? N  Vision? N  Difficulty concentrating or making decisions? N  Walking or climbing stairs? N  Dressing or bathing? N  Doing errands, shopping? N  Preparing Food and eating ? N  Using the Toilet? N  In the past six months, have you accidently leaked urine? N  Do you have problems with loss of bowel control? N  Managing your Medications? N  Managing your Finances? N  Housekeeping or managing your Housekeeping? N  Some recent data might be hidden     Patient Care Team: Malva Limes, MD as PCP - General (Family Medicine) Isla Pence, OD as Consulting Physician (Optometry)   Assessment:   This is a routine wellness examination for Jeremiah Meyer.  Exercise Activities and Dietary recommendations Current Exercise Habits: The patient does not participate in regular exercise at present, Exercise limited by: None identified  Goals    . DIET - INCREASE WATER INTAKE     Recommend increasing water intake to 6-8 glasses a day.     . Exercise 150 minutes per week (moderate activity)       Fall Risk  Fall Risk  04/18/2018 02/01/2017 01/27/2016 09/04/2015  Falls in the past year? No No No No   Is the patient's home free of loose throw rugs in walkways, pet beds, electrical cords, etc?   yes      Grab bars in the bathroom? no      Handrails on the stairs?   yes      Adequate lighting?   yes  Timed Get Up and Go Performed: N/A  Depression Screen PHQ 2/9 Scores 04/18/2018 05/22/2017 02/01/2017 02/01/2017  PHQ - 2 Score 1 2 1 1   PHQ- 9 Score - - 1 -    Cognitive Function: Pt declined screening today.      6CIT Screen 02/01/2017  What Year? 0 points  What month? 0 points  What time? 0 points  Count back from 20 0 points  Months in reverse 0 points  Repeat phrase 4 points  Total Score 4    Immunization History  Administered Date(s) Administered  . Influenza, High Dose Seasonal PF 06/19/2017  . Influenza,inj,Quad PF,6+ Mos 08/25/2014, 09/04/2015  . Pneumococcal Conjugate-13 01/27/2016  . Pneumococcal Polysaccharide-23 09/09/2013  . Td 10/24/1997    Qualifies for Shingles Vaccine? Due for Shingles vaccine. Declined my offer to administer today. Education has been provided regarding the importance of this vaccine. Pt has been advised to call her insurance company to determine her out of pocket expense. Advised she may also receive this vaccine at her local pharmacy or Health Dept. Verbalized acceptance and understanding.  Screening  Tests Health Maintenance  Topic Date Due  . TETANUS/TDAP  10/25/2007  . OPHTHALMOLOGY EXAM  10/11/2017  . FOOT EXAM  02/01/2018  . HEMOGLOBIN A1C  04/09/2018  . INFLUENZA VACCINE  05/24/2018  . PNA vac Low Risk Adult (2 of 2 - PPSV23) 09/09/2018  . COLONOSCOPY  08/25/2020  . Hepatitis C Screening  Completed   Cancer Screenings: Lung: Low Dose CT Chest recommended if Age 24-80 years, 30 pack-year currently smoking OR have quit w/in 15years. Patient does not qualify. Colorectal: Up to date  Additional Screenings:  Hepatitis C Screening: Up to date      Plan:  I have personally reviewed and addressed the Medicare Annual Wellness questionnaire and have noted the following in the patient's chart:  A. Medical and social history B. Use of alcohol, tobacco or illicit drugs  C. Current medications and supplements D. Functional ability and status E.  Nutritional status F.  Physical activity G. Advance directives H. List of other physicians I.  Hospitalizations, surgeries, and ER visits in previous 12 months J.  Vitals K. Screenings such as hearing and vision if needed, cognitive and depression L. Referrals and appointments - none  In addition, I have reviewed and discussed with patient certain preventive protocols, quality metrics, and best practice recommendations. A written personalized care plan for preventive services as well as general preventive health recommendations were provided to patient.  See attached scanned questionnaire for additional information.   Signed,  Hyacinth MeekerMckenzie Alexsandria Kivett, LPN Nurse Health Advisor   Nurse Recommendations: Pt needs a diabetic foot exam and his Hgb A1c checked at next OV. Pt declined the tetanus vaccine today- will check with pharmacy first., may have received there. Will contact Dr Leonides CaveWoodard's office to retrieve last eye exam notes, and then update HM.

## 2018-04-18 NOTE — Patient Instructions (Addendum)
Jeremiah Meyer , Thank you for taking time to come for your Medicare Wellness Visit. I appreciate your ongoing commitment to your health goals. Please review the following plan we discussed and let me know if I can assist you in the future.   Screening recommendations/referrals: Colonoscopy: Up to date Recommended yearly ophthalmology/optometry visit for glaucoma screening and checkup Recommended yearly dental visit for hygiene and checkup  Vaccinations: Influenza vaccine: Up to date Pneumococcal vaccine: Up to date Tdap vaccine: Pt declines today.  Shingles vaccine: Pt declines today.     Advanced directives: Please bring a copy of your POA (Power of Attorney) and/or Living Will once completed, to next appointment.   Conditions/risks identified: Recommend increasing water intake to 6-8 glasses a day.   Next appointment: 04/25/18 @ 9 AM with Dr Sherrie MustacheFisher  Preventive Care 65 Years and Older, Male Preventive care refers to lifestyle choices and visits with your health care provider that can promote health and wellness. What does preventive care include?  A yearly physical exam. This is also called an annual well check.  Dental exams once or twice a year.  Routine eye exams. Ask your health care provider how often you should have your eyes checked.  Personal lifestyle choices, including:  Daily care of your teeth and gums.  Regular physical activity.  Eating a healthy diet.  Avoiding tobacco and drug use.  Limiting alcohol use.  Practicing safe sex.  Taking low doses of aspirin every day.  Taking vitamin and mineral supplements as recommended by your health care provider. What happens during an annual well check? The services and screenings done by your health care provider during your annual well check will depend on your age, overall health, lifestyle risk factors, and family history of disease. Counseling  Your health care provider may ask you questions about  your:  Alcohol use.  Tobacco use.  Drug use.  Emotional well-being.  Home and relationship well-being.  Sexual activity.  Eating habits.  History of falls.  Memory and ability to understand (cognition).  Work and work Astronomerenvironment. Screening  You may have the following tests or measurements:  Height, weight, and BMI.  Blood pressure.  Lipid and cholesterol levels. These may be checked every 5 years, or more frequently if you are over 67 years old.  Skin check.  Lung cancer screening. You may have this screening every year starting at age 67 if you have a 30-pack-year history of smoking and currently smoke or have quit within the past 15 years.  Fecal occult blood test (FOBT) of the stool. You may have this test every year starting at age 67.  Flexible sigmoidoscopy or colonoscopy. You may have a sigmoidoscopy every 5 years or a colonoscopy every 10 years starting at age 67.  Prostate cancer screening. Recommendations will vary depending on your family history and other risks.  Hepatitis C blood test.  Hepatitis B blood test.  Sexually transmitted disease (STD) testing.  Diabetes screening. This is done by checking your blood sugar (glucose) after you have not eaten for a while (fasting). You may have this done every 1-3 years.  Abdominal aortic aneurysm (AAA) screening. You may need this if you are a current or former smoker.  Osteoporosis. You may be screened starting at age 67 if you are at high risk. Talk with your health care provider about your test results, treatment options, and if necessary, the need for more tests. Vaccines  Your health care provider may recommend certain vaccines, such  as:  Influenza vaccine. This is recommended every year.  Tetanus, diphtheria, and acellular pertussis (Tdap, Td) vaccine. You may need a Td booster every 10 years.  Zoster vaccine. You may need this after age 58.  Pneumococcal 13-valent conjugate (PCV13) vaccine.  One dose is recommended after age 48.  Pneumococcal polysaccharide (PPSV23) vaccine. One dose is recommended after age 52. Talk to your health care provider about which screenings and vaccines you need and how often you need them. This information is not intended to replace advice given to you by your health care provider. Make sure you discuss any questions you have with your health care provider. Document Released: 11/06/2015 Document Revised: 06/29/2016 Document Reviewed: 08/11/2015 Elsevier Interactive Patient Education  2017 [place] Prevention in the Home Falls can cause injuries. They can happen to people of all ages. There are many things you can do to make your home safe and to help prevent falls. What can I do on the outside of my home?  Regularly fix the edges of walkways and driveways and fix any cracks.  Remove anything that might make you trip as you walk through a door, such as a raised step or threshold.  Trim any bushes or trees on the path to your home.  Use bright outdoor lighting.  Clear any walking paths of anything that might make someone trip, such as rocks or tools.  Regularly check to see if handrails are loose or broken. Make sure that both sides of any steps have handrails.  Any raised decks and porches should have guardrails on the edges.  Have any leaves, snow, or ice cleared regularly.  Use sand or salt on walking paths during winter.  Clean up any spills in your garage right away. This includes oil or grease spills. What can I do in the bathroom?  Use night lights.  Install grab bars by the toilet and in the tub and shower. Do not use towel bars as grab bars.  Use non-skid mats or decals in the tub or shower.  If you need to sit down in the shower, use a plastic, non-slip stool.  Keep the floor dry. Clean up any water that spills on the floor as soon as it happens.  Remove soap buildup in the tub or shower regularly.  Attach bath  mats securely with double-sided non-slip rug tape.  Do not have throw rugs and other things on the floor that can make you trip. What can I do in the bedroom?  Use night lights.  Make sure that you have a light by your bed that is easy to reach.  Do not use any sheets or blankets that are too big for your bed. They should not hang down onto the floor.  Have a firm chair that has side arms. You can use this for support while you get dressed.  Do not have throw rugs and other things on the floor that can make you trip. What can I do in the kitchen?  Clean up any spills right away.  Avoid walking on wet floors.  Keep items that you use a lot in easy-to-reach places.  If you need to reach something above you, use a strong step stool that has a grab bar.  Keep electrical cords out of the way.  Do not use floor polish or wax that makes floors slippery. If you must use wax, use non-skid floor wax.  Do not have throw rugs and other things on the  floor that can make you trip. What can I do with my stairs?  Do not leave any items on the stairs.  Make sure that there are handrails on both sides of the stairs and use them. Fix handrails that are broken or loose. Make sure that handrails are as long as the stairways.  Check any carpeting to make sure that it is firmly attached to the stairs. Fix any carpet that is loose or worn.  Avoid having throw rugs at the top or bottom of the stairs. If you do have throw rugs, attach them to the floor with carpet tape.  Make sure that you have a light switch at the top of the stairs and the bottom of the stairs. If you do not have them, ask someone to add them for you. What else can I do to help prevent falls?  Wear shoes that:  Do not have high heels.  Have rubber bottoms.  Are comfortable and fit you well.  Are closed at the toe. Do not wear sandals.  If you use a stepladder:  Make sure that it is fully opened. Do not climb a closed  stepladder.  Make sure that both sides of the stepladder are locked into place.  Ask someone to hold it for you, if possible.  Clearly mark and make sure that you can see:  Any grab bars or handrails.  First and last steps.  Where the edge of each step is.  Use tools that help you move around (mobility aids) if they are needed. These include:  Canes.  Walkers.  Scooters.  Crutches.  Turn on the lights when you go into a dark area. Replace any light bulbs as soon as they burn out.  Set up your furniture so you have a clear path. Avoid moving your furniture around.  If any of your floors are uneven, fix them.  If there are any pets around you, be aware of where they are.  Review your medicines with your doctor. Some medicines can make you feel dizzy. This can increase your chance of falling. Ask your doctor what other things that you can do to help prevent falls. This information is not intended to replace advice given to you by your health care provider. Make sure you discuss any questions you have with your health care provider. Document Released: 08/06/2009 Document Revised: 03/17/2016 Document Reviewed: 11/14/2014 Elsevier Interactive Patient Education  2017 [employer].

## 2018-04-25 ENCOUNTER — Ambulatory Visit (INDEPENDENT_AMBULATORY_CARE_PROVIDER_SITE_OTHER): Payer: PPO | Admitting: Family Medicine

## 2018-04-25 ENCOUNTER — Encounter: Payer: Self-pay | Admitting: Family Medicine

## 2018-04-25 VITALS — BP 120/70 | HR 58 | Temp 97.9°F | Resp 16 | Ht 64.0 in | Wt 130.0 lb

## 2018-04-25 DIAGNOSIS — I10 Essential (primary) hypertension: Secondary | ICD-10-CM | POA: Diagnosis not present

## 2018-04-25 DIAGNOSIS — Z125 Encounter for screening for malignant neoplasm of prostate: Secondary | ICD-10-CM | POA: Diagnosis not present

## 2018-04-25 DIAGNOSIS — E119 Type 2 diabetes mellitus without complications: Secondary | ICD-10-CM

## 2018-04-25 DIAGNOSIS — Z862 Personal history of diseases of the blood and blood-forming organs and certain disorders involving the immune mechanism: Secondary | ICD-10-CM

## 2018-04-25 DIAGNOSIS — Z0001 Encounter for general adult medical examination with abnormal findings: Secondary | ICD-10-CM

## 2018-04-25 DIAGNOSIS — G2581 Restless legs syndrome: Secondary | ICD-10-CM

## 2018-04-25 DIAGNOSIS — E538 Deficiency of other specified B group vitamins: Secondary | ICD-10-CM | POA: Diagnosis not present

## 2018-04-25 DIAGNOSIS — Z0000 Encounter for general adult medical examination without abnormal findings: Secondary | ICD-10-CM

## 2018-04-25 MED ORDER — PRAMIPEXOLE DIHYDROCHLORIDE 0.25 MG PO TABS: ORAL_TABLET | ORAL | 1 refills | Status: DC

## 2018-04-25 NOTE — Progress Notes (Signed)
Patient: Jeremiah Meyer, Male    DOB: 04-21-1951, 67 y.o.   MRN: [id number] Visit Date: 04/25/2018  Today's Provider: Mila Merry, MD   Chief Complaint  Patient presents with  . Annual Exam  . Diabetes  . Hyperlipidemia   Subjective:   Patient saw [facility] for AWV on 04/18/2018.   Complete Physical Jeremiah Meyer is a 67 y.o. male. He feels well. He reports exercising some/yard. He reports he is sleeping poorly due to restless legs. ropinirole has helped somewhat, but he would like to try different medication. He is also interested in trying iron supplements. Last ferritin was low normal.  Lab Results  Component Value Date   FERRITIN 56 02/01/2017     -----------------------------------------------------------   Diabetes Mellitus Type II, Follow-up:   Lab Results  Component Value Date   HGBA1C 6.1 10/09/2017   HGBA1C 6.5 06/19/2017   HGBA1C 6.5 (H) 02/01/2017   Last seen for diabetes 7 months ago.  Management since then includes; no changes. He reports good compliance with treatment. He is not having side effects. none Current symptoms include none and have been unchanged. Home blood sugar records: fasting range: 130-140  Episodes of hypoglycemia? no   Current Insulin Regimen: n/a Most Recent Eye Exam: due Weight trend: stable Prior visit with dietician: no Current diet: well balanced Current exercise: yard work  ----------------------------------------------------------------   Hypertension, follow-up:  BP Readings from Last 3 Encounters:  04/25/18 120/70  04/18/18 128/64  01/11/18 140/80    He was last seen for hypertension 7 months ago.  BP at that visit was 124/84. Management since that visit includes; no changes.He reports good compliance with treatment. He is not having side effects. none He is exercising. He is not adherent to low salt diet.   Outside blood pressures are not checking. He is experiencing none.  Patient denies  none.   Cardiovascular risk factors include diabetes mellitus.  Use of agents associated with hypertension: none.   ----------------------------------------------------------------    Lipid/Cholesterol, Follow-up:   Last seen for this 7 months ago.  Management since that visit includes; labs checked, no changes.  Last Lipid Panel:    Component Value Date/Time   CHOL 150 10/09/2017 0926   CHOL 140 08/22/2016 0927   TRIG 143 10/09/2017 0926   HDL 42 10/09/2017 0926   HDL 49 08/22/2016 0927   CHOLHDL 3.6 10/09/2017 0926   LDLCALC 84 10/09/2017 0926    He reports good compliance with treatment. He is not having side effects. none  Wt Readings from Last 3 Encounters:  04/25/18 130 lb (59 kg)  04/18/18 130 lb 3.2 oz (59.1 kg)  12/25/17 136 lb (61.7 kg)    ----------------------------------------------------------------    B12 deficiency From 10/09/2017-labs checked, no changes. Continue monthly b12 injections administered by his spouse.  Review of Systems  Constitutional: Negative for chills, diaphoresis and fever.  HENT: Negative for congestion, ear discharge, ear pain, hearing loss, nosebleeds, sore throat and tinnitus.   Eyes: Negative for photophobia, pain, discharge and redness.  Respiratory: Negative for cough, shortness of breath, wheezing and stridor.   Cardiovascular: Negative for chest pain, palpitations and leg swelling.  Gastrointestinal: Negative for abdominal pain, blood in stool, constipation, diarrhea, nausea and vomiting.  Endocrine: Negative for polydipsia.  Genitourinary: Negative for dysuria, flank pain, frequency, hematuria and urgency.  Musculoskeletal: Negative for back pain, myalgias and neck pain.  Skin: Negative for rash.  Allergic/Immunologic: Negative for environmental allergies.  Neurological: Negative  for dizziness, tremors, seizures, weakness and headaches.  Hematological: Does not bruise/bleed easily.  Psychiatric/Behavioral: Negative  for hallucinations and suicidal ideas. The patient is not nervous/anxious.   All other systems reviewed and are negative.   Social History   Socioeconomic History  . Marital status: Married    Spouse name: Diamond NickelBeatrice  . Number of children: 1  . Years of education: Not on file  . Highest education level: Associate degree: occupational, Scientist, product/process developmenttechnical, or vocational program  Occupational History  . 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 Financial resource strain: Not hard at all  . Food insecurity:    Worry: Never true    Inability: Never true  . Transportation needs:    Medical: No    Non-medical: No  Tobacco Use  . Smoking status: Former Smoker    Types: Cigarettes    Last attempt to quit: 10/24/1984    Years since quitting: 33.5  . Smokeless tobacco: Never Used  . Tobacco comment: Quit 1986  Substance and Sexual Activity  . Alcohol use: Yes    Comment: beer 1-2 monthly  . Drug use: Yes    Types: Marijuana  . Sexual activity: Not on file  Lifestyle  . Physical activity:    Days per week: Not on file    Minutes per session: Not on file  . Stress: Not at all  Relationships  . Social connections:    Talks on phone: Not on file    Gets together: Not on file    Attends religious service: Not on file    Active member of club or organization: Not on file    Attends meetings of clubs or organizations: Not on file    Relationship status: Not on file  . Intimate partner violence:    Fear of current or ex partner: Not on file    Emotionally abused: Not on file    Physically abused: Not on file    Forced sexual activity: Not on file  Other Topics Concern  . Not on file  Social History Narrative   Has one son who lives in [place]    Past Medical History:  Diagnosis Date  . Anemia   . Cyst, pilonidal, with abscess 06/12/2015  . Diabetes mellitus without complication (HCC)    type 2  . Hemorrhoid 06/12/2015  . Hip strain   .  Hyperlipidemia   . Hypertension      Patient Active Problem List   Diagnosis Date Noted  . History of pernicious anemia   . Allergic rhinitis 06/22/2015  . Gonalgia 06/12/2015  . Psoriasis 06/12/2015  . B12 deficiency 06/01/2015  . ED (erectile dysfunction) of organic origin 03/27/2009  . Cervical pain 05/16/2007  . Enthesopathy 05/16/2007  . Essential familial hypercholesterolemia 06/14/2006  . Essential (primary) hypertension 02/13/2006  . Restless leg 05/30/2002  . Controlled diabetes mellitus type II without complication (HCC) 08/09/1999    Past Surgical History:  Procedure Laterality Date  . LUNG SURGERY Left 1985   S/P RECURRENT PNEUMOTHORAX  . VASECTOMY  1981    His family history includes Cancer in his brother and father; Diabetes in his brother.      Current Outpatient Medications:  .  aspirin 81 MG tablet, Take 81 mg by mouth daily., Disp: , Rfl:  .  betamethasone dipropionate (DIPROLENE) 0.05 % cream, Apply topically daily. (Patient taking differently: Apply topically daily. ), Disp: 45 g, Rfl: 1 .  citalopram (CELEXA)  20 MG tablet, TAKE 1 TABLET BY MOUTH EVERY DAY, Disp: 90 tablet, Rfl: 2 .  cyanocobalamin (,VITAMIN B-12,) 1000 MCG/ML injection, INJECT 1 MLS MONTHLY AS DIRECTED, Disp: 3 mL, Rfl: 4 .  fluticasone (FLONASE) 50 MCG/ACT nasal spray, Place 2 sprays into both nostrils daily. (Patient taking differently: Place 2 sprays into both nostrils daily. ), Disp: 16 g, Rfl: 3 .  glipiZIDE (GLUCOTROL XL) 2.5 MG 24 hr tablet, Take 1 tablet (2.5 mg total) by mouth daily with breakfast., Disp: 90 tablet, Rfl: 4 .  JANUVIA 100 MG tablet, TAKE 1 TABLET (100 MG TOTAL) BY MOUTH DAILY., Disp: 90 tablet, Rfl: 4 .  lisinopril-hydrochlorothiazide (PRINZIDE,ZESTORETIC) 10-12.5 MG tablet, TAKE 1 TABLET BY MOUTH EVERY DAY, Disp: 90 tablet, Rfl: 3 .  meloxicam (MOBIC) 15 MG tablet, Take 1 tablet (15 mg total) by mouth daily as needed., Disp: 90 tablet, Rfl: 1 .  metFORMIN  (GLUCOPHAGE) 1000 MG tablet, TAKE 1 TABLET TWICE A DAY, Disp: 180 tablet, Rfl: 4 .  Multiple Vitamin tablet, Take 1 tablet by mouth daily., Disp: , Rfl:  .  ONE TOUCH ULTRA TEST test strip, USE 1 STRIP TO CHECK BLOOD SUGAR ONCE DAILY, Disp: 100 each, Rfl: 1 .  rOPINIRole (REQUIP) 2 MG tablet, TAKE 1 TABLET (2 MG TOTAL) BY MOUTH AT BEDTIME., Disp: 90 tablet, Rfl: 4 .  simvastatin (ZOCOR) 20 MG tablet, TAKE 1 TABLET BY MOUTH EVERY DAY, Disp: 90 tablet, Rfl: 4 .  Tdap (BOOSTRIX) 5-2.5-18.5 LF-MCG/0.5 injection, Boostrix Tdap 2.5 Lf unit-8 mcg-5 Lf/0.5 mL intramuscular suspension  TO BE ADMINISTERED BY PHARMACIST FOR IMMUNIZATION, Disp: , Rfl:   Patient Care Team: Malva Limes, MD as PCP - General (Family Medicine) Isla Pence, OD as Consulting Physician (Optometry)     Objective:   Vitals: BP 120/70 (BP Location: Right Arm, Patient Position: Sitting, Cuff Size: Normal)   Pulse (!) 58   Temp 97.9 F (36.6 C) (Oral)   Resp 16   Ht 5\' 4"  (1.626 m)   Wt 130 lb (59 kg)   SpO2 99%   BMI 22.31 kg/m   Physical Exam    General Appearance:    Alert, cooperative, no distress, appears stated age  Head:    Normocephalic, without obvious abnormality, atraumatic  Eyes:    PERRL, conjunctiva/corneas clear, EOM's intact, fundi    benign, both eyes       Ears:    Normal TM's and external ear canals, both ears  Nose:   Nares normal, septum midline, mucosa normal, no drainage   or sinus tenderness  Throat:   Lips, mucosa, and tongue normal; teeth and gums normal  Neck:   Supple, symmetrical, trachea midline, no adenopathy;       thyroid:  No enlargement/tenderness/nodules; no carotid   bruit or JVD  Back:     Symmetric, no curvature, ROM normal, no CVA tenderness  Lungs:     Clear to auscultation bilaterally, respirations unlabored  Chest wall:    No tenderness or deformity  Heart:    Regular rate and rhythm, S1 and S2 normal, no murmur, rub   or gallop  Abdomen:     Soft, non-tender,  bowel sounds active all four quadrants,    no masses, no organomegaly  Genitalia:    deferred  Rectal:    deferred  Extremities:   Extremities normal, atraumatic, no cyanosis or edema  Pulses:   2+ and symmetric all extremities  Skin:   Skin color, texture, turgor  normal, no rashes or lesions  Lymph nodes:   Cervical, supraclavicular, and axillary nodes normal  Neurologic:   CNII-XII intact. Normal strength, sensation and reflexes      throughout    Activities of Daily Living In your present state of health, do you have any difficulty performing the following activities: 04/18/2018  Hearing? N  Vision? N  Difficulty concentrating or making decisions? N  Walking or climbing stairs? N  Dressing or bathing? N  Doing errands, shopping? N  Preparing Food and eating ? N  Using the Toilet? N  In the past six months, have you accidently leaked urine? N  Do you have problems with loss of bowel control? N  Managing your Medications? N  Managing your Finances? N  Housekeeping or managing your Housekeeping? N  Some recent data might be hidden    Fall Risk Assessment Fall Risk  04/18/2018 02/01/2017 01/27/2016 09/04/2015  Falls in the past year? No No No No     Depression Screen PHQ 2/9 Scores 04/18/2018 05/22/2017 02/01/2017 02/01/2017  PHQ - 2 Score 1 2 1 1   PHQ- 9 Score - - 1 -      Assessment & Plan:    Annual Physical Reviewed patient's Family Medical History Reviewed and updated list of patient's medical providers Assessment of cognitive impairment was done Assessed patient's functional ability Established a written schedule for health screening services Health Risk Assessent Completed and Reviewed  Exercise Activities and Dietary recommendations Goals    . DIET - INCREASE WATER INTAKE     Recommend increasing water intake to 6-8 glasses a day.     . Exercise 150 minutes per week (moderate activity)       Immunization History  Administered Date(s) Administered  .  Influenza, High Dose Seasonal PF 06/19/2017  . Influenza,inj,Quad PF,6+ Mos 08/25/2014, 09/04/2015  . Pneumococcal Conjugate-13 01/27/2016  . Pneumococcal Polysaccharide-23 09/09/2013  . Td 10/24/1997    Health Maintenance  Topic Date Due  . TETANUS/TDAP  10/25/2007  . FOOT EXAM  02/01/2018  . HEMOGLOBIN A1C  04/09/2018  . INFLUENZA VACCINE  05/24/2018  . PNA vac Low Risk Adult (2 of 2 - PPSV23) 09/09/2018  . OPHTHALMOLOGY EXAM  12/20/2018  . COLONOSCOPY  08/25/2020  . Hepatitis C Screening  Completed     Discussed health benefits of physical activity, and encouraged him to engage in regular exercise appropriate for his age and condition.    ------------------------------------------------------------------------------------------------------------  1. Annual physical exam   2. Essential (primary) hypertension Well controlled.  Continue current medications.   - EKG 12-Lead  3. Restless leg Try chang from ropinirole to - pramipexole (MIRAPEX) 0.25 MG tablet; Take 1/2 tablet at bedtime for 4 days, then 1 tablet at bedtime for 4 days, then increase to 2 tablets at bedtime if needed  Dispense: 60 tablet; Refill: 1 - CBC Can try supplemental OTC iron   4. History of pernicious anemia   5. Controlled type 2 diabetes mellitus without complication, without long-term current use of insulin (HCC)  - Hemoglobin A1c - EKG 12-Lead  6. Prostate cancer screening  - PSA  7. B12 deficiency  - CBC   Mila Merry, MD  [facility]

## 2018-04-25 NOTE — Patient Instructions (Addendum)
   Try taking one to two iron sulfate 325mg  tablets every day. This may cause constipation.   Your are due for a Tdap (tetanus-diptheria-pertussis vaccine) and Shingrix (shingles vaccine)

## 2018-04-26 LAB — CBC
Hematocrit: 43.3 % (ref 37.5–51.0)
Hemoglobin: 14.4 g/dL (ref 13.0–17.7)
MCH: 29.9 pg (ref 26.6–33.0)
MCHC: 33.3 g/dL (ref 31.5–35.7)
MCV: 90 fL (ref 79–97)
PLATELETS: 245 10*3/uL (ref 150–450)
RBC: 4.81 x10E6/uL (ref 4.14–5.80)
RDW: 12.2 % — ABNORMAL LOW (ref 12.3–15.4)
WBC: 6.5 10*3/uL (ref 3.4–10.8)

## 2018-04-26 LAB — PSA: PROSTATE SPECIFIC AG, SERUM: 2.2 ng/mL (ref 0.0–4.0)

## 2018-04-26 LAB — HEMOGLOBIN A1C
Est. average glucose Bld gHb Est-mCnc: 140 mg/dL
Hgb A1c MFr Bld: 6.5 % — ABNORMAL HIGH (ref 4.8–5.6)

## 2018-05-19 ENCOUNTER — Other Ambulatory Visit: Payer: Self-pay | Admitting: Family Medicine

## 2018-05-19 DIAGNOSIS — G2581 Restless legs syndrome: Secondary | ICD-10-CM

## 2018-05-21 NOTE — Telephone Encounter (Signed)
Patient states he is now taking 2 tablets at bedtime. He states 2 tablets became effective with restless leg syndrome approximately 2 weeks after starting. He would like to continue same regimen. He is requesting a 90 day supply be sent to pharmacy.

## 2018-05-21 NOTE — Telephone Encounter (Signed)
Please check with patient to see if he is taking 1 or 2 tablets at  Bedtime, and if does has been effective for restless legs.

## 2018-05-21 NOTE — Telephone Encounter (Signed)
Please advise, have sent prescription for 0.5mg  so he will only need to take one tablet each nigh.

## 2018-05-21 NOTE — Telephone Encounter (Signed)
Patient advised.

## 2018-07-22 ENCOUNTER — Other Ambulatory Visit: Payer: Self-pay | Admitting: Family Medicine

## 2018-07-22 DIAGNOSIS — E538 Deficiency of other specified B group vitamins: Secondary | ICD-10-CM

## 2018-07-31 ENCOUNTER — Other Ambulatory Visit: Payer: Self-pay | Admitting: Family Medicine

## 2018-08-01 ENCOUNTER — Telehealth: Payer: Self-pay | Admitting: Family Medicine

## 2018-08-01 DIAGNOSIS — E119 Type 2 diabetes mellitus without complications: Secondary | ICD-10-CM

## 2018-08-01 MED ORDER — ONETOUCH ULTRA 2 W/DEVICE KIT: PACK | 1 refills | Status: AC

## 2018-08-01 MED ORDER — GLUCOSE BLOOD VI STRP: ORAL_STRIP | 4 refills | Status: DC

## 2018-08-01 NOTE — Telephone Encounter (Signed)
Needing a  Blood Glucose Meter and test strips for 90 days. Please call pt if there are any questions.   [id number] - [place], [place] - [address] [phone] (Phone) [phone] (Fax)   Thanks, [employer]

## 2018-10-30 ENCOUNTER — Ambulatory Visit (INDEPENDENT_AMBULATORY_CARE_PROVIDER_SITE_OTHER): Payer: PPO | Admitting: Family Medicine

## 2018-10-30 VITALS — BP 134/72 | HR 60 | Temp 97.7°F | Resp 16 | Ht 64.0 in | Wt 136.0 lb

## 2018-10-30 DIAGNOSIS — E119 Type 2 diabetes mellitus without complications: Secondary | ICD-10-CM

## 2018-10-30 DIAGNOSIS — G2581 Restless legs syndrome: Secondary | ICD-10-CM | POA: Diagnosis not present

## 2018-10-30 DIAGNOSIS — E7801 Familial hypercholesterolemia: Secondary | ICD-10-CM | POA: Diagnosis not present

## 2018-10-30 DIAGNOSIS — I10 Essential (primary) hypertension: Secondary | ICD-10-CM

## 2018-10-30 DIAGNOSIS — E538 Deficiency of other specified B group vitamins: Secondary | ICD-10-CM | POA: Diagnosis not present

## 2018-10-30 LAB — POCT UA - MICROALBUMIN: MICROALBUMIN (UR) POC: 20 mg/L

## 2018-10-30 LAB — POCT GLYCOSYLATED HEMOGLOBIN (HGB A1C): HEMOGLOBIN A1C: 7.6 % — AB (ref 4.0–5.6)

## 2018-10-30 NOTE — Progress Notes (Signed)
Patient: Jeremiah Meyer Male    DOB: 18-Aug-1951   68 y.o.   MRN: [id number] Visit Date: 10/30/2018  Today's Provider: Lelon Huh, MD   Chief Complaint  Patient presents with  . Diabetes  . Hypertension  . Hyperlipidemia   Subjective:     HPI    Diabetes Mellitus Type II, Follow-up:   Lab Results  Component Value Date   HGBA1C 6.5 (H) 04/25/2018   HGBA1C 6.1 10/09/2017   HGBA1C 6.5 06/19/2017   Last seen for diabetes 6 months ago.  Management since then includes No changes. He reports fair compliance with treatment.  Pt states he stopped Januvia secondary to cost since he has been in the doughnut hole the last 3 months.  He states he will restart today. He is not having side effects.  Current symptoms include increase appetite and have been worsening Since restarting the Glipizide Home blood sugar records: trend: increasing steadily  Episodes of hypoglycemia? no   Current Insulin Regimen: None Most Recent Eye Exam: UTD Weight trend: stable  Current diet: in general, a "healthy" diet   Current exercise: some  ------------------------------------------------------------------------   Hypertension, follow-up:  BP Readings from Last 3 Encounters:  10/30/18 134/72  04/25/18 120/70  04/18/18 128/64    He was last seen for hypertension 6 months ago.  BP at that visit was 120/70. Management since that visit includes No changes .He reports excellent compliance with treatment. He is not having side effects.  He is exercising. He is not adherent to low salt diet.   Outside blood pressures are not being checked. He is experiencing none.  Patient denies chest pain, dyspnea, exertional chest pressure/discomfort, irregular heart beat, lower extremity edema, near-syncope and palpitations.   Cardiovascular risk factors include advanced age (older than 31 for men, 80 for women), diabetes mellitus, dyslipidemia, hypertension and male gender.  Use of agents  associated with hypertension: none.   ------------------------------------------------------------------------    Lipid/Cholesterol, Follow-up:   Last seen for this 12 months ago.  Management since that visit includes None.  Last Lipid Panel:    Component Value Date/Time   CHOL 150 10/09/2017 0926   CHOL 140 08/22/2016 0927   TRIG 143 10/09/2017 0926   HDL 42 10/09/2017 0926   HDL 49 08/22/2016 0927   CHOLHDL 3.6 10/09/2017 0926   LDLCALC 84 10/09/2017 0926    He reports excellent compliance with treatment. He is not having side effects.   Wt Readings from Last 3 Encounters:  10/30/18 136 lb (61.7 kg)  04/25/18 130 lb (59 kg)  04/18/18 130 lb 3.2 oz (59.1 kg)    ------------------------------------------------------------------------   He also is here for follow RLS, last seen in July when we changed ropinirole to pramipexole, which he states is working much better and is having no adverse effects. He would like to continue current medication.    No Known Allergies   Current Outpatient Medications:  .  aspirin 81 MG tablet, Take 81 mg by mouth daily., Disp: , Rfl:  .  betamethasone dipropionate (DIPROLENE) 0.05 % cream, Apply topically daily. (Patient taking differently: Apply topically daily. ), Disp: 45 g, Rfl: 1 .  Blood Glucose Monitoring Suppl (ONE TOUCH ULTRA 2) w/Device KIT, Use to check blood sugar daily for type 2 diabetes, Disp: 1 each, Rfl: 1 .  citalopram (CELEXA) 20 MG tablet, TAKE 1 TABLET BY MOUTH EVERY DAY, Disp: 90 tablet, Rfl: 2 .  cyanocobalamin (,VITAMIN B-12,) 1000 MCG/ML injection,  INJECT 1 MLS MONTHLY AS DIRECTED, Disp: 3 mL, Rfl: 4 .  fluticasone (FLONASE) 50 MCG/ACT nasal spray, Place 2 sprays into both nostrils daily. (Patient taking differently: Place 2 sprays into both nostrils daily. ), Disp: 16 g, Rfl: 3 .  glipiZIDE (GLUCOTROL XL) 2.5 MG 24 hr tablet, TAKE 1 TABLET (2.5 MG TOTAL) BY MOUTH DAILY WITH BREAKFAST., Disp: 90 tablet, Rfl: 4 .   glucose blood (ONE TOUCH ULTRA TEST) test strip, USE 1 STRIP TO CHECK BLOOD SUGAR ONCE DAILY, Disp: 100 each, Rfl: 4 .  lisinopril-hydrochlorothiazide (PRINZIDE,ZESTORETIC) 10-12.5 MG tablet, TAKE 1 TABLET BY MOUTH EVERY DAY, Disp: 90 tablet, Rfl: 3 .  meloxicam (MOBIC) 15 MG tablet, Take 1 tablet (15 mg total) by mouth daily as needed., Disp: 90 tablet, Rfl: 1 .  metFORMIN (GLUCOPHAGE) 1000 MG tablet, TAKE 1 TABLET TWICE A DAY, Disp: 180 tablet, Rfl: 4 .  Multiple Vitamin tablet, Take 1 tablet by mouth daily., Disp: , Rfl:  .  pramipexole (MIRAPEX) 0.5 MG tablet, Take 1 tablet (0.5 mg total) by mouth at bedtime., Disp: 90 tablet, Rfl: 3 .  simvastatin (ZOCOR) 20 MG tablet, TAKE 1 TABLET BY MOUTH EVERY DAY, Disp: 90 tablet, Rfl: 4 .  JANUVIA 100 MG tablet, TAKE 1 TABLET (100 MG TOTAL) BY MOUTH DAILY. (Patient not taking: Reported on 10/30/2018), Disp: 90 tablet, Rfl: 4  Review of Systems  Constitutional: Negative.   Respiratory: Negative.   Cardiovascular: Negative.   Gastrointestinal: Negative.   Musculoskeletal: Negative.   Neurological: Negative for dizziness, light-headedness and headaches.    Social History   Tobacco Use  . Smoking status: Former Smoker    Types: Cigarettes    Last attempt to quit: 10/24/1984    Years since quitting: 34.0  . Smokeless tobacco: Never Used  . Tobacco comment: Quit 1986  Substance Use Topics  . Alcohol use: Yes    Comment: beer 1-2 monthly      Objective:   BP 134/72 (BP Location: Right Arm, Patient Position: Sitting, Cuff Size: Normal)   Pulse 60   Temp 97.7 F (36.5 C) (Oral)   Resp 16   Ht '5\' 4"'$  (1.626 m)   Wt 136 lb (61.7 kg)   BMI 23.34 kg/m  Vitals:   10/30/18 0828  BP: 134/72  Pulse: 60  Resp: 16  Temp: 97.7 F (36.5 C)  TempSrc: Oral  Weight: 136 lb (61.7 kg)  Height: '5\' 4"'$  (1.626 m)     Physical Exam   General Appearance:    Alert, cooperative, no distress  Eyes:    PERRL, conjunctiva/corneas clear, EOM's intact        Lungs:     Clear to auscultation bilaterally, respirations unlabored  Heart:    Regular rate and rhythm  Neurologic:   Awake, alert, oriented x 3. No apparent focal neurological           defect.       Results for orders placed or performed in visit on 10/30/18  POCT glycosylated hemoglobin (Hb A1C)  Result Value Ref Range   Hemoglobin A1C 7.6 (A) 4.0 - 5.6 %       Assessment & Plan    1. Essential (primary) hypertension Well controlled.  Continue current medications.    2. Controlled type 2 diabetes mellitus without complication, without long-term current use of insulin ([place]) Up since being off of Januvia, but he has just restarted this medication.  - POCT glycosylated hemoglobin (Hb A1C)  3. Essential familial  hypercholesterolemia He is tolerating simvastatin well with no adverse effects.   - Comprehensive metabolic panel - Lipid panel - CBC  4. B12 deficiency  - CBC  5. RLS Much better control since change to pramipexole. Continue current medications.    He states he had flu vaccine at pharmacy this flu season and declined vaccine today.   Follow up 6 months AWV, CPE and diabetes follow up.     Lelon Huh, MD  [facility]

## 2018-10-30 NOTE — Patient Instructions (Signed)
.   You are due for a Tdap (tetanus-diptheria-pertussis vaccine) which protects you from tetanus and whooping cough. Please check with your insurance plan or pharmacy regarding coverage for this vaccine.    The CDC recommends two doses of Shingrix (the shingles vaccine) separated by 2 to 6 months for adults age 68 years and older. I recommend checking with your pharmacy plan regarding coverage for this vaccine.

## 2018-10-31 LAB — COMPREHENSIVE METABOLIC PANEL
ALBUMIN: 4.3 g/dL (ref 3.6–4.8)
ALT: 17 IU/L (ref 0–44)
AST: 15 IU/L (ref 0–40)
Albumin/Globulin Ratio: 2 (ref 1.2–2.2)
Alkaline Phosphatase: 70 IU/L (ref 39–117)
BUN / CREAT RATIO: 15 (ref 10–24)
BUN: 15 mg/dL (ref 8–27)
Bilirubin Total: 0.6 mg/dL (ref 0.0–1.2)
CO2: 26 mmol/L (ref 20–29)
CREATININE: 1.03 mg/dL (ref 0.76–1.27)
Calcium: 9.5 mg/dL (ref 8.6–10.2)
Chloride: 98 mmol/L (ref 96–106)
GFR calc Af Amer: 86 mL/min/{1.73_m2} (ref >59)
GFR, EST NON AFRICAN AMERICAN: 75 mL/min/{1.73_m2} (ref >59)
GLOBULIN, TOTAL: 2.2 g/dL (ref 1.5–4.5)
Glucose: 199 mg/dL — ABNORMAL HIGH (ref 65–99)
Potassium: 4.6 mmol/L (ref 3.5–5.2)
SODIUM: 140 mmol/L (ref 134–144)
Total Protein: 6.5 g/dL (ref 6.0–8.5)

## 2018-10-31 LAB — LIPID PANEL
CHOL/HDL RATIO: 3 ratio (ref 0.0–5.0)
Cholesterol, Total: 142 mg/dL (ref 100–199)
HDL: 48 mg/dL (ref >39)
LDL CALC: 78 mg/dL (ref 0–99)
Triglycerides: 78 mg/dL (ref 0–149)
VLDL Cholesterol Cal: 16 mg/dL (ref 5–40)

## 2018-10-31 LAB — CBC
HEMOGLOBIN: 15.2 g/dL (ref 13.0–17.7)
Hematocrit: 44.5 % (ref 37.5–51.0)
MCH: 30.8 pg (ref 26.6–33.0)
MCHC: 34.2 g/dL (ref 31.5–35.7)
MCV: 90 fL (ref 79–97)
Platelets: 232 10*3/uL (ref 150–450)
RBC: 4.94 x10E6/uL (ref 4.14–5.80)
RDW: 12.7 % (ref 11.6–15.4)
WBC: 6.7 10*3/uL (ref 3.4–10.8)

## 2019-01-13 ENCOUNTER — Other Ambulatory Visit: Payer: Self-pay | Admitting: Family Medicine

## 2019-01-13 DIAGNOSIS — E119 Type 2 diabetes mellitus without complications: Secondary | ICD-10-CM

## 2019-01-22 ENCOUNTER — Other Ambulatory Visit: Payer: Self-pay | Admitting: Family Medicine

## 2019-01-22 DIAGNOSIS — E119 Type 2 diabetes mellitus without complications: Secondary | ICD-10-CM

## 2019-01-23 ENCOUNTER — Other Ambulatory Visit: Payer: Self-pay | Admitting: Family Medicine

## 2019-01-23 DIAGNOSIS — E7801 Familial hypercholesterolemia: Secondary | ICD-10-CM

## 2019-01-23 DIAGNOSIS — I10 Essential (primary) hypertension: Secondary | ICD-10-CM

## 2019-02-25 IMAGING — US US ABDOMEN COMPLETE
1 series · 13 of 25 positions shown · non-contrast
Comparison: None.

CLINICAL DATA: Abdominal discomfort with chronic nausea

EXAM:
ABDOMEN ULTRASOUND COMPLETE

[Series 1: us abdomen complete · 0.26mm/px · 13 of 88 slices shown]
[im 1/88]
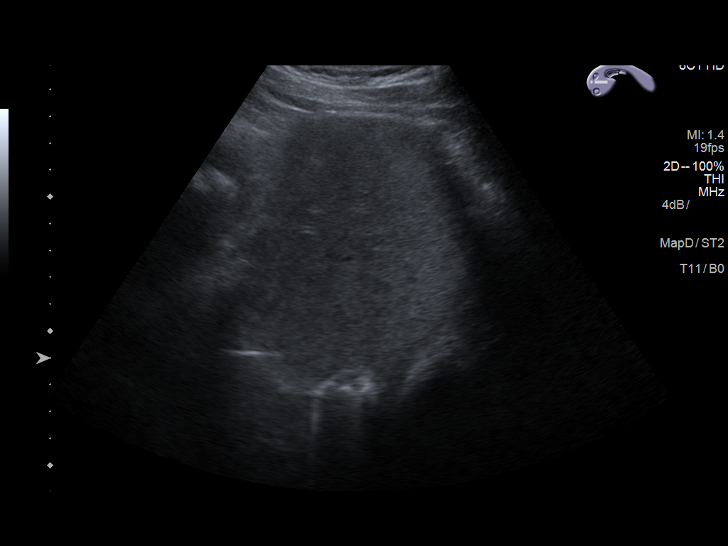
[im 8/88]
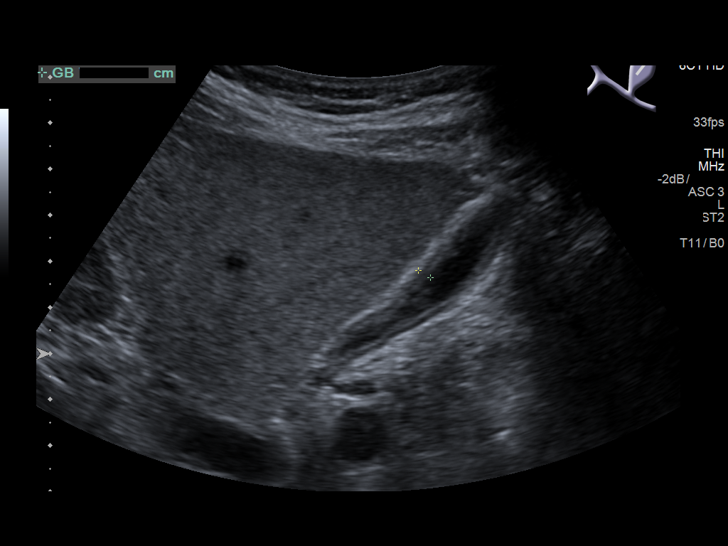
[im 15/88]
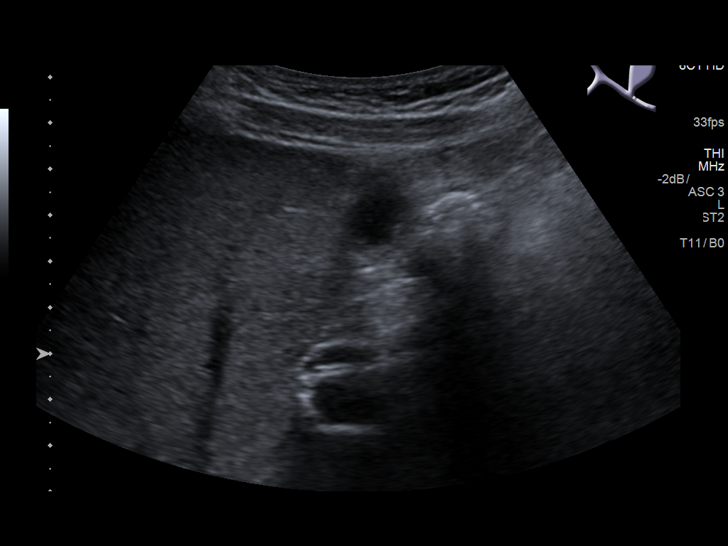
[im 22/88]
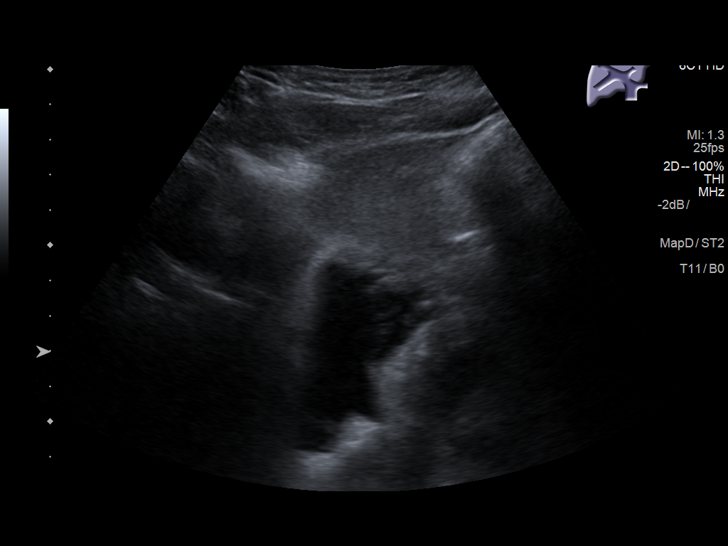
[im 30/88]
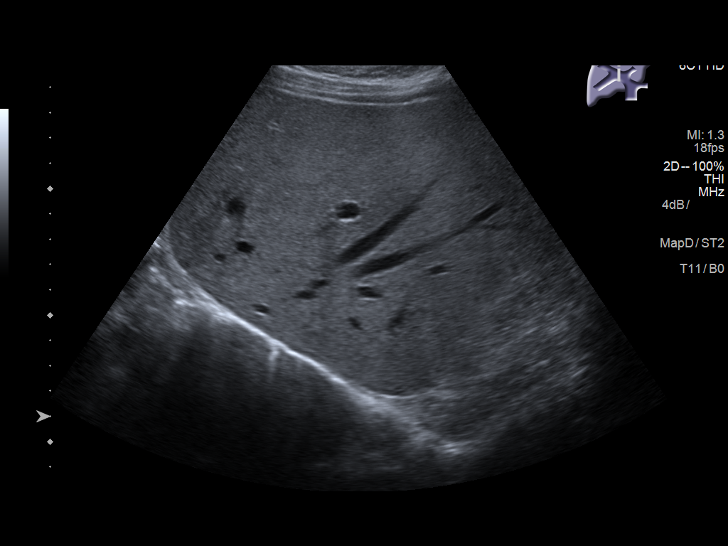
[im 37/88]
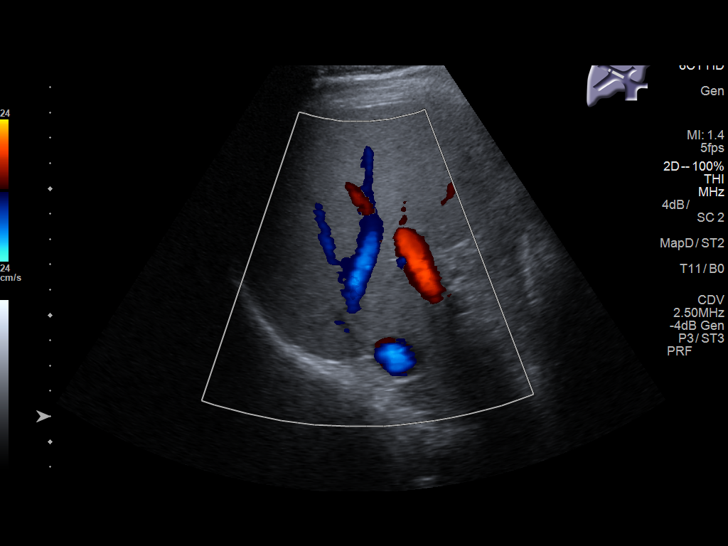
[im 44/88]
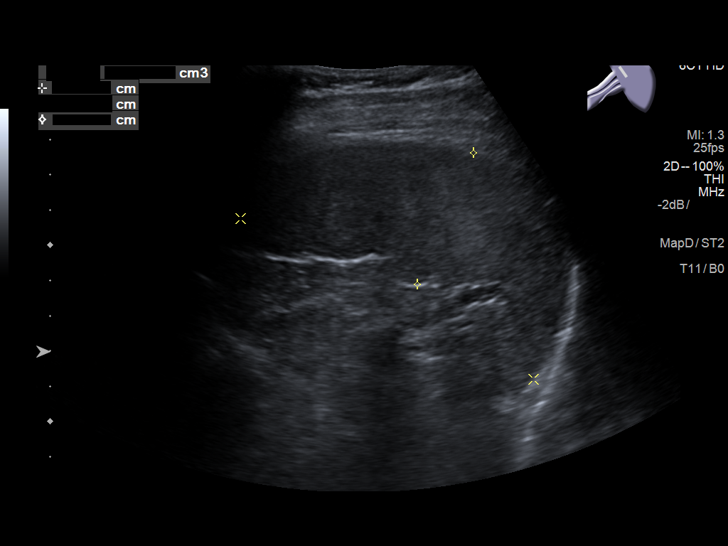
[im 51/88]
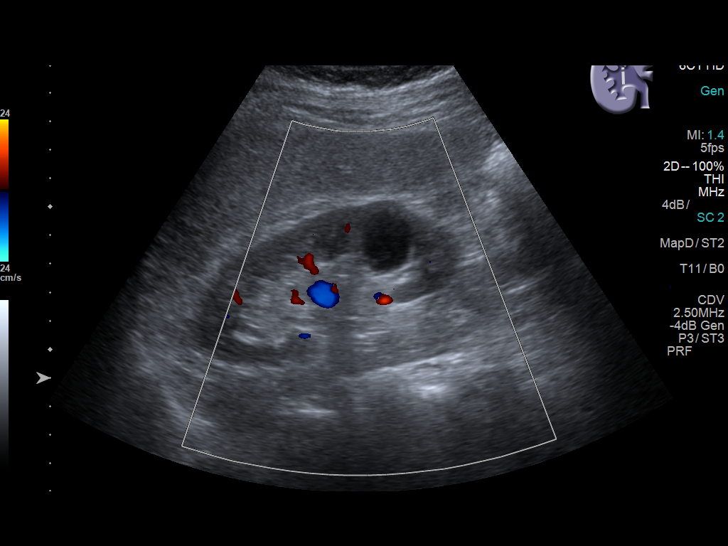
[im 59/88]
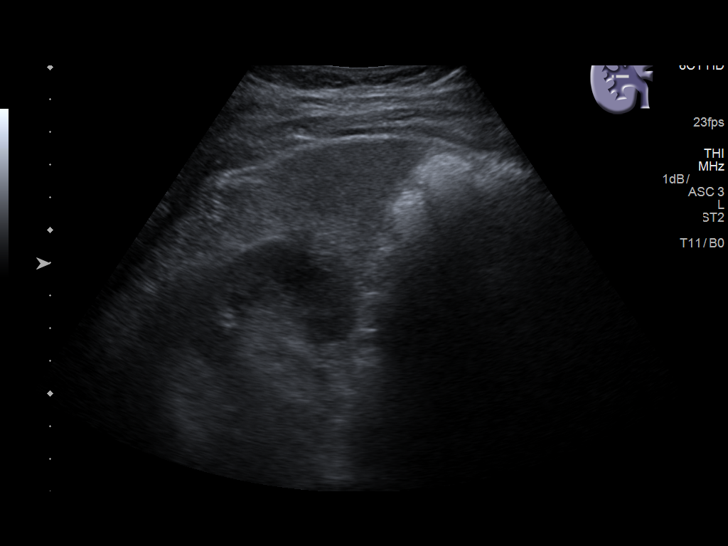
[im 66/88]
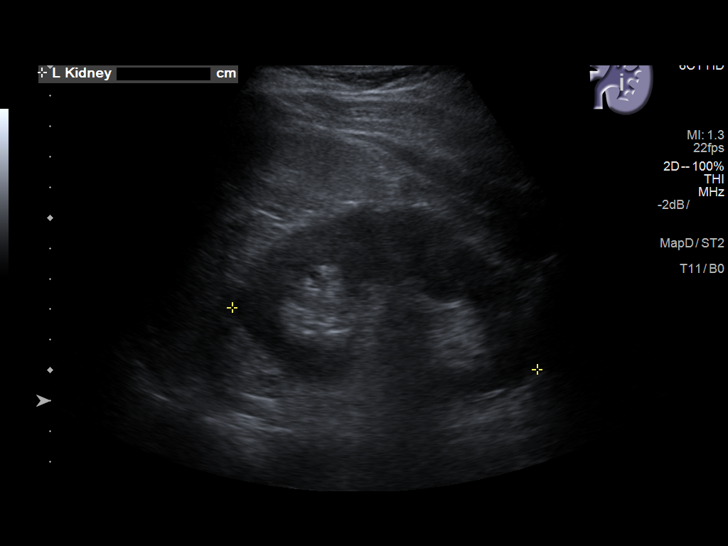
[im 73/88]
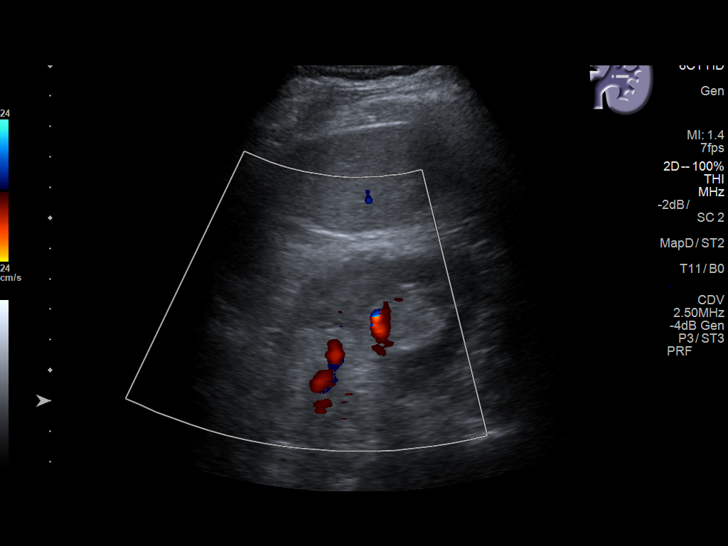
[im 80/88]
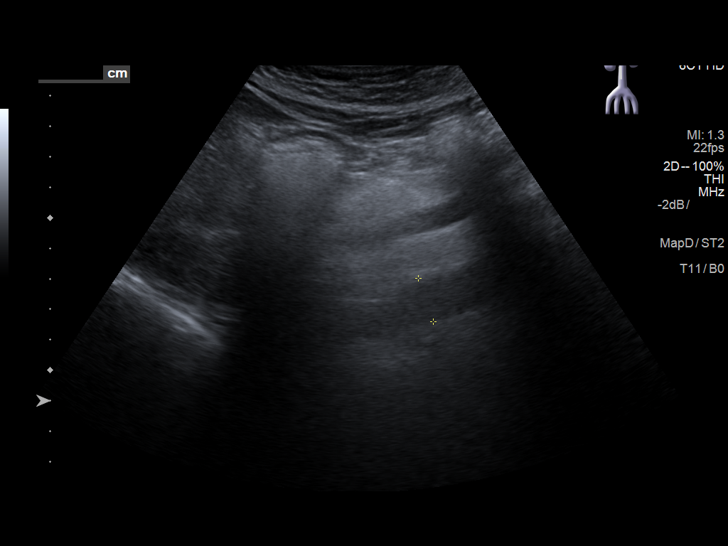
[im 88/88]
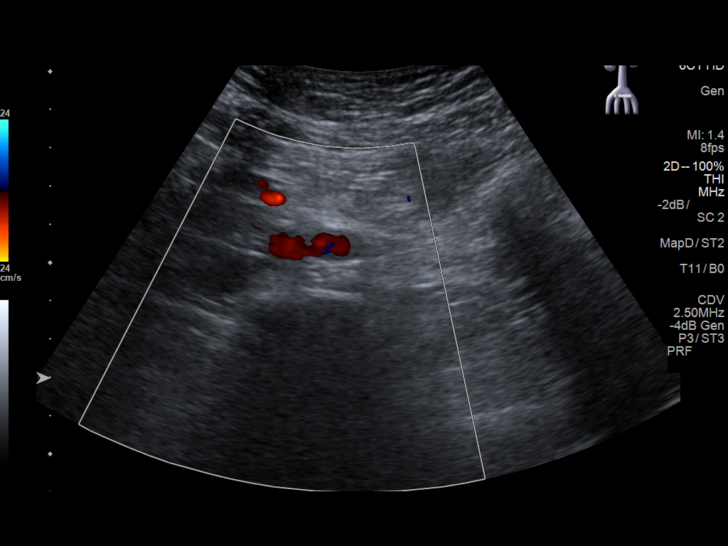

[13 of 25 positions shown; findings below may reference images not displayed]

FINDINGS: Gallbladder: No gallstones are evident. Note that the gallbladder
appears mildly contracted with gallbladder wall thickness upper
normal. No gallbladder wall edema or pericholecystic fluid.. No
sonographic Murphy sign noted by sonographer.

Common bile duct: Diameter: 4 mm. No intrahepatic, common hepatic,
or common bile duct dilatation.

Liver: No focal lesion identified. Within normal limits in
parenchymal echogenicity. Portal vein is patent on color Doppler
imaging with normal direction of blood flow towards the liver.

IVC: No abnormality visualized.

Pancreas: Visualized portion unremarkable. Portions of the pancreas
obscured by gas.

Spleen: Size and appearance within normal limits.

Right Kidney: Length: 9.7 cm. Echogenicity within normal limits. No
hydronephrosis visualized. There is a cyst arising from of the lower
pole right kidney measuring 2.2 x 1.8 x 2.1 cm.

Left Kidney: Length: 10.2 cm. Echogenicity within normal limits. No
mass or hydronephrosis visualized.

Abdominal aorta: No aneurysm visualized.

Other findings: No demonstrable ascites.
IMPRESSION: 1. The gallbladder wall is upper normal in thickness. Gallbladder
appears mildly contracted. No gallstones or gallbladder wall edema
evident. Assuming patient has been NPO for a minimum of 8 hours,
this appearance of gallbladder is considered mildly abnormal. This
finding may warrant nuclear medicine hepatobiliary imaging study to
assess for cystic duct patency.

2. Portions of pancreas obscured by gas. Visualized portions of
pancreas appear normal.

3.  Simple cyst lower pole right kidney.

4.  Study otherwise unremarkable.

## 2019-04-22 ENCOUNTER — Ambulatory Visit: Payer: PPO

## 2019-05-02 ENCOUNTER — Other Ambulatory Visit: Payer: Self-pay

## 2019-05-02 ENCOUNTER — Ambulatory Visit (INDEPENDENT_AMBULATORY_CARE_PROVIDER_SITE_OTHER): Payer: PPO

## 2019-05-02 DIAGNOSIS — Z0000 Encounter for general adult medical examination without abnormal findings: Secondary | ICD-10-CM | POA: Diagnosis not present

## 2019-05-02 NOTE — Progress Notes (Signed)
Subjective:   Jeremiah Meyer is a 68 y.o. male who presents for Medicare Annual/Subsequent preventive examination.    This visit is being conducted through telemedicine due to the COVID-19 pandemic. This patient has given me verbal consent via doximity to conduct this visit, patient states they are participating from their home address. Some vital signs may be absent or patient reported.    Patient identification: identified by name, DOB, and current address  Review of Systems:  N/A  Cardiac Risk Factors include: advanced age (>49mn, >>20women);diabetes mellitus;dyslipidemia;hypertension;male gender     Objective:    Vitals: There were no vitals taken for this visit.  There is no height or weight on file to calculate BMI. Unable to obtain vitals due to visit being conducted via telephonically.   Advanced Directives 05/02/2019 04/18/2018 02/01/2017 04/04/2016 01/27/2016 12/04/2015 06/22/2015  Does Patient Have a Medical Advance Directive? Yes _0  No  Type of AParamedicof AProgress VillageLiving will - - - - - -  Copy of [place] Chart? No - copy requested - - - - - -  Would patient like information on creating a medical advance directive? - No - Patient declined Yes (ED - Information included in AVS) - - - -    Tobacco Social History   Tobacco Use  Smoking Status Former Smoker  . Types: Cigarettes  . Quit date: 10/24/1984  . Years since quitting: 34.5  Smokeless Tobacco Never Used  Tobacco Comment   Quit 1986     Counseling given: Not Answered Comment: Quit 1986   Clinical Intake:  Pre-visit preparation completed: Yes  Pain : No/denies pain Pain Score: 0-No pain     Diabetes: Yes  How often do you need to have someone help you when you read instructions, pamphlets, or other written materials from your doctor or pharmacy?: 1 - Never   Diabetes:  Is the patient diabetic?  Yes type 2 If diabetic, was a CBG obtained  today?  No  Did the patient bring in their glucometer from home?  No  How often do you monitor your CBG's? Four to five times daily currently.   Financial Strains and Diabetes Management:  Are you having any financial strains with the device, your supplies or your medication? No .  Does the patient want to be seen by Chronic Care Management for management of their diabetes?  No  Would the patient like to be referred to a Nutritionist or for Diabetic Management?  No   Diabetic Exams:  Diabetic Eye Exam: Completed 12/20/17. Overdue for diabetic eye exam. Pt has been advised about the importance in completing this exam. Pt plans to schedule an eye exam this year.   Diabetic Foot Exam: Completed 02/01/17. Pt has been advised about the importance in completing this exam. Note made to follow up on this at next in office visit.    Interpreter Needed?: No  Information [facility] :: [facility]  Past Medical History:  Diagnosis Date  . Anemia   . Cyst, pilonidal, with abscess 06/12/2015  . Diabetes mellitus without complication ([place]    type 2  . Hemorrhoid 06/12/2015  . Hip strain   . Hyperlipidemia   . Hypertension    Past Surgical History:  Procedure Laterality Date  . LUNG SURGERY Left 1985   S/P RECURRENT PNEUMOTHORAX  . VASECTOMY  1981   Family History  Problem Relation Age of Onset  . Cancer Father  liver  . Cancer Brother        stomach and intestine  . Diabetes Brother    Social History   Socioeconomic History  . Marital status: Married    Spouse name: Raiford Noble  . Number of children: 1  . Years of education: Not on file  . Highest education level: Associate degree: occupational, Hotel manager, or vocational program  Occupational History  . 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 Financial resource strain: Not hard at all  . Food insecurity    Worry: Never true    Inability: Never true  . Transportation  needs    Medical: No    Non-medical: No  Tobacco Use  . Smoking status: Former Smoker    Types: Cigarettes    Quit date: 10/24/1984    Years since quitting: 34.5  . Smokeless tobacco: Never Used  . Tobacco comment: Quit 1986  Substance and Sexual Activity  . Alcohol use: Yes    Comment: beer 1-2 monthly  . Drug use: Yes    Types: Marijuana  . Sexual activity: Not on file  Lifestyle  . Physical activity    Days per week: 0 days    Minutes per session: 0 min  . Stress: Not at all  Relationships  . Social Herbalist on phone: Patient refused    Gets together: Patient refused    Attends religious service: Patient refused    Active member of club or organization: Patient refused    Attends meetings of clubs or organizations: Patient refused    Relationship status: Patient refused  Other Topics Concern  . Not on file  Social History Narrative   Has one son who lives in [place]    Outpatient Encounter Medications as of 05/02/2019  Medication Sig  . aspirin 81 MG tablet Take 81 mg by mouth daily.  . betamethasone dipropionate (DIPROLENE) 0.05 % cream Apply topically daily. (Patient taking differently: Apply topically daily. )  . Blood Glucose Monitoring Suppl (ONE TOUCH ULTRA 2) w/Device KIT Use to check blood sugar daily for type 2 diabetes  . cyanocobalamin (,VITAMIN B-12,) 1000 MCG/ML injection INJECT 1 MLS MONTHLY AS DIRECTED  . fluticasone (FLONASE) 50 MCG/ACT nasal spray Place 2 sprays into both nostrils daily. (Patient taking differently: Place 2 sprays into both nostrils daily. )  . glipiZIDE (GLUCOTROL XL) 2.5 MG 24 hr tablet TAKE 1 TABLET (2.5 MG TOTAL) BY MOUTH DAILY WITH BREAKFAST.  Marland Kitchen glucose blood (ONE TOUCH ULTRA TEST) test strip USE 1 STRIP TO CHECK BLOOD SUGAR ONCE DAILY  . JANUVIA 100 MG tablet TAKE 1 TABLET BY MOUTH EVERY DAY  . lisinopril-hydrochlorothiazide (PRINZIDE,ZESTORETIC) 10-12.5 MG tablet Take 1 tablet by mouth daily.  . meloxicam (MOBIC) 15  MG tablet Take 1 tablet (15 mg total) by mouth daily as needed.  . metFORMIN (GLUCOPHAGE) 1000 MG tablet TAKE 1 TABLET BY MOUTH TWICE A DAY  . Multiple Vitamin tablet Take 1 tablet by mouth daily.  . pramipexole (MIRAPEX) 0.5 MG tablet Take 1 tablet (0.5 mg total) by mouth at bedtime.  . simvastatin (ZOCOR) 20 MG tablet Take 1 tablet (20 mg total) by mouth daily.  . citalopram (CELEXA) 20 MG tablet TAKE 1 TABLET BY MOUTH EVERY DAY (Patient not taking: Reported on 05/02/2019)   No facility-administered encounter medications on file as of 05/02/2019.     Activities of Daily Living In your present state of health, do you have  any difficulty performing the following activities: 05/02/2019  Hearing? N  Vision? N  Difficulty concentrating or making decisions? N  Walking or climbing stairs? Y  Comment Due to knee pains.  Dressing or bathing? N  Doing errands, shopping? N  Preparing Food and eating ? N  Using the Toilet? N  In the past six months, have you accidently leaked urine? N  Do you have problems with loss of bowel control? N  Managing your Medications? N  Managing your Finances? N  Housekeeping or managing your Housekeeping? N  Some recent data might be hidden    Patient Care Team: Birdie Sons, MD as PCP - General (Family Medicine) Anell Barr, OD as Consulting Physician (Optometry)   Assessment:   This is a routine wellness examination for Jeremiah Meyer.  Exercise Activities and Dietary recommendations Current Exercise Habits: The patient does not participate in regular exercise at present, Exercise limited by: None identified  Goals    . DIET - INCREASE WATER INTAKE     Recommend increasing water intake to 6-8 glasses a day.     . Exercise 150 minutes per week (moderate activity)    . Increase water intake     Recommend increasing water intake to 4 glasses a day.       Fall Risk Fall Risk  05/02/2019 04/18/2018 02/01/2017 01/27/2016 09/04/2015  Falls in the past year? 0 No  No No No   FALL RISK PREVENTION PERTAINING TO THE HOME:  Any stairs in or around the home? Yes  If so, are there any without handrails? No   Home free of loose throw rugs in walkways, pet beds, electrical cords, etc? Yes  Adequate lighting in your home to reduce risk of falls? Yes   ASSISTIVE DEVICES UTILIZED TO PREVENT FALLS:  Life alert? No  Use of a cane, walker or w/c? No  Grab bars in the bathroom? No  Shower chair or bench in shower? No  Elevated toilet seat or a handicapped toilet? No    TIMED UP AND GO:  Was the test performed? No .    Depression Screen PHQ 2/9 Scores 05/02/2019 04/18/2018 05/22/2017 02/01/2017  PHQ - 2 Score _0 PHQ- 9 Score - - - 1    Cognitive Function: Declined today.     6CIT Screen 02/01/2017  What Year? 0 points  What month? 0 points  What time? 0 points  Count back from 20 0 points  Months in reverse 0 points  Repeat phrase 4 points  Total Score 4    Immunization History  Administered Date(s) Administered  . Influenza, High Dose Seasonal PF 06/19/2017, 08/14/2018  . Influenza,inj,Quad PF,6+ Mos 08/25/2014, 09/04/2015  . Pneumococcal Conjugate-13 01/27/2016  . Pneumococcal Polysaccharide-23 09/09/2013  . Td 10/24/1997  . Tdap 12/06/2018    Qualifies for Shingles Vaccine? Yes . Due for Shingrix. Education has been provided regarding the importance of this vaccine. Pt has been advised to call insurance company to determine out of pocket expense. Advised may also receive vaccine at local pharmacy or Health Dept. Verbalized acceptance and understanding.  Tdap: Up to date  Flu Vaccine:Up to date  Pneumococcal Vaccine: Due for Pneumococcal vaccine. Does the patient want to receive this vaccine today?  No .    Screening Tests Health Maintenance  Topic Date Due  . FOOT EXAM  02/01/2018  . PNA vac Low Risk Adult (2 of 2 - PPSV23) 09/09/2018  . OPHTHALMOLOGY EXAM  12/20/2018  .  HEMOGLOBIN A1C  04/30/2019  . INFLUENZA VACCINE   05/25/2019  . COLONOSCOPY  08/25/2020  . TETANUS/TDAP  12/06/2028  . Hepatitis C Screening  Completed   Cancer Screenings:  Colorectal Screening: Completed 08/25/10. Repeat every 10 years.  Lung Cancer Screening: (Low Dose CT Chest recommended if Age 22-80 years, 30 pack-year currently smoking OR have quit w/in 15years.) does not qualify.   Additional Screening:  Hepatitis C Screening: Up to date  Dental Screening: Recommended annual dental exams for proper oral hygiene  Community Resource Referral:  CRR required this visit?  No        Plan:  I have personally reviewed and addressed the Medicare Annual Wellness questionnaire and have noted the following in the patient's chart:  A. Medical and social history B. Use of alcohol, tobacco or illicit drugs  C. Current medications and supplements D. Functional ability and status E.  Nutritional status F.  Physical activity G. Advance directives H. List of other physicians I.  Hospitalizations, surgeries, and ER visits in previous 12 months J.  [place] such as hearing and vision if needed, cognitive and depression L. Referrals and appointments   In addition, I have reviewed and discussed with patient certain preventive protocols, quality metrics, and best practice recommendations. A written personalized care plan for preventive services as well as general preventive health recommendations were provided to patient.   Glendora Score, [place]  03/01/1637 Nurse Health Advisor  Nurse Notes: Pt needs a diabetic foot exam, Hgb A1c check and a pneumovax 23 vaccine at [place]. Pt plans to set up an eye exam once able to.

## 2019-05-02 NOTE — Patient Instructions (Signed)
Jeremiah Meyer , Thank you for taking time to come for your Medicare Wellness Visit. I appreciate your ongoing commitment to your health goals. Please review the following plan we discussed and let me know if I can assist you in the future.   Screening recommendations/referrals: Colonoscopy: Up to date, due 08/2020 Recommended yearly ophthalmology/optometry visit for glaucoma screening and checkup Recommended yearly dental visit for hygiene and checkup  Vaccinations: Influenza vaccine: Up to date Pneumococcal vaccine: Pneumovax 23 due Tdap vaccine: Up to date, due 11/2028 Shingles vaccine: Pt declines today.     Advanced directives: Please bring a copy of your POA (Power of Attorney) and/or Living Will to your next appointment.   Conditions/risks identified: Continue to increase water intake to 6-8 8 oz glasses a day.   Next appointment: 05/03/19 @ 10:00 AM with Dr Caryn Section  Preventive Care 35 Years and Older, Male Preventive care refers to lifestyle choices and visits with your health care provider that can promote health and wellness. What does preventive care include?  A yearly physical exam. This is also called an annual well check.  Dental exams once or twice a year.  Routine eye exams. Ask your health care provider how often you should have your eyes checked.  Personal lifestyle choices, including:  Daily care of your teeth and gums.  Regular physical activity.  Eating a healthy diet.  Avoiding tobacco and drug use.  Limiting alcohol use.  Practicing safe sex.  Taking low doses of aspirin every day.  Taking vitamin and mineral supplements as recommended by your health care provider. What happens during an annual well check? The services and screenings done by your health care provider during your annual well check will depend on your age, overall health, lifestyle risk factors, and family history of disease. Counseling  Your health care provider may ask you  questions about your:  Alcohol use.  Tobacco use.  Drug use.  Emotional well-being.  Home and relationship well-being.  Sexual activity.  Eating habits.  History of falls.  Memory and ability to understand (cognition).  Work and work Statistician. Screening  You may have the following tests or measurements:  Height, weight, and BMI.  Blood pressure.  Lipid and cholesterol levels. These may be checked every 5 years, or more frequently if you are over 53 years old.  Skin check.  Lung cancer screening. You may have this screening every year starting at age 40 if you have a 30-pack-year history of smoking and currently smoke or have quit within the past 15 years.  Fecal occult blood test (FOBT) of the stool. You may have this test every year starting at age 50.  Flexible sigmoidoscopy or colonoscopy. You may have a sigmoidoscopy every 5 years or a colonoscopy every 10 years starting at age 41.  Prostate cancer screening. Recommendations will vary depending on your family history and other risks.  Hepatitis C blood test.  Hepatitis B blood test.  Sexually transmitted disease (STD) testing.  Diabetes screening. This is done by checking your blood sugar (glucose) after you have not eaten for a while (fasting). You may have this done every 1-3 years.  Abdominal aortic aneurysm (AAA) screening. You may need this if you are a current or former smoker.  Osteoporosis. You may be screened starting at age 55 if you are at high risk. Talk with your health care provider about your test results, treatment options, and if necessary, the need for more tests. Vaccines  Your health care provider  may recommend certain vaccines, such as:  Influenza vaccine. This is recommended every year.  Tetanus, diphtheria, and acellular pertussis (Tdap, Td) vaccine. You may need a Td booster every 10 years.  Zoster vaccine. You may need this after age 79.  Pneumococcal 13-valent conjugate  (PCV13) vaccine. One dose is recommended after age 24.  Pneumococcal polysaccharide (PPSV23) vaccine. One dose is recommended after age 10. Talk to your health care provider about which screenings and vaccines you need and how often you need them. This information is not intended to replace advice given to you by your health care provider. Make sure you discuss any questions you have with your health care provider. Document Released: 11/06/2015 Document Revised: 06/29/2016 Document Reviewed: 08/11/2015 Elsevier Interactive Patient Education  2017 [employer] Prevention in the Home Falls can cause injuries. They can happen to people of all ages. There are many things you can do to make your home safe and to help prevent falls. What can I do on the outside of my home?  Regularly fix the edges of walkways and driveways and fix any cracks.  Remove anything that might make you trip as you walk through a door, such as a raised step or threshold.  Trim any bushes or trees on the path to your home.  Use bright outdoor lighting.  Clear any walking paths of anything that might make someone trip, such as rocks or tools.  Regularly check to see if handrails are loose or broken. Make sure that both sides of any steps have handrails.  Any raised decks and porches should have guardrails on the edges.  Have any leaves, snow, or ice cleared regularly.  Use sand or salt on walking paths during winter.  Clean up any spills in your garage right away. This includes oil or grease spills. What can I do in the bathroom?  Use night lights.  Install grab bars by the toilet and in the tub and shower. Do not use towel bars as grab bars.  Use non-skid mats or decals in the tub or shower.  If you need to sit down in the shower, use a plastic, non-slip stool.  Keep the floor dry. Clean up any water that spills on the floor as soon as it happens.  Remove soap buildup in the tub or shower  regularly.  Attach bath mats securely with double-sided non-slip rug tape.  Do not have throw rugs and other things on the floor that can make you trip. What can I do in the bedroom?  Use night lights.  Make sure that you have a light by your bed that is easy to reach.  Do not use any sheets or blankets that are too big for your bed. They should not hang down onto the floor.  Have a firm chair that has side arms. You can use this for support while you get dressed.  Do not have throw rugs and other things on the floor that can make you trip. What can I do in the kitchen?  Clean up any spills right away.  Avoid walking on wet floors.  Keep items that you use a lot in easy-to-reach places.  If you need to reach something above you, use a strong step stool that has a grab bar.  Keep electrical cords out of the way.  Do not use floor polish or wax that makes floors slippery. If you must use wax, use non-skid floor wax.  Do not have throw rugs  and other things on the floor that can make you trip. What can I do with my stairs?  Do not leave any items on the stairs.  Make sure that there are handrails on both sides of the stairs and use them. Fix handrails that are broken or loose. Make sure that handrails are as long as the stairways.  Check any carpeting to make sure that it is firmly attached to the stairs. Fix any carpet that is loose or worn.  Avoid having throw rugs at the top or bottom of the stairs. If you do have throw rugs, attach them to the floor with carpet tape.  Make sure that you have a light switch at the top of the stairs and the bottom of the stairs. If you do not have them, ask someone to add them for you. What else can I do to help prevent falls?  Wear shoes that:  Do not have high heels.  Have rubber bottoms.  Are comfortable and fit you well.  Are closed at the toe. Do not wear sandals.  If you use a stepladder:  Make sure that it is fully  opened. Do not climb a closed stepladder.  Make sure that both sides of the stepladder are locked into place.  Ask someone to hold it for you, if possible.  Clearly mark and make sure that you can see:  Any grab bars or handrails.  First and last steps.  Where the edge of each step is.  Use tools that help you move around (mobility aids) if they are needed. These include:  Canes.  Walkers.  Scooters.  Crutches.  Turn on the lights when you go into a dark area. Replace any light bulbs as soon as they burn out.  Set up your furniture so you have a clear path. Avoid moving your furniture around.  If any of your floors are uneven, fix them.  If there are any pets around you, be aware of where they are.  Review your medicines with your doctor. Some medicines can make you feel dizzy. This can increase your chance of falling. Ask your doctor what other things that you can do to help prevent falls. This information is not intended to replace advice given to you by your health care provider. Make sure you discuss any questions you have with your health care provider. Document Released: 08/06/2009 Document Revised: 03/17/2016 Document Reviewed: 11/14/2014 Elsevier Interactive Patient Education  2017 [employer].

## 2019-05-03 ENCOUNTER — Other Ambulatory Visit: Payer: Self-pay

## 2019-05-03 ENCOUNTER — Ambulatory Visit (INDEPENDENT_AMBULATORY_CARE_PROVIDER_SITE_OTHER): Payer: PPO | Admitting: Family Medicine

## 2019-05-03 ENCOUNTER — Encounter: Payer: Self-pay | Admitting: Family Medicine

## 2019-05-03 VITALS — BP 126/70 | HR 56 | Temp 97.9°F | Resp 16 | Ht 64.0 in | Wt 125.0 lb

## 2019-05-03 DIAGNOSIS — E538 Deficiency of other specified B group vitamins: Secondary | ICD-10-CM

## 2019-05-03 DIAGNOSIS — I10 Essential (primary) hypertension: Secondary | ICD-10-CM | POA: Diagnosis not present

## 2019-05-03 DIAGNOSIS — M65342 Trigger finger, left ring finger: Secondary | ICD-10-CM

## 2019-05-03 DIAGNOSIS — G2581 Restless legs syndrome: Secondary | ICD-10-CM | POA: Diagnosis not present

## 2019-05-03 DIAGNOSIS — M65351 Trigger finger, right little finger: Secondary | ICD-10-CM | POA: Diagnosis not present

## 2019-05-03 DIAGNOSIS — M65312 Trigger thumb, left thumb: Secondary | ICD-10-CM

## 2019-05-03 DIAGNOSIS — Z862 Personal history of diseases of the blood and blood-forming organs and certain disorders involving the immune mechanism: Secondary | ICD-10-CM | POA: Diagnosis not present

## 2019-05-03 DIAGNOSIS — Z0000 Encounter for general adult medical examination without abnormal findings: Secondary | ICD-10-CM | POA: Diagnosis not present

## 2019-05-03 DIAGNOSIS — E119 Type 2 diabetes mellitus without complications: Secondary | ICD-10-CM

## 2019-05-03 DIAGNOSIS — Z23 Encounter for immunization: Secondary | ICD-10-CM | POA: Diagnosis not present

## 2019-05-03 DIAGNOSIS — M65321 Trigger finger, right index finger: Secondary | ICD-10-CM

## 2019-05-03 DIAGNOSIS — M65322 Trigger finger, left index finger: Secondary | ICD-10-CM

## 2019-05-03 DIAGNOSIS — M65331 Trigger finger, right middle finger: Secondary | ICD-10-CM

## 2019-05-03 DIAGNOSIS — M65311 Trigger thumb, right thumb: Secondary | ICD-10-CM | POA: Diagnosis not present

## 2019-05-03 DIAGNOSIS — Z125 Encounter for screening for malignant neoplasm of prostate: Secondary | ICD-10-CM

## 2019-05-03 DIAGNOSIS — M65341 Trigger finger, right ring finger: Secondary | ICD-10-CM | POA: Diagnosis not present

## 2019-05-03 DIAGNOSIS — M65332 Trigger finger, left middle finger: Secondary | ICD-10-CM

## 2019-05-03 DIAGNOSIS — M65352 Trigger finger, left little finger: Secondary | ICD-10-CM

## 2019-05-03 MED ORDER — ROPINIROLE HCL 2 MG PO TABS: ORAL_TABLET | ORAL | 0 refills | Status: DC

## 2019-05-03 NOTE — Patient Instructions (Addendum)
.   Please review the attached list of medications and notify my office if there are any errors.   . Please bring all of your medications to every appointment so we can make sure that our medication list is the same as yours.   . We will have flu vaccines available after Labor Day. Please go to your pharmacy or call the office in early September to schedule you flu shot.   The CDC recommends two doses of Shingrix (the shingles vaccine) separated by 2 to 6 months for adults age 12 years and older. I recommend checking with your insurance plan regarding coverage for this vaccine.    Try OTC Voltaren Gel for pains and swelling in your hands and fingers

## 2019-05-03 NOTE — Progress Notes (Signed)
7      Patient: Jeremiah Meyer, Male    DOB: [birth year], 67 y.o.   MRN: [id number] Visit Date: 05/03/2019  Today's Provider: Lelon Huh, MD   Chief Complaint  Patient presents with  . Annual Exam   Subjective:     Complete Physical Jeremiah Meyer is a 68 y.o. male. He feels fairly well. He reports exercising some. He reports he is sleeping poorly.  Pt states mirapex is not working as well as it has.    ----------------------------------------------------------- He also reports that restless legs have been worse and that it has been keeping him up at night. He would like to switch back to ropinirole which worked well for a few years before losing effectiveness and changing to Mirapex.   He is also due for diabetes follow up, his last hgba1c was 7.6 on 10/30/2018 doing well with meds with no adverse effects.   Review of Systems  Constitutional: Negative.   HENT: Negative.   Eyes: Negative.   Respiratory: Negative.   Cardiovascular: Negative.   Gastrointestinal: Negative.   Endocrine: Negative.   Genitourinary: Negative.   Musculoskeletal: Negative.   Skin: Negative.   Allergic/Immunologic: Negative.   Neurological: Negative.   Hematological: Negative.   Psychiatric/Behavioral: Negative.     Social History   Socioeconomic History  . Marital status: Married    Spouse name: Raiford Noble  . Number of children: 1  . Years of education: Not on file  . Highest education level: Associate degree: occupational, Hotel manager, or vocational program  Occupational History  . 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 Financial resource strain: Not hard at all  . Food insecurity    Worry: Never true    Inability: Never true  . Transportation needs    Medical: No    Non-medical: No  Tobacco Use  . Smoking status: Former Smoker    Types: Cigarettes    Quit date: 10/24/1984    Years since quitting: 34.5  . Smokeless tobacco: Never  Used  . Tobacco comment: Quit 1986  Substance and Sexual Activity  . Alcohol use: Yes    Comment: beer 1-2 monthly  . Drug use: Yes    Types: Marijuana  . Sexual activity: Not on file  Lifestyle  . Physical activity    Days per week: 0 days    Minutes per session: 0 min  . Stress: Not at all  Relationships  . Social Herbalist on phone: Patient refused    Gets together: Patient refused    Attends religious service: Patient refused    Active member of club or organization: Patient refused    Attends meetings of clubs or organizations: Patient refused    Relationship status: Patient refused  . Intimate partner violence    Fear of current or ex partner: Patient refused    Emotionally abused: Patient refused    Physically abused: Patient refused    Forced sexual activity: Patient refused  Other Topics Concern  . Not on file  Social History Narrative   Has one son who lives in [place]    Past Medical History:  Diagnosis Date  . Anemia   . Cyst, pilonidal, with abscess 06/12/2015  . Diabetes mellitus without complication ([place])    type 2  . Hemorrhoid 06/12/2015  . Hip strain   . Hyperlipidemia   . Hypertension      Patient Active Problem List   Diagnosis  Date Noted  . History of pernicious anemia   . Allergic rhinitis 06/22/2015  . Gonalgia 06/12/2015  . Psoriasis 06/12/2015  . B12 deficiency 06/01/2015  . ED (erectile dysfunction) of organic origin 03/27/2009  . Cervical pain 05/16/2007  . Enthesopathy 05/16/2007  . Essential familial hypercholesterolemia 06/14/2006  . Essential (primary) hypertension 02/13/2006  . Restless leg 05/30/2002  . Controlled diabetes mellitus type II without complication ([place]) 25/36/6440    Past Surgical History:  Procedure Laterality Date  . LUNG SURGERY Left 1985   S/P [place]    His family history includes Cancer in his brother and father; Diabetes in his brother.   Current  Outpatient Medications:  .  aspirin 81 MG tablet, Take 81 mg by mouth daily., Disp: , Rfl:  .  betamethasone dipropionate (DIPROLENE) 0.05 % cream, Apply topically daily. (Patient taking differently: Apply topically daily. ), Disp: 45 g, Rfl: 1 .  Blood Glucose Monitoring Suppl (ONE TOUCH ULTRA 2) w/Device KIT, Use to check blood sugar daily for type 2 diabetes, Disp: 1 each, Rfl: 1 .  cyanocobalamin (,VITAMIN B-12,) 1000 MCG/ML injection, INJECT 1 MLS MONTHLY AS DIRECTED, Disp: 3 mL, Rfl: 4 .  fluticasone (FLONASE) 50 MCG/ACT nasal spray, Place 2 sprays into both nostrils daily. (Patient taking differently: Place 2 sprays into both nostrils daily. ), Disp: 16 g, Rfl: 3 .  glipiZIDE (GLUCOTROL XL) 2.5 MG 24 hr tablet, TAKE 1 TABLET (2.5 MG TOTAL) BY MOUTH DAILY WITH BREAKFAST., Disp: 90 tablet, Rfl: 4 .  glucose blood (ONE TOUCH ULTRA TEST) test strip, USE 1 STRIP TO CHECK BLOOD SUGAR ONCE DAILY, Disp: 100 each, Rfl: 4 .  JANUVIA 100 MG tablet, TAKE 1 TABLET BY MOUTH EVERY DAY, Disp: 90 tablet, Rfl: 4 .  lisinopril-hydrochlorothiazide (PRINZIDE,ZESTORETIC) 10-12.5 MG tablet, Take 1 tablet by mouth daily., Disp: 90 tablet, Rfl: 3 .  meloxicam (MOBIC) 15 MG tablet, Take 1 tablet (15 mg total) by mouth daily as needed., Disp: 90 tablet, Rfl: 1 .  metFORMIN (GLUCOPHAGE) 1000 MG tablet, TAKE 1 TABLET BY MOUTH TWICE A DAY, Disp: 180 tablet, Rfl: 4 .  Multiple Vitamin tablet, Take 1 tablet by mouth daily., Disp: , Rfl:  .  pramipexole (MIRAPEX) 0.5 MG tablet, Take 1 tablet (0.5 mg total) by mouth at bedtime., Disp: 90 tablet, Rfl: 3 .  simvastatin (ZOCOR) 20 MG tablet, Take 1 tablet (20 mg total) by mouth daily., Disp: 90 tablet, Rfl: 4 .  citalopram (CELEXA) 20 MG tablet, TAKE 1 TABLET BY MOUTH EVERY DAY (Patient not taking: Reported on 05/02/2019), Disp: 90 tablet, Rfl: 2  Patient Care Team: Birdie Sons, MD as PCP - General (Family Medicine) Anell Barr, OD as Consulting Physician (Optometry)      Objective:    Vitals: BP 126/70 (BP Location: Right Arm, Patient Position: Sitting, Cuff Size: Normal)   Pulse (!) 56   Temp 97.9 F (36.6 C) (Oral)   Resp 16   Ht '5\' 4"'  (1.626 m)   Wt 125 lb (56.7 kg)   BMI 21.46 kg/m   Physical Exam   General Appearance:    Alert, cooperative, no distress, appears stated age  Head:    Normocephalic, without obvious abnormality, atraumatic  Eyes:    PERRL, conjunctiva/corneas clear, EOM's intact, fundi    benign, both eyes       Ears:    Normal TM's and external ear canals, both ears  Nose:   Nares normal,  septum midline, mucosa normal, no drainage   or sinus tenderness  Throat:   Lips, mucosa, and tongue normal; teeth and gums normal  Neck:   Supple, symmetrical, trachea midline, no adenopathy;       thyroid:  No enlargement/tenderness/nodules; no carotid   bruit or JVD  Back:     Symmetric, no curvature, ROM normal, no CVA tenderness  Lungs:     Clear to auscultation bilaterally, respirations unlabored  Chest wall:    No tenderness or deformity  Heart:    Regular rate and rhythm, S1 and S2 normal, no murmur, rub   or gallop  Abdomen:     Soft, non-tender, bowel sounds active all four quadrants,    no masses, no organomegaly  Genitalia:    deferred  Rectal:    deferred  Extremities:   Extremities normal, atraumatic, no cyanosis or edema  Pulses:   2+ and symmetric all extremities  Skin:   Skin color, texture, turgor normal, no rashes or lesions  Lymph nodes:   Cervical, supraclavicular, and axillary nodes normal  Neurologic:   CNII-XII intact. Normal strength, sensation and reflexes      throughout   Activities of Daily Living In your present state of health, do you have any difficulty performing the following activities: 05/02/2019  Hearing? N  Vision? N  Difficulty concentrating or making decisions? N  Walking or climbing stairs? Y  Comment Due to knee pains.  Dressing or bathing? N  Doing errands, shopping? N  Preparing Food  and eating ? N  Using the Toilet? N  In the past six months, have you accidently leaked urine? N  Do you have problems with loss of bowel control? N  Managing your Medications? N  Managing your Finances? N  Housekeeping or managing your Housekeeping? N  Some recent data might be hidden    Fall Risk Assessment Fall Risk  05/02/2019 04/18/2018 02/01/2017 01/27/2016 09/04/2015  Falls in the past year? 0 No No No No     Depression Screen PHQ 2/9 Scores 05/02/2019 04/18/2018 05/22/2017 02/01/2017  PHQ - 2 Score '1 1 2 1  ' PHQ- 9 Score - - - 1    6CIT Screen 02/01/2017  What Year? 0 points  What month? 0 points  What time? 0 points  Count back from 20 0 points  Months in reverse 0 points  Repeat phrase 4 points  Total Score 4       Assessment & Plan:    Annual Physical Reviewed patient's Family Medical History Reviewed and updated list of patient's medical providers Assessment of cognitive impairment was done Assessed patient's functional ability Established a written schedule for health screening [place] Completed and Reviewed  Exercise Activities and Dietary recommendations Goals    . DIET - INCREASE WATER INTAKE     Recommend increasing water intake to 6-8 glasses a day.     . Exercise 150 minutes per week (moderate activity)    . Increase water intake     Recommend increasing water intake to 4 glasses a day.       Immunization History  Administered Date(s) Administered  . Influenza, High Dose Seasonal PF 06/19/2017, 08/14/2018  . Influenza,inj,Quad PF,6+ Mos 08/25/2014, 09/04/2015  . Pneumococcal Conjugate-13 01/27/2016  . Pneumococcal Polysaccharide-23 09/09/2013  . Td 10/24/1997  . Tdap 12/06/2018    Health Maintenance  Topic Date Due  . FOOT EXAM  02/01/2018  . PNA vac Low Risk Adult (2 of 2 -  PPSV23) 09/09/2018  . OPHTHALMOLOGY EXAM  12/20/2018  . HEMOGLOBIN A1C  04/30/2019  . INFLUENZA VACCINE  05/25/2019  . COLONOSCOPY  08/25/2020   . TETANUS/TDAP  12/06/2028  . Hepatitis C Screening  Completed     Discussed health benefits of physical activity, and encouraged him to engage in regular exercise appropriate for his age and condition.    ------------------------------------------------------------------------------------------------------------  1. Annual physical exam   2. Prostate cancer screening  - PSA  3. Essential (primary) hypertension Well controlled.  Continue current medications.   - CBC  4. Controlled type 2 diabetes mellitus without complication, without long-term current use of insulin (HCC)  - Comprehensive metabolic panel - Hemoglobin A1c - CBC  5. Need for pneumococcal vaccination  - Pneumococcal polysaccharide vaccine 23-valent greater than or equal to 2yo subcutaneous/IM   6. Restless leg  - rOPINIRole (REQUIP) 2 MG tablet; Start 1/2 tablet at bedtime for a week then increase to 1 tablet at bedtime  Dispense: 3 tablet; Refill: 0 - Ferritin  7. Trigger finger of all digits of both hands Has had trigger finger release in the past, try OTC Voltaren cream, consider ortho referral.    The entirety of the information documented in the History of Present Illness, Review of Systems and Physical Exam were personally obtained by me. Portions of this information were initially documented by Ashley Royalty, CMA and reviewed by me for thoroughness and accuracy.    Lelon Huh, MD  [facility]

## 2019-05-04 ENCOUNTER — Encounter: Payer: Self-pay | Admitting: Family Medicine

## 2019-05-04 ENCOUNTER — Other Ambulatory Visit: Payer: Self-pay | Admitting: Family Medicine

## 2019-05-04 DIAGNOSIS — E611 Iron deficiency: Secondary | ICD-10-CM | POA: Insufficient documentation

## 2019-05-04 DIAGNOSIS — G2581 Restless legs syndrome: Secondary | ICD-10-CM

## 2019-05-04 LAB — CBC
Hematocrit: 38.9 % (ref 37.5–51.0)
Hemoglobin: 13.4 g/dL (ref 13.0–17.7)
MCH: 30.2 pg (ref 26.6–33.0)
MCHC: 34.4 g/dL (ref 31.5–35.7)
MCV: 88 fL (ref 79–97)
Platelets: 240 10*3/uL (ref 150–450)
RBC: 4.44 x10E6/uL (ref 4.14–5.80)
RDW: 12.4 % (ref 11.6–15.4)
WBC: 5.2 10*3/uL (ref 3.4–10.8)

## 2019-05-04 LAB — COMPREHENSIVE METABOLIC PANEL
ALT: 26 IU/L (ref 0–44)
AST: 27 IU/L (ref 0–40)
Albumin/Globulin Ratio: 2.3 — ABNORMAL HIGH (ref 1.2–2.2)
Albumin: 4.3 g/dL (ref 3.8–4.8)
Alkaline Phosphatase: 62 IU/L (ref 39–117)
BUN/Creatinine Ratio: 16 (ref 10–24)
BUN: 15 mg/dL (ref 8–27)
Bilirubin Total: 0.5 mg/dL (ref 0.0–1.2)
CO2: 24 mmol/L (ref 20–29)
Calcium: 9.3 mg/dL (ref 8.6–10.2)
Chloride: 103 mmol/L (ref 96–106)
Creatinine, Ser: 0.92 mg/dL (ref 0.76–1.27)
GFR calc Af Amer: 98 mL/min/{1.73_m2} (ref >59)
GFR calc non Af Amer: 85 mL/min/{1.73_m2} (ref >59)
Globulin, Total: 1.9 g/dL (ref 1.5–4.5)
Glucose: 64 mg/dL — ABNORMAL LOW (ref 65–99)
Potassium: 4.5 mmol/L (ref 3.5–5.2)
Sodium: 142 mmol/L (ref 134–144)
Total Protein: 6.2 g/dL (ref 6.0–8.5)

## 2019-05-04 LAB — HEMOGLOBIN A1C
Est. average glucose Bld gHb Est-mCnc: 131 mg/dL
Hgb A1c MFr Bld: 6.2 % — ABNORMAL HIGH (ref 4.8–5.6)

## 2019-05-04 LAB — FERRITIN: Ferritin: 28 ng/mL — ABNORMAL LOW (ref 30–400)

## 2019-05-04 LAB — PSA: Prostate Specific Ag, Serum: 1.6 ng/mL (ref 0.0–4.0)

## 2019-05-05 ENCOUNTER — Other Ambulatory Visit: Payer: Self-pay | Admitting: Family Medicine

## 2019-05-05 DIAGNOSIS — G2581 Restless legs syndrome: Secondary | ICD-10-CM

## 2019-05-06 ENCOUNTER — Telehealth: Payer: Self-pay

## 2019-05-06 NOTE — Telephone Encounter (Signed)
-----   Message from Birdie Sons, MD sent at 05/04/2019  9:02 AM EDT ----- Iron levels are low which makes restless legs worse. Needs to take OTC ferrous sulfate (iron supplement) 325mg  once a day. PSA is normal. a1c is good at 6.2. Continue current medications.  Follow up for diabetes and restless legs 4-5 months.

## 2019-05-06 NOTE — Telephone Encounter (Signed)
Patient advised and 4 month follow up was scheduled. KW

## 2019-05-06 NOTE — Telephone Encounter (Signed)
Pt returned call ° °teri °

## 2019-05-06 NOTE — Telephone Encounter (Signed)
[id number] 

## 2019-05-20 ENCOUNTER — Telehealth: Payer: Self-pay | Admitting: Family Medicine

## 2019-05-20 NOTE — Telephone Encounter (Signed)
Pt stated that he stopped taking glipiZIDE (GLUCOTROL XL) 2.5 MG 24 hr tablet 3 days ago because he thinks that is what is causing the issue with his legs at night and was keeping him from sleeping. Pt stated since stopping the medication his has been sleeping well and his legs aren't bothering him. Pt stated that his fasting blood sugar since stopping the medication has been about 160 but during the day after he takes his other medications it goes down to 110 or 115. Pt stated he just wanted to let us know that he had stopped the medication and doesn't want to restart it. Pt also, is asking how long he should continue taking the OTC iron supplement and when he should have his iron rechecked. Pt stated that he is taking slow FE which equals 142 mg ferrous sulfate. Pt is asking if that is enough or should he take 2 of the slow FE because the bottle directions are to take 1 a day. Please advise. Thanks TNP

## 2019-05-20 NOTE — Telephone Encounter (Signed)
Patient advised and verbally voiced understanding.  

## 2019-05-20 NOTE — Telephone Encounter (Signed)
Has last a1c was good, so I think its fine to stay off of glipizide for awhile. We should recheck his iron levels at his follow up in November. Is usually takes a few months for iron levels to build up, and taking higher daily dose of iron only causes more side effects, but will not bring levels up any faster. One dose of slow FE is all he should take.  Let me know if he starts seeing sugars over 200 before next appointment.

## 2019-08-30 NOTE — Progress Notes (Signed)
Patient: Jeremiah Meyer Male    DOB: April 29, 1951   68 y.o.   MRN: [id number] Visit Date: 09/02/2019  Today's Provider: Lelon Huh, MD   Chief Complaint  Patient presents with  . Diabetes  . restless leg   Subjective:     HPI  Diabetes Mellitus Type II, Follow-up:  Lab Results  Component Value Date   HGBA1C 6.2 (H) 05/03/2019   Last seen for diabetes 4 months ago.  Management since then includes no changes. He reports compliance with treatment, but did stop taking glipizide since last visit because he felt it was causing difficulty sleeping.   Current symptoms include none Home blood sugar records: fluctuating   Episodes of hypoglycemia? no              Current Insulin Regimen: None Most Recent Eye Exam: UTD Weight trend: stable  Current diet: in general, a balanced diet Current exercise: yard work   ------------------------------------------------------------------------ RLS:  Patient presents for a 4 month follow up. Last OV was on 05/03/2019. Patient advised to increase Requip 2 mg to 1 tablet at bedtime. Patient reports good compliance with treatment plan. He states symptoms are improved. Patient states he may switch back to Maripex. He feels like Mirapex worked better than ropinirole when he took it previously and feels like the improvement he has experienced over the last several months is due to starting Iron supplement.    No Known Allergies   Current Outpatient Medications:  .  aspirin 81 MG tablet, Take 81 mg by mouth daily., Disp: , Rfl:  .  betamethasone dipropionate (DIPROLENE) 0.05 % cream, Apply topically daily. (Patient taking differently: Apply topically daily. ), Disp: 45 g, Rfl: 1 .  Blood Glucose Monitoring Suppl (ONE TOUCH ULTRA 2) w/Device KIT, Use to check blood sugar daily for type 2 diabetes, Disp: 1 each, Rfl: 1 .  cyanocobalamin (,VITAMIN B-12,) 1000 MCG/ML injection, INJECT 1 MLS MONTHLY AS DIRECTED, Disp: 3 mL, Rfl: 4 .   fluticasone (FLONASE) 50 MCG/ACT nasal spray, Place 2 sprays into both nostrils daily. (Patient taking differently: Place 2 sprays into both nostrils daily. ), Disp: 16 g, Rfl: 3 .  glipiZIDE (GLUCOTROL XL) 2.5 MG 24 hr tablet, TAKE 1 TABLET (2.5 MG TOTAL) BY MOUTH DAILY WITH BREAKFAST., Disp: 90 tablet, Rfl: 4 (STOPPED AFTER LAST OFFICE VISIT) .  glucose blood (ONE TOUCH ULTRA TEST) test strip, USE 1 STRIP TO CHECK BLOOD SUGAR ONCE DAILY, Disp: 100 each, Rfl: 4 .  JANUVIA 100 MG tablet, TAKE 1 TABLET BY MOUTH EVERY DAY, Disp: 90 tablet, Rfl: 4 .  lisinopril-hydrochlorothiazide (PRINZIDE,ZESTORETIC) 10-12.5 MG tablet, Take 1 tablet by mouth daily., Disp: 90 tablet, Rfl: 3 .  metFORMIN (GLUCOPHAGE) 1000 MG tablet, TAKE 1 TABLET BY MOUTH TWICE A DAY, Disp: 180 tablet, Rfl: 4 .  Multiple Vitamin tablet, Take 1 tablet by mouth daily., Disp: , Rfl:  .  rOPINIRole (REQUIP) 2 MG tablet, Start 1/2 tablet at bedtime for a week then increase to 1 tablet at bedtime, Disp: 90 tablet, Rfl: 2 .  simvastatin (ZOCOR) 20 MG tablet, Take 1 tablet (20 mg total) by mouth daily., Disp: 90 tablet, Rfl: 4 .  citalopram (CELEXA) 20 MG tablet, TAKE 1 TABLET BY MOUTH EVERY DAY (Patient not taking: Reported on 05/02/2019), Disp: 90 tablet, Rfl: 2 .  meloxicam (MOBIC) 15 MG tablet, Take 1 tablet (15 mg total) by mouth daily as needed. (Patient not taking: Reported on 09/02/2019), Disp:  90 tablet, Rfl: 1  Review of Systems  Constitutional: Negative for appetite change, chills and fever.  Respiratory: Negative for chest tightness, shortness of breath and wheezing.   Cardiovascular: Negative for chest pain and palpitations.  Gastrointestinal: Negative for abdominal pain, nausea and vomiting.    Social History   Tobacco Use  . Smoking status: Former Smoker    Types: Cigarettes    Quit date: 10/24/1984    Years since quitting: 34.8  . Smokeless tobacco: Never Used  . Tobacco comment: Quit 1986  Substance Use Topics  .  Alcohol use: Yes    Comment: beer 1-2 monthly      Objective:   BP 127/66 (BP Location: Right Arm, Patient Position: Sitting, Cuff Size: Normal)   Pulse 65   Temp (!) 96.6 F (35.9 C) (Temporal)   Wt 124 lb 6.4 oz (56.4 kg)   SpO2 98%   BMI 21.35 kg/m  Vitals:   09/02/19 0822  BP: 127/66  Pulse: 65  Temp: (!) 96.6 F (35.9 C)  TempSrc: Temporal  SpO2: 98%  Weight: 124 lb 6.4 oz (56.4 kg)  Body mass index is 21.35 kg/m.   Physical Exam  General appearance: Well developed, well nourished male, cooperative and in no acute distress Head: Normocephalic, without obvious abnormality, atraumatic Respiratory: Respirations even and unlabored, normal respiratory rate Extremities: All extremities are intact.  Skin: Skin color, texture, turgor normal. No rashes seen  Psych: Appropriate mood and affect. Neurologic: Mental status: Alert, oriented to person, place, and time, thought content appropriate.  Results for orders placed or performed in visit on 09/02/19  POCT HgB A1C  Result Value Ref Range   Hemoglobin A1C 6.3 (A) 4.0 - 5.6 %   Est. average glucose Bld gHb Est-mCnc 134        Assessment & Plan    1. Controlled type 2 diabetes mellitus without complication, without long-term current use of insulin (HCC) Well controlled is off of glipizide. He has not yet had eye exam this year due to Covid  2. Restless leg Improved since treating iron deficiency. He felt pramipexole taken in the past was more effective then ropinirole. Change to - pramipexole (MIRAPEX) 0.5 MG tablet; Take 1 tablet (0.5 mg total) by mouth at bedtime.  3. Iron deficiency Continue - ferrous sulfate 325 (65 FE) MG tablet; Take 325 mg by mouth daily.     Lelon Huh, MD  [facility]

## 2019-09-02 ENCOUNTER — Other Ambulatory Visit: Payer: Self-pay

## 2019-09-02 ENCOUNTER — Ambulatory Visit (INDEPENDENT_AMBULATORY_CARE_PROVIDER_SITE_OTHER): Payer: PPO | Admitting: Family Medicine

## 2019-09-02 ENCOUNTER — Encounter: Payer: Self-pay | Admitting: Family Medicine

## 2019-09-02 VITALS — BP 127/66 | HR 65 | Temp 96.6°F | Wt 124.4 lb

## 2019-09-02 DIAGNOSIS — G2581 Restless legs syndrome: Secondary | ICD-10-CM

## 2019-09-02 DIAGNOSIS — E119 Type 2 diabetes mellitus without complications: Secondary | ICD-10-CM | POA: Diagnosis not present

## 2019-09-02 DIAGNOSIS — E611 Iron deficiency: Secondary | ICD-10-CM | POA: Diagnosis not present

## 2019-09-02 LAB — POCT GLYCOSYLATED HEMOGLOBIN (HGB A1C)
Est. average glucose Bld gHb Est-mCnc: 134
Hemoglobin A1C: 6.3 % — AB (ref 4.0–5.6)

## 2019-09-02 MED ORDER — PRAMIPEXOLE DIHYDROCHLORIDE 0.5 MG PO TABS: 0.5000 mg | ORAL_TABLET | Freq: Every day | ORAL | Status: DC

## 2019-09-02 NOTE — Patient Instructions (Addendum)
.   Please review the attached list of medications and notify my office if there are any errors.   . Please bring all of your medications to every appointment so we can make sure that our medication list is the same as yours.   . Please contact your eyecare professional to schedule a routine eye exam    

## 2019-09-04 ENCOUNTER — Other Ambulatory Visit: Payer: Self-pay | Admitting: Family Medicine

## 2019-09-04 DIAGNOSIS — G2581 Restless legs syndrome: Secondary | ICD-10-CM

## 2019-09-16 ENCOUNTER — Telehealth: Payer: Self-pay

## 2019-09-16 NOTE — Telephone Encounter (Signed)
I didn't call patient. No open notes or recent labs that document someone calling patient.

## 2019-09-16 NOTE — Telephone Encounter (Signed)
Please review.  Did you try to call the pt?  Thanks,   Mickel Baas   Copied from [place] [phone]. Topic: General - Call Back - No Documentation >> Sep 16, 2019  1:19 PM Erick Blinks wrote: Reason for CRM: Pt called requesting call back, he missed a call from the office  Best contact: [phone]

## 2019-10-09 ENCOUNTER — Other Ambulatory Visit: Payer: Self-pay | Admitting: Family Medicine

## 2019-10-09 DIAGNOSIS — E119 Type 2 diabetes mellitus without complications: Secondary | ICD-10-CM

## 2019-10-15 ENCOUNTER — Other Ambulatory Visit: Payer: Self-pay | Admitting: Family Medicine

## 2019-10-15 DIAGNOSIS — E538 Deficiency of other specified B group vitamins: Secondary | ICD-10-CM

## 2019-10-15 NOTE — Telephone Encounter (Signed)
Requested medication (s) are due for refill today: yes  Requested medication (s) are on the active medication list:  yes  Last refill:  06/26/2019  Future visit scheduled: yes  Notes to clinic:  Medication not assigned to a protocol, review manually   Requested Prescriptions  Pending Prescriptions Disp Refills   cyanocobalamin (,VITAMIN B-12,) 1000 MCG/ML injection [Pharmacy Med Name: CYANOCOBALAMIN 1,000 MCG/ML] 3 mL 4    Sig: INJECT 1 MLS MONTHLY AS DIRECTED      Off-Protocol Failed - 10/15/2019  1:51 AM      Failed - Medication not assigned to a protocol, review manually.      Passed - Valid encounter within last 12 months    Recent Outpatient Visits           1 month ago Controlled type 2 diabetes mellitus without complication, without long-term current use of insulin [facility])   [facility] Birdie Sons, MD   5 months ago Annual physical exam   [facility] Birdie Sons, MD   11 months ago Essential (primary) hypertension   [facility] Birdie Sons, MD   1 year ago Annual physical exam   [facility] Birdie Sons, MD   1 year ago Hip strain, right, initial encounter   [place], [place]       Future Appointments             In 2 months Caryn Section, Kirstie Peri, MD [facility]           Off-Protocol Failed - 10/15/2019  1:51 AM      Failed - Medication not assigned to a protocol, review manually.      Passed - Valid encounter within last 12 months    Recent Outpatient Visits           1 month ago Controlled type 2 diabetes mellitus without complication, without long-term current use of insulin [facility])   [facility] Birdie Sons, MD   5 months ago Annual physical exam   [facility] Birdie Sons, MD   11 months ago Essential (primary) hypertension   [facility] Birdie Sons, MD   1 year ago  Annual physical exam   [facility] Birdie Sons, MD   1 year ago Hip strain, right, initial encounter   [place], [place]       Future Appointments             In 2 months Fisher, Kirstie Peri, MD [facility], [place]

## 2019-12-30 ENCOUNTER — Encounter: Payer: Self-pay | Admitting: Family Medicine

## 2019-12-30 ENCOUNTER — Other Ambulatory Visit: Payer: Self-pay

## 2019-12-30 ENCOUNTER — Ambulatory Visit (INDEPENDENT_AMBULATORY_CARE_PROVIDER_SITE_OTHER): Payer: PPO | Admitting: Family Medicine

## 2019-12-30 VITALS — BP 128/76 | HR 64 | Temp 97.3°F | Resp 16 | Wt 133.0 lb

## 2019-12-30 DIAGNOSIS — E611 Iron deficiency: Secondary | ICD-10-CM | POA: Diagnosis not present

## 2019-12-30 DIAGNOSIS — E119 Type 2 diabetes mellitus without complications: Secondary | ICD-10-CM | POA: Diagnosis not present

## 2019-12-30 DIAGNOSIS — E538 Deficiency of other specified B group vitamins: Secondary | ICD-10-CM

## 2019-12-30 DIAGNOSIS — I10 Essential (primary) hypertension: Secondary | ICD-10-CM | POA: Diagnosis not present

## 2019-12-30 DIAGNOSIS — E7801 Familial hypercholesterolemia: Secondary | ICD-10-CM | POA: Diagnosis not present

## 2019-12-30 LAB — POCT GLYCOSYLATED HEMOGLOBIN (HGB A1C)
Est. average glucose Bld gHb Est-mCnc: 157
Hemoglobin A1C: 7.1 % — AB (ref 4.0–5.6)

## 2019-12-30 MED ORDER — GLIPIZIDE ER 2.5 MG PO TB24: 2.5000 mg | ORAL_TABLET | Freq: Every day | ORAL | Status: DC

## 2019-12-30 NOTE — Progress Notes (Signed)
Patient: DETAVIOUS Meyer Male    DOB: Apr 17, 1951   69 y.o.   MRN: [id number] Visit Date: 12/30/2019  Today's Provider: Lelon Huh, MD   Chief Complaint  Patient presents with  . Diabetes   Subjective:     HPI  Diabetes Mellitus Type II, Follow-up:   Lab Results  Component Value Date   HGBA1C 7.1 (A) 12/30/2019   HGBA1C 6.3 (A) 09/02/2019   HGBA1C 6.2 (H) 05/03/2019    Last seen for diabetes 4 months ago.  Management since then includes no changes. He reports good compliance with treatment. Patient restarted Glipizide 2.5 months ago since he wasn't able to exercise regularly. He is not having side effects.  Current symptoms include none and have been stable. Home blood sugar records: blood sugars are not checked regularly  Episodes of hypoglycemia? no   Current insulin regiment: Is not on insulin Most Recent Eye Exam: >1 year ago Weight trend: fluctuating a bit Prior visit with dietician: No Current exercise: none Current diet habits: well balanced  States he started back on 2.65m glipizide a few months ago since he wasn't as active, but he started being more active the last week or two and stopped again.  Pertinent Labs:    Component Value Date/Time   CHOL 142 10/30/2018 0855   TRIG 78 10/30/2018 0855   HDL 48 10/30/2018 0855   LDLCALC 78 10/30/2018 0855   LDLCALC 84 10/09/2017 0926   CREATININE 0.92 05/03/2019 1122   CREATININE 1.01 10/09/2017 0926    Wt Readings from Last 3 Encounters:  12/30/19 133 lb (60.3 kg)  09/02/19 124 lb 6.4 oz (56.4 kg)  05/03/19 125 lb (56.7 kg)    ------------------------------------------------------------------------  Follow up for Restless Leg:  The patient was last seen for this 4 months ago. Changes made at last visit include changing to Mirapex.  He reports good compliance with treatment. He feels that condition is Improved. He is not having side effects.    ------------------------------------------------------------------------------------   He states he received his pfizer covid vaccine at the [facility] on 2-29-2021  No Known Allergies   Current Outpatient Medications:  .  aspirin 81 MG tablet, Take 81 mg by mouth daily., Disp: , Rfl:  .  Blood Glucose Monitoring Suppl (ONE TOUCH ULTRA 2) w/Device KIT, Use to check blood sugar daily for type 2 diabetes, Disp: 1 each, Rfl: 1 .  cyanocobalamin (,VITAMIN B-12,) 1000 MCG/ML injection, INJECT 1 MLS MONTHLY AS DIRECTED, Disp: 3 mL, Rfl: 4 .  ferrous sulfate 325 (65 FE) MG tablet, Take 325 mg by mouth daily., Disp: , Rfl:  .  glipiZIDE (GLUCOTROL XL) 2.5 MG 24 hr tablet, Take 2.5 mg by mouth daily with breakfast., Disp: , Rfl:  .  JANUVIA 100 MG tablet, TAKE 1 TABLET BY MOUTH EVERY DAY, Disp: 90 tablet, Rfl: 4 .  lisinopril-hydrochlorothiazide (PRINZIDE,ZESTORETIC) 10-12.5 MG tablet, Take 1 tablet by mouth daily., Disp: 90 tablet, Rfl: 3 .  meloxicam (MOBIC) 15 MG tablet, Take 1 tablet (15 mg total) by mouth daily as needed., Disp: 90 tablet, Rfl: 1 .  metFORMIN (GLUCOPHAGE) 1000 MG tablet, TAKE 1 TABLET BY MOUTH TWICE A DAY, Disp: 180 tablet, Rfl: 4 .  Multiple Vitamin tablet, Take 1 tablet by mouth daily., Disp: , Rfl:  .  ONETOUCH ULTRA test strip, USE 1 STRIP TO CHECK BLOOD SUGAR ONCE DAILY, Disp: 100 strip, Rfl: 4 .  pramipexole (MIRAPEX) 0.5 MG tablet, TAKE 1 TABLET (  0.5 MG TOTAL) BY MOUTH AT BEDTIME., Disp: 90 tablet, Rfl: 3 .  simvastatin (ZOCOR) 20 MG tablet, Take 1 tablet (20 mg total) by mouth daily., Disp: 90 tablet, Rfl: 4 .  betamethasone dipropionate (DIPROLENE) 0.05 % cream, Apply topically daily. (Patient not taking: Reported on 12/30/2019), Disp: 45 g, Rfl: 1  Review of Systems  Constitutional: Negative for appetite change, chills and fever.  Respiratory: Negative for chest tightness, shortness of breath and wheezing.   Cardiovascular: Negative for chest pain and palpitations.   Gastrointestinal: Negative for abdominal pain, nausea and vomiting.    Social History   Tobacco Use  . Smoking status: Former Smoker    Types: Cigarettes    Quit date: 10/24/1984    Years since quitting: 35.2  . Smokeless tobacco: Never Used  . Tobacco comment: Quit 1986  Substance Use Topics  . Alcohol use: Yes    Comment: beer 1-2 monthly      Objective:   BP 128/76 (BP Location: Left Arm, Patient Position: Sitting, Cuff Size: Normal)   Pulse 64   Temp (!) 97.3 F (36.3 C) (Temporal)   Resp 16   Wt 133 lb (60.3 kg)   SpO2 99% Comment: room air  BMI 22.83 kg/m  Vitals:   12/30/19 0816  BP: 128/76  Pulse: 64  Resp: 16  Temp: (!) 97.3 F (36.3 C)  TempSrc: Temporal  SpO2: 99%  Weight: 133 lb (60.3 kg)  Body mass index is 22.83 kg/m.   Physical Exam   General: Appearance:    Well developed, well nourished male in no acute distress  Eyes:    PERRL, conjunctiva/corneas clear, EOM's intact       Lungs:     Clear to auscultation bilaterally, respirations unlabored  Heart:    Normal heart rate. Normal rhythm. No murmurs, rubs, or gallops.   MS:   All extremities are intact.   Neurologic:   Awake, alert, oriented x 3. No apparent focal neurological           defect.        Results for orders placed or performed in visit on 12/30/19  POCT HgB A1C  Result Value Ref Range   Hemoglobin A1C 7.1 (A) 4.0 - 5.6 %   Est. average glucose Bld gHb Est-mCnc 157        Assessment & Plan    1. Controlled type 2 diabetes mellitus without complication, without long-term current use of insulin ([place]) Well controlled.  Continue current medications.  He was back on glipizide for about 3 months, but is not stopping it again since he is more active and sugars are improved.   2. B12 deficiency  - Vitamin B12  3. Essential (primary) hypertension   4. Iron deficiency  - CBC - Ferritin  5. Essential familial hypercholesterolemia Well controlled.  Continue current  medications.   - Comprehensive metabolic panel - Lipid panel  Follow up CPE/diabetes in 4 months.     Lelon Huh, MD  [facility]

## 2019-12-30 NOTE — Patient Instructions (Addendum)
.   Please review the attached list of medications and notify my office if there are any errors.   . Please bring all of your medications to every appointment so we can make sure that our medication list is the same as yours.    The CDC recommends two doses of Shingrix (the shingles vaccine) separated by 2 to 6 months for adults age 69 years and older. I recommend checking with your insurance plan regarding coverage for this vaccine.   . .Please contact your eyecare professional to schedule a routine eye exam    

## 2019-12-31 LAB — COMPREHENSIVE METABOLIC PANEL
ALT: 22 IU/L (ref 0–44)
AST: 24 IU/L (ref 0–40)
Albumin/Globulin Ratio: 2 (ref 1.2–2.2)
Albumin: 4.5 g/dL (ref 3.8–4.8)
Alkaline Phosphatase: 75 IU/L (ref 39–117)
BUN/Creatinine Ratio: 13 (ref 10–24)
BUN: 14 mg/dL (ref 8–27)
Bilirubin Total: 0.7 mg/dL (ref 0.0–1.2)
CO2: 25 mmol/L (ref 20–29)
Calcium: 9.2 mg/dL (ref 8.6–10.2)
Chloride: 101 mmol/L (ref 96–106)
Creatinine, Ser: 1.07 mg/dL (ref 0.76–1.27)
GFR calc Af Amer: 82 mL/min/{1.73_m2} (ref >59)
GFR calc non Af Amer: 71 mL/min/{1.73_m2} (ref >59)
Globulin, Total: 2.2 g/dL (ref 1.5–4.5)
Glucose: 190 mg/dL — ABNORMAL HIGH (ref 65–99)
Potassium: 5.3 mmol/L — ABNORMAL HIGH (ref 3.5–5.2)
Sodium: 137 mmol/L (ref 134–144)
Total Protein: 6.7 g/dL (ref 6.0–8.5)

## 2019-12-31 LAB — LIPID PANEL
Chol/HDL Ratio: 2.7 ratio (ref 0.0–5.0)
Cholesterol, Total: 136 mg/dL (ref 100–199)
HDL: 51 mg/dL (ref >39)
LDL Chol Calc (NIH): 72 mg/dL (ref 0–99)
Triglycerides: 61 mg/dL (ref 0–149)
VLDL Cholesterol Cal: 13 mg/dL (ref 5–40)

## 2019-12-31 LAB — CBC
Hematocrit: 42.1 % (ref 37.5–51.0)
Hemoglobin: 14.6 g/dL (ref 13.0–17.7)
MCH: 31.1 pg (ref 26.6–33.0)
MCHC: 34.7 g/dL (ref 31.5–35.7)
MCV: 90 fL (ref 79–97)
Platelets: 245 10*3/uL (ref 150–450)
RBC: 4.7 x10E6/uL (ref 4.14–5.80)
RDW: 12.5 % (ref 11.6–15.4)
WBC: 6.1 10*3/uL (ref 3.4–10.8)

## 2019-12-31 LAB — VITAMIN B12: Vitamin B-12: 499 pg/mL (ref 232–1245)

## 2019-12-31 LAB — FERRITIN: Ferritin: 113 ng/mL (ref 30–400)

## 2020-01-22 DIAGNOSIS — E113293 Type 2 diabetes mellitus with mild nonproliferative diabetic retinopathy without macular edema, bilateral: Secondary | ICD-10-CM | POA: Diagnosis not present

## 2020-01-22 DIAGNOSIS — H2513 Age-related nuclear cataract, bilateral: Secondary | ICD-10-CM | POA: Diagnosis not present

## 2020-01-22 DIAGNOSIS — H5203 Hypermetropia, bilateral: Secondary | ICD-10-CM | POA: Diagnosis not present

## 2020-01-22 DIAGNOSIS — H25013 Cortical age-related cataract, bilateral: Secondary | ICD-10-CM | POA: Diagnosis not present

## 2020-01-22 LAB — HM DIABETES EYE EXAM

## 2020-01-24 ENCOUNTER — Other Ambulatory Visit: Payer: Self-pay | Admitting: Family Medicine

## 2020-01-24 DIAGNOSIS — E119 Type 2 diabetes mellitus without complications: Secondary | ICD-10-CM

## 2020-01-24 DIAGNOSIS — I10 Essential (primary) hypertension: Secondary | ICD-10-CM

## 2020-03-29 ENCOUNTER — Other Ambulatory Visit: Payer: Self-pay | Admitting: Family Medicine

## 2020-03-29 DIAGNOSIS — E119 Type 2 diabetes mellitus without complications: Secondary | ICD-10-CM

## 2020-03-29 NOTE — Telephone Encounter (Signed)
Requested Prescriptions  Pending Prescriptions Disp Refills  . metFORMIN (GLUCOPHAGE) 1000 MG tablet [Pharmacy Med Name: METFORMIN HCL 1,000 MG TABLET] 180 tablet 4    Sig: TAKE 1 TABLET BY MOUTH TWICE A DAY     Endocrinology:  Diabetes - Biguanides Passed - 03/29/2020  9:39 AM      Passed - Cr in normal range and within 360 days    Creat  Date Value Ref Range Status  10/09/2017 1.01 0.70 - 1.25 mg/dL Final    Comment:    For patients >69 years of age, the reference limit for Creatinine is approximately 13% higher for people identified as African-American. .    Creatinine, Ser  Date Value Ref Range Status  12/30/2019 1.07 0.76 - 1.27 mg/dL Final   Creatinine, POC  Date Value Ref Range Status  08/22/2016 n/a mg/dL Final         Passed - HBA1C is between 0 and 7.9 and within 180 days    Hemoglobin A1C  Date Value Ref Range Status  12/30/2019 7.1 (A) 4.0 - 5.6 % Final   Hgb A1c MFr Bld  Date Value Ref Range Status  05/03/2019 6.2 (H) 4.8 - 5.6 % Final    Comment:             Prediabetes: 5.7 - 6.4          Diabetes: >6.4          Glycemic control for adults with diabetes: <7.0          Passed - eGFR in normal range and within 360 days    GFR calc Af Amer  Date Value Ref Range Status  12/30/2019 82 >59 mL/min/1.73 Final   GFR calc non Af Amer  Date Value Ref Range Status  12/30/2019 71 >59 mL/min/1.73 Final         Passed - Valid encounter within last 6 months    Recent Outpatient Visits          3 months ago Controlled type 2 diabetes mellitus without complication, without long-term current use of insulin ([place])   [facility] Birdie Sons, MD   6 months ago Controlled type 2 diabetes mellitus without complication, without long-term current use of insulin [facility])   [facility] Birdie Sons, MD   11 months ago Annual physical exam   [facility] Birdie Sons, MD   1 year ago Essential (primary) hypertension   [facility] Birdie Sons, MD   1 year ago Annual physical exam   [facility] Birdie Sons, MD

## 2020-04-15 ENCOUNTER — Other Ambulatory Visit: Payer: Self-pay | Admitting: Family Medicine

## 2020-04-15 DIAGNOSIS — E7801 Familial hypercholesterolemia: Secondary | ICD-10-CM

## 2020-04-15 NOTE — Telephone Encounter (Signed)
Requested Prescriptions  Pending Prescriptions Disp Refills   simvastatin (ZOCOR) 20 MG tablet [Pharmacy Med Name: SIMVASTATIN 20 MG TABLET] 90 tablet 3    Sig: TAKE 1 TABLET BY MOUTH EVERY DAY     Cardiovascular:  Antilipid - Statins Failed - 04/15/2020  1:19 AM      Failed - LDL in normal range and within 360 days    LDL Cholesterol (Calc)  Date Value Ref Range Status  10/09/2017 84 mg/dL (calc) Final    Comment:    Reference range: <100 . Desirable range <100 mg/dL for primary prevention;   <70 mg/dL for patients with CHD or diabetic patients  with > or = 2 CHD risk factors. Marland Kitchen LDL-C is now calculated using the Martin-Hopkins  calculation, which is a validated novel method providing  better accuracy than the Friedewald equation in the  estimation of LDL-C.  Horald Pollen et al. Lenox Ahr. 2956;213(08): 2061-2068  (http://education.[employer].com/faq/FAQ164)    LDL Chol Calc (NIH)  Date Value Ref Range Status  12/30/2019 72 0 - 99 mg/dL Final         Passed - Total Cholesterol in normal range and within 360 days    Cholesterol, Total  Date Value Ref Range Status  12/30/2019 136 100 - 199 mg/dL Final         Passed - HDL in normal range and within 360 days    HDL  Date Value Ref Range Status  12/30/2019 51 >39 mg/dL Final         Passed - Triglycerides in normal range and within 360 days    Triglycerides  Date Value Ref Range Status  12/30/2019 61 0 - 149 mg/dL Final         Passed - Patient is not pregnant      Passed - Valid encounter within last 12 months    Recent Outpatient Visits          3 months ago Controlled type 2 diabetes mellitus without complication, without long-term current use of insulin ([facility])   [facility] Malva Limes, MD   7 months ago Controlled type 2 diabetes mellitus without complication, without long-term current use of insulin [facility])   [facility] Malva Limes, MD   11 months ago Annual physical exam    [facility] Malva Limes, MD   1 year ago Essential (primary) hypertension   [facility] Malva Limes, MD   1 year ago Annual physical exam   [facility] Malva Limes, MD      Future Appointments            In 1 month Fisher, Demetrios Isaacs, MD [facility]

## 2020-04-30 NOTE — Progress Notes (Signed)
Subjective:   Jeremiah Meyer is a 69 y.o. male who presents for Medicare Annual/Subsequent preventive examination.  I connected with Jeremiah Meyer today by telephone and verified that I am speaking with the correct person using two identifiers. Location patient: home Location provider: work Persons participating in the virtual visit: patient, provider.   I discussed the limitations, risks, security and privacy concerns of performing an evaluation and management service by telephone and the availability of in person appointments. I also discussed with the patient that there may be a patient responsible charge related to this service. The patient expressed understanding and verbally consented to this telephonic visit.    Interactive audio and video telecommunications were attempted between this provider and patient, however failed, due to patient having technical difficulties OR patient did not have access to video capability.  We continued and completed visit with audio only.   Review of Systems    N/A  Cardiac Risk Factors include: advanced age (>79mn, >>52women);diabetes mellitus;dyslipidemia;male gender;hypertension     Objective:    Today's Vitals   05/11/20 0823  PainSc: 0-No pain   There is no height or weight on file to calculate BMI.  Advanced Directives 05/11/2020 05/02/2019 04/18/2018 02/01/2017 04/04/2016 01/27/2016 12/04/2015  Does Patient Have a Medical Advance Directive? No Yes _0   Type of Advance Directive - [place] - - - - -  Copy of [place]? - No - copy requested - - - - -  Would patient like information on creating a medical advance directive? No - Patient declined - No - Patient declined Yes (ED - Information included in AVS) - - -    Current Medications (verified) Outpatient Encounter Medications as of 05/11/2020  Medication Sig  . aspirin 81 MG tablet Take 81 mg by mouth daily.  .  betamethasone dipropionate (DIPROLENE) 0.05 % cream Apply topically daily. (Patient taking differently: Apply topically daily. As needed for psoriasis flare ups)  . Blood Glucose Monitoring Suppl (ONE TOUCH ULTRA 2) w/Device KIT Use to check blood sugar daily for type 2 diabetes  . cyanocobalamin (,VITAMIN B-12,) 1000 MCG/ML injection INJECT 1 MLS MONTHLY AS DIRECTED  . JANUVIA 100 MG tablet TAKE 1 TABLET BY MOUTH EVERY DAY  . lisinopril-hydrochlorothiazide (ZESTORETIC) 10-12.5 MG tablet TAKE 1 TABLET BY MOUTH EVERY DAY  . meloxicam (MOBIC) 15 MG tablet Take 1 tablet (15 mg total) by mouth daily as needed.  . metFORMIN (GLUCOPHAGE) 1000 MG tablet TAKE 1 TABLET BY MOUTH TWICE A DAY  . Multiple Vitamin tablet Take 1 tablet by mouth daily.  .Glory RosebushULTRA test strip USE 1 STRIP TO CHECK BLOOD SUGAR ONCE DAILY  . rOPINIRole (REQUIP) 2 MG tablet Take 2 mg by mouth at bedtime.  . ferrous sulfate 325 (65 FE) MG tablet Take 325 mg by mouth daily. (Patient not taking: Reported on 05/11/2020)  . glipiZIDE (GLUCOTROL XL) 2.5 MG 24 hr tablet Take 1 tablet (2.5 mg total) by mouth daily with breakfast. Take as needed during times when blood sugar runs higher than usual (Patient not taking: Reported on 05/11/2020)  . pramipexole (MIRAPEX) 0.5 MG tablet TAKE 1 TABLET (0.5 MG TOTAL) BY MOUTH AT BEDTIME. (Patient not taking: Reported on 05/11/2020)  . simvastatin (ZOCOR) 20 MG tablet TAKE 1 TABLET BY MOUTH EVERY DAY (Patient not taking: Reported on 05/11/2020)   No facility-administered encounter medications on file as of 05/11/2020.    Allergies (verified)  Patient has no known allergies.   History: Past Medical History:  Diagnosis Date  . Anemia   . Cyst, pilonidal, with abscess 06/12/2015  . Diabetes mellitus without complication ([place])    type 2  . Hemorrhoid 06/12/2015  . Hip strain   . Hyperlipidemia   . Hypertension    Past Surgical History:  Procedure Laterality Date  . LUNG SURGERY Left 1985    S/P RECURRENT PNEUMOTHORAX  . VASECTOMY  1981   Family History  Problem Relation Age of Onset  . Cancer Father        liver  . Cancer Brother        stomach and intestine  . Diabetes Brother   . Crohn's disease Brother    Social History   Socioeconomic History  . Marital status: Married    Spouse name: Jeremiah Meyer  . Number of children: 1  . Years of education: Not on file  . Highest education level: Associate degree: occupational, Hotel manager, or vocational program  Occupational History  . 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 Smoking status: Former Smoker    Types: Cigarettes    Quit date: 10/24/1984    Years since quitting: 35.5  . Smokeless tobacco: Never Used  . Tobacco comment: Quit 1986  Vaping Use  . Vaping Use: Never used  Substance and Sexual Activity  . Alcohol use: Yes    Comment: beer 1-2 monthly  . Drug use: Yes    Types: Marijuana  . Sexual activity: Not on file  Other Topics Concern  . Not on file  Social History Narrative   Has one son who lives in [place]   Social Determinants of Health   Financial Resource Strain: [place]   . Difficulty of Paying Living Expenses: Not hard at all  Food Insecurity: No Food Insecurity  . Worried About Charity fundraiser in the Last Year: Never true  . Ran Out of Food in the Last Year: Never true  Transportation Needs: No Transportation Needs  . Lack of Transportation (Medical): No  . Lack of Transportation (Non-Medical): No  Physical Activity: Inactive  . Days of Exercise per Week: 0 days  . Minutes of Exercise per Session: 0 min  Stress: No Stress Concern Present  . Feeling of Stress : Only a little  Social Connections: Moderately Isolated  . Frequency of Communication with Friends and Family: Twice a week  . Frequency of Social Gatherings with Friends and Family: Once a week  . Attends Religious Services: Never  . Active Member of Clubs or Organizations: No    . Attends Archivist Meetings: Never  . Marital Status: Married    Tobacco Counseling Counseling given: Not Answered Comment: Quit 1986   Clinical Intake:  Pre-visit preparation completed: Yes  Pain : No/denies pain Pain Score: 0-No pain     Nutritional Risks: None Diabetes: Yes  How often do you need to have someone help you when you read instructions, pamphlets, or other written materials from your doctor or pharmacy?: 1 - Never  Diabetic? Yes  Nutrition Risk Assessment:  Has the patient had any N/V/D within the last 2 months?  No  Does the patient have any non-healing wounds?  No  Has the patient had any unintentional weight loss or weight gain?  No   Diabetes:  Is the patient diabetic?  Yes  If diabetic, was a CBG obtained today?  No  Did the  patient bring in their glucometer from home?  No  How often do you monitor your CBG's? Occasionally, not often.   Financial Strains and Diabetes Management:  Are you having any financial strains with the device, your supplies or your medication? No .  Does the patient want to be seen by Chronic Care Management for management of their diabetes?  No  Would the patient like to be referred to a Nutritionist or for Diabetic Management?  No   Diabetic Exams:  Diabetic Eye Exam: Completed in May of this year. Requested records to be faxed to clinic. Diabetic Foot Exam: Currently due. Note made to follow up on this at next in office appt.    Interpreter Needed?: No  Information entered by :: [facility], LPN   Activities of Daily Living In your present state of health, do you have any difficulty performing the following activities: 05/11/2020  Hearing? N  Vision? N  Difficulty concentrating or making decisions? N  Walking or climbing stairs? N  Dressing or bathing? N  Doing errands, shopping? N  Preparing Food and eating ? N  Using the Toilet? N  In the past six months, have you accidently leaked urine? N   Do you have problems with loss of bowel control? N  Managing your Medications? N  Managing your Finances? N  Housekeeping or managing your Housekeeping? N  Some recent data might be hidden    Patient Care Team: Birdie Sons, MD as PCP - General (Family Medicine) Anell Barr, OD as Consulting Physician (Optometry)  Indicate any recent Medical Services you may have received from other than [facility] providers in the past year (date may be approximate).     Assessment:   This is a routine wellness examination for [place].  Hearing/Vision screen No exam data present  Dietary issues and exercise activities discussed: Current Exercise Habits: The patient does not participate in regular exercise at present, Exercise limited by: None identified  Goals    . DIET - INCREASE WATER INTAKE     Recommend increasing water intake to 6-8 glasses a day.     . Exercise 150 minutes per week (moderate activity)      Depression Screen PHQ 2/9 Scores 05/11/2020 05/02/2019 04/18/2018 05/22/2017 02/01/2017 02/01/2017 01/27/2016  PHQ - 2 Score 0 _0 PHQ- 9 Score - - - - 1 - -    Fall Risk Fall Risk  05/11/2020 09/02/2019 05/02/2019 04/18/2018 02/01/2017  Falls in the past year? 0 1 0 No No  Number falls in past yr: 0 1 - - -  Injury with Fall? 0 0 - - -    Any stairs in or around the home? Yes  If so, are there any without handrails? No  Home free of loose throw rugs in walkways, pet beds, electrical cords, etc? Yes  Adequate lighting in your home to reduce risk of falls? Yes   ASSISTIVE DEVICES UTILIZED TO PREVENT FALLS:  Life alert? No  Use of a cane, walker or w/c? No  Grab bars in the bathroom? No  Shower chair or bench in shower? No  Elevated toilet seat or a handicapped toilet? No    Cognitive Function: Declined today.     6CIT Screen 02/01/2017  What Year? 0 points  What month? 0 points  What time? 0 points  Count back from 20 0 points  Months in reverse 0 points  Repeat  phrase 4 points  Total Score 4  Immunizations Immunization History  Administered Date(s) Administered  . Influenza, High Dose Seasonal PF 06/19/2017, 08/14/2018, 06/28/2019  . Influenza,inj,Quad PF,6+ Mos 08/25/2014, 09/04/2015  . Pneumococcal Conjugate-13 01/27/2016  . Pneumococcal Polysaccharide-23 09/09/2013, 05/03/2019  . Td 10/24/1997  . Tdap 12/06/2018  . Zoster Recombinat (Shingrix) 05/03/2019, 01/24/2020    TDAP status: Up to date Flu Vaccine status: Up to date Pneumococcal vaccine status: Up to date Covid-19 vaccine status: Completed vaccines  Qualifies for Shingles Vaccine? Yes   Zostavax completed No   Shingrix Completed?: Yes  Screening Tests Health Maintenance  Topic Date Due  . COVID-19 Vaccine (1) Never done  . OPHTHALMOLOGY EXAM  12/20/2018  . FOOT EXAM  05/02/2020  . INFLUENZA VACCINE  05/24/2020  . HEMOGLOBIN A1C  07/01/2020  . COLONOSCOPY  08/25/2020  . TETANUS/TDAP  12/06/2028  . Hepatitis C Screening  Completed  . PNA vac Low Risk Adult  Completed    Health Maintenance  Health Maintenance Due  Topic Date Due  . COVID-19 Vaccine (1) Never done  . OPHTHALMOLOGY EXAM  12/20/2018  . FOOT EXAM  05/02/2020    Colorectal cancer screening: Completed 08/25/10. Repeat every 10 years  Lung Cancer Screening: (Low Dose CT Chest recommended if Age 19-80 years, 30 pack-year currently smoking OR have quit w/in 15years.) does not qualify.    Additional Screening:  Hepatitis C Screening: Up to date  Vision Screening: Recommended annual ophthalmology exams for early detection of glaucoma and other disorders of the eye. Is the patient up to date with their annual eye exam?  Yes  Who is the provider or what is the name of the office in which the patient attends annual eye exams? Dr Ellin Mayhew If pt is not established with a provider, would they like to be referred to a provider to establish care? No .   Dental Screening: Recommended annual dental exams for  proper oral hygiene  Community Resource Referral / Chronic Care Management: CRR required this visit?  No   CCM required this visit?  No      Plan:     I have personally reviewed and noted the following in the patient's chart:   . Medical and social history . Use of alcohol, tobacco or illicit drugs  . Current medications and supplements . Functional ability and status . Nutritional status . Physical activity . Advanced directives . List of other physicians . Hospitalizations, surgeries, and ER visits in previous 12 months . Vitals . Screenings to include cognitive, depression, and falls . Referrals and appointments  In addition, I have reviewed and discussed with patient certain preventive protocols, quality metrics, and best practice recommendations. A written personalized care plan for preventive services as well as general preventive health recommendations were provided to patient.     Roselynn Whitacre [place], [place]   01/20/5187   Nurse Notes: Pt needs a diabetic foot exam at next in office apt. Requested previous eye exam records. Advised pt to bring in COVID vaccine card to update completion date.

## 2020-05-11 ENCOUNTER — Other Ambulatory Visit: Payer: Self-pay

## 2020-05-11 ENCOUNTER — Ambulatory Visit (INDEPENDENT_AMBULATORY_CARE_PROVIDER_SITE_OTHER): Payer: PPO

## 2020-05-11 DIAGNOSIS — Z0000 Encounter for general adult medical examination without abnormal findings: Secondary | ICD-10-CM

## 2020-05-11 NOTE — Patient Instructions (Signed)
Jeremiah Meyer , Thank you for taking time to come for your Medicare Wellness Visit. I appreciate your ongoing commitment to your health goals. Please review the following plan we discussed and let me know if I can assist you in the future.   Screening recommendations/referrals: Colonoscopy: Up to date, due 08/2020 Recommended yearly ophthalmology/optometry visit for glaucoma screening and checkup Recommended yearly dental visit for hygiene and checkup  Vaccinations: Influenza vaccine: Done 06/28/19 Pneumococcal vaccine: Completed series Tdap vaccine: Up to date, due 11/2028 Shingles vaccine: Completed series    Advanced directives: Advance directive discussed with you today. Even though you declined this today please call our office should you change your mind and we can give you the proper paperwork for you to fill out.  Conditions/risks identified: Recommend increasing water intake to 6-8 glasses a day.   Next appointment: 05/20/20 @ 9:00 AM with Dr Sherrie Mustache   Preventive Care 65 Years and Older, Male Preventive care refers to lifestyle choices and visits with your health care provider that can promote health and wellness. What does preventive care include?  A yearly physical exam. This is also called an annual well check.  Dental exams once or twice a year.  Routine eye exams. Ask your health care provider how often you should have your eyes checked.  Personal lifestyle choices, including:  Daily care of your teeth and gums.  Regular physical activity.  Eating a healthy diet.  Avoiding tobacco and drug use.  Limiting alcohol use.  Practicing safe sex.  Taking low doses of aspirin every day.  Taking vitamin and mineral supplements as recommended by your health care provider. What happens during an annual well check? The services and screenings done by your health care provider during your annual well check will depend on your age, overall health, lifestyle risk factors, and  family history of disease. Counseling  Your health care provider may ask you questions about your:  Alcohol use.  Tobacco use.  Drug use.  Emotional well-being.  Home and relationship well-being.  Sexual activity.  Eating habits.  History of falls.  Memory and ability to understand (cognition).  Work and work Astronomer. Screening  You may have the following tests or measurements:  Height, weight, and BMI.  Blood pressure.  Lipid and cholesterol levels. These may be checked every 5 years, or more frequently if you are over 72 years old.  Skin check.  Lung cancer screening. You may have this screening every year starting at age 83 if you have a 30-pack-year history of smoking and currently smoke or have quit within the past 15 years.  Fecal occult blood test (FOBT) of the stool. You may have this test every year starting at age 5.  Flexible sigmoidoscopy or colonoscopy. You may have a sigmoidoscopy every 5 years or a colonoscopy every 10 years starting at age 21.  Prostate cancer screening. Recommendations will vary depending on your family history and other risks.  Hepatitis C blood test.  Hepatitis B blood test.  Sexually transmitted disease (STD) testing.  Diabetes screening. This is done by checking your blood sugar (glucose) after you have not eaten for a while (fasting). You may have this done every 1-3 years.  Abdominal aortic aneurysm (AAA) screening. You may need this if you are a current or former smoker.  Osteoporosis. You may be screened starting at age 68 if you are at high risk. Talk with your health care provider about your test results, treatment options, and if necessary, the  need for more tests. Vaccines  Your health care provider may recommend certain vaccines, such as:  Influenza vaccine. This is recommended every year.  Tetanus, diphtheria, and acellular pertussis (Tdap, Td) vaccine. You may need a Td booster every 10 years.  Zoster  vaccine. You may need this after age 68.  Pneumococcal 13-valent conjugate (PCV13) vaccine. One dose is recommended after age 39.  Pneumococcal polysaccharide (PPSV23) vaccine. One dose is recommended after age 74. Talk to your health care provider about which screenings and vaccines you need and how often you need them. This information is not intended to replace advice given to you by your health care provider. Make sure you discuss any questions you have with your health care provider. Document Released: 11/06/2015 Document Revised: 06/29/2016 Document Reviewed: 08/11/2015 Elsevier Interactive Patient Education  2017 [place] Prevention in the Home Falls can cause injuries. They can happen to people of all ages. There are many things you can do to make your home safe and to help prevent falls. What can I do on the outside of my home?  Regularly fix the edges of walkways and driveways and fix any cracks.  Remove anything that might make you trip as you walk through a door, such as a raised step or threshold.  Trim any bushes or trees on the path to your home.  Use bright outdoor lighting.  Clear any walking paths of anything that might make someone trip, such as rocks or tools.  Regularly check to see if handrails are loose or broken. Make sure that both sides of any steps have handrails.  Any raised decks and porches should have guardrails on the edges.  Have any leaves, snow, or ice cleared regularly.  Use sand or salt on walking paths during winter.  Clean up any spills in your garage right away. This includes oil or grease spills. What can I do in the bathroom?  Use night lights.  Install grab bars by the toilet and in the tub and shower. Do not use towel bars as grab bars.  Use non-skid mats or decals in the tub or shower.  If you need to sit down in the shower, use a plastic, non-slip stool.  Keep the floor dry. Clean up any water that spills on the  floor as soon as it happens.  Remove soap buildup in the tub or shower regularly.  Attach bath mats securely with double-sided non-slip rug tape.  Do not have throw rugs and other things on the floor that can make you trip. What can I do in the bedroom?  Use night lights.  Make sure that you have a light by your bed that is easy to reach.  Do not use any sheets or blankets that are too big for your bed. They should not hang down onto the floor.  Have a firm chair that has side arms. You can use this for support while you get dressed.  Do not have throw rugs and other things on the floor that can make you trip. What can I do in the kitchen?  Clean up any spills right away.  Avoid walking on wet floors.  Keep items that you use a lot in easy-to-reach places.  If you need to reach something above you, use a strong step stool that has a grab bar.  Keep electrical cords out of the way.  Do not use floor polish or wax that makes floors slippery. If you must use wax,  use non-skid floor wax.  Do not have throw rugs and other things on the floor that can make you trip. What can I do with my stairs?  Do not leave any items on the stairs.  Make sure that there are handrails on both sides of the stairs and use them. Fix handrails that are broken or loose. Make sure that handrails are as long as the stairways.  Check any carpeting to make sure that it is firmly attached to the stairs. Fix any carpet that is loose or worn.  Avoid having throw rugs at the top or bottom of the stairs. If you do have throw rugs, attach them to the floor with carpet tape.  Make sure that you have a light switch at the top of the stairs and the bottom of the stairs. If you do not have them, ask someone to add them for you. What else can I do to help prevent falls?  Wear shoes that:  Do not have high heels.  Have rubber bottoms.  Are comfortable and fit you well.  Are closed at the toe. Do not wear  sandals.  If you use a stepladder:  Make sure that it is fully opened. Do not climb a closed stepladder.  Make sure that both sides of the stepladder are locked into place.  Ask someone to hold it for you, if possible.  Clearly mark and make sure that you can see:  Any grab bars or handrails.  First and last steps.  Where the edge of each step is.  Use tools that help you move around (mobility aids) if they are needed. These include:  Canes.  Walkers.  Scooters.  Crutches.  Turn on the lights when you go into a dark area. Replace any light bulbs as soon as they burn out.  Set up your furniture so you have a clear path. Avoid moving your furniture around.  If any of your floors are uneven, fix them.  If there are any pets around you, be aware of where they are.  Review your medicines with your doctor. Some medicines can make you feel dizzy. This can increase your chance of falling. Ask your doctor what other things that you can do to help prevent falls. This information is not intended to replace advice given to you by your health care provider. Make sure you discuss any questions you have with your health care provider. Document Released: 08/06/2009 Document Revised: 03/17/2016 Document Reviewed: 11/14/2014 Elsevier Interactive Patient Education  2017 [employer].

## 2020-05-20 ENCOUNTER — Other Ambulatory Visit: Payer: Self-pay

## 2020-05-20 ENCOUNTER — Ambulatory Visit (INDEPENDENT_AMBULATORY_CARE_PROVIDER_SITE_OTHER): Payer: PPO | Admitting: Family Medicine

## 2020-05-20 ENCOUNTER — Encounter: Payer: Self-pay | Admitting: Family Medicine

## 2020-05-20 VITALS — BP 125/72 | HR 62 | Temp 97.3°F | Ht 64.0 in | Wt 127.6 lb

## 2020-05-20 DIAGNOSIS — E611 Iron deficiency: Secondary | ICD-10-CM | POA: Diagnosis not present

## 2020-05-20 DIAGNOSIS — Z125 Encounter for screening for malignant neoplasm of prostate: Secondary | ICD-10-CM

## 2020-05-20 DIAGNOSIS — Z0000 Encounter for general adult medical examination without abnormal findings: Secondary | ICD-10-CM | POA: Diagnosis not present

## 2020-05-20 DIAGNOSIS — Z1329 Encounter for screening for other suspected endocrine disorder: Secondary | ICD-10-CM | POA: Diagnosis not present

## 2020-05-20 DIAGNOSIS — G2581 Restless legs syndrome: Secondary | ICD-10-CM

## 2020-05-20 DIAGNOSIS — I10 Essential (primary) hypertension: Secondary | ICD-10-CM | POA: Diagnosis not present

## 2020-05-20 DIAGNOSIS — Z136 Encounter for screening for cardiovascular disorders: Secondary | ICD-10-CM | POA: Diagnosis not present

## 2020-05-20 DIAGNOSIS — E538 Deficiency of other specified B group vitamins: Secondary | ICD-10-CM

## 2020-05-20 DIAGNOSIS — E119 Type 2 diabetes mellitus without complications: Secondary | ICD-10-CM

## 2020-05-20 DIAGNOSIS — Z13228 Encounter for screening for other metabolic disorders: Secondary | ICD-10-CM | POA: Diagnosis not present

## 2020-05-20 DIAGNOSIS — Z130 Encounter for screening for diseases of the blood and blood-forming organs and certain disorders involving the immune mechanism: Secondary | ICD-10-CM | POA: Diagnosis not present

## 2020-05-20 MED ORDER — ROSUVASTATIN CALCIUM 5 MG PO TABS: 5.0000 mg | ORAL_TABLET | Freq: Every day | ORAL | 1 refills | Status: DC

## 2020-05-20 NOTE — Progress Notes (Signed)
Complete physical exam   Patient: Jeremiah Meyer   DOB: November 21, 1950   69 y.o. Male  MRN: [id number] Visit Date: 05/20/2020  Today's healthcare provider: Lelon Huh, MD   Chief Complaint  Patient presents with  . Annual Exam  . Diabetes  . Hyperlipidemia  . Hypertension   Subjective    Jeremiah Meyer is a 69 y.o. male who presents today for a complete physical exam.  He reports consuming a general diet. The patient does not participate in regular exercise at present. He generally feels fairly well. He reports sleeping well. He does have additional problems to discuss today.   Had AWV with [facility] on 05/11/2020  HPI  Diabetes Mellitus Type II, follow-up  Lab Results  Component Value Date   HGBA1C 7.1 (A) 12/30/2019   HGBA1C 6.3 (A) 09/02/2019   HGBA1C 6.2 (H) 05/03/2019   Last seen for diabetes 4 months ago.  Management since then includes continuing the same treatment. He reports fair compliance with treatment. Patient is not taking Glipizide. He is not having side effects.   Home blood sugar records: fasting range: patient is not checking  Episodes of hypoglycemia? No    Current insulin regiment: None Most Recent Eye Exam: 12/20/2017  --------------------------------------------------------------------------------------------------- Hypertension, follow-up  BP Readings from Last 3 Encounters:  12/30/19 128/76  09/02/19 127/66  05/03/19 126/70   Wt Readings from Last 3 Encounters:  12/30/19 133 lb (60.3 kg)  09/02/19 124 lb 6.4 oz (56.4 kg)  05/03/19 125 lb (56.7 kg)     He was last seen for hypertension 4 months ago.  BP at that visit was 128/76. Management since that visit includes no change. He reports good compliance with treatment. He is not having side effects.  He is not exercising. He is adherent to low salt diet.   Outside blood pressures are not being checked at home.  He does not smoke.  Use of agents associated with hypertension:  none.   --------------------------------------------------------------------------------------------------- Lipid/Cholesterol, follow-up  Last Lipid Panel: Lab Results  Component Value Date   CHOL 136 12/30/2019   LDLCALC 72 12/30/2019   HDL 51 12/30/2019   TRIG 61 12/30/2019    He was last seen for this 4 months ago.  Management since that visit includes no change.  He reports fair compliance with treatment. Patient is not taking Simvastatin. He states he had some back and leg pains that went away when he stopped taking the simvastatin. He Is wondering if there is another medication that may be less likely to affect muscles.  He is not having side effects.   Symptoms: No appetite changes No foot ulcerations  No chest pain No chest pressure/discomfort  No dyspnea No orthopnea  No fatigue No lower extremity edema  No palpitations No paroxysmal nocturnal dyspnea  No nausea No numbness or tingling of extremity  No polydipsia No polyuria  No speech difficulty No syncope   He is following a Regular diet. Current exercise: none  Last metabolic panel Lab Results  Component Value Date   GLUCOSE 190 (H) 12/30/2019   NA 137 12/30/2019   K 5.3 (H) 12/30/2019   BUN 14 12/30/2019   CREATININE 1.07 12/30/2019   GFRNONAA 71 12/30/2019   GFRAA 82 12/30/2019   CALCIUM 9.2 12/30/2019   AST 24 12/30/2019   ALT 22 12/30/2019   The 10-year ASCVD risk score Mikey Bussing DC Jr., et al., 2013) is: 28.3%  ---------------------------------------------------------------------------------------------------   Past Medical History:  Diagnosis  Date  . Anemia   . Cyst, pilonidal, with abscess 06/12/2015  . Diabetes mellitus without complication ([place])    type 2  . Hemorrhoid 06/12/2015  . Hip strain   . Hyperlipidemia   . Hypertension    Past Surgical History:  Procedure Laterality Date  . LUNG SURGERY Left 1985   S/P RECURRENT PNEUMOTHORAX  . VASECTOMY  1981   Social History    Socioeconomic History  . Marital status: Married    Spouse name: Raiford Noble  . Number of children: 1  . Years of education: Not on file  . Highest education level: Associate degree: occupational, Hotel manager, or vocational program  Occupational History  . 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 Smoking status: Former Smoker    Types: Cigarettes    Quit date: 10/24/1984    Years since quitting: 35.5  . Smokeless tobacco: Never Used  . Tobacco comment: Quit 1986  Vaping Use  . Vaping Use: Never used  Substance and Sexual Activity  . Alcohol use: Yes    Comment: beer 1-2 monthly  . Drug use: Yes    Types: Marijuana  . Sexual activity: Not on file  Other Topics Concern  . Not on file  Social History Narrative   Has one son who lives in [place]   Social Determinants of Health   Financial Resource Strain: [place]   . Difficulty of Paying Living Expenses: Not hard at all  Food Insecurity: No Food Insecurity  . Worried About Charity fundraiser in the Last Year: Never true  . Ran Out of Food in the Last Year: Never true  Transportation Needs: No Transportation Needs  . Lack of Transportation (Medical): No  . Lack of Transportation (Non-Medical): No  Physical Activity: Inactive  . Days of Exercise per Week: 0 days  . Minutes of Exercise per Session: 0 min  Stress: No Stress Concern Present  . Feeling of Stress : Only a little  Social Connections: Moderately Isolated  . Frequency of Communication with Friends and Family: Twice a week  . Frequency of Social Gatherings with Friends and Family: Once a week  . Attends Religious Services: Never  . Active Member of Clubs or Organizations: No  . Attends Archivist Meetings: Never  . Marital Status: Married  Human resources officer Violence: Not At Risk  . Fear of Current or Ex-Partner: No  . Emotionally Abused: No  . Physically Abused: No  . Sexually Abused: No   Family Status   Relation Name Status  . Mother  Alive  . Father  Deceased  . Sister  Alive  . Brother  Deceased  . Brother  Alive  . Brother  Alive  . Brother  Alive  . Brother  Alive   Family History  Problem Relation Age of Onset  . Cancer Father        liver  . Cancer Brother        stomach and intestine  . Diabetes Brother   . Crohn's disease Brother    No Known Allergies  Patient Care Team: Birdie Sons, MD as PCP - General (Family Medicine) Anell Barr, OD as Consulting Physician (Optometry)   Medications: Outpatient Medications Prior to Visit  Medication Sig  . aspirin 81 MG tablet Take 81 mg by mouth daily.  . betamethasone dipropionate (DIPROLENE) 0.05 % cream Apply topically daily. (Patient taking differently: Apply topically daily. As needed for psoriasis  flare ups)  . Blood Glucose Monitoring Suppl (ONE TOUCH ULTRA 2) w/Device KIT Use to check blood sugar daily for type 2 diabetes  . cyanocobalamin (,VITAMIN B-12,) 1000 MCG/ML injection INJECT 1 MLS MONTHLY AS DIRECTED  . JANUVIA 100 MG tablet TAKE 1 TABLET BY MOUTH EVERY DAY  . lisinopril-hydrochlorothiazide (ZESTORETIC) 10-12.5 MG tablet TAKE 1 TABLET BY MOUTH EVERY DAY  . meloxicam (MOBIC) 15 MG tablet Take 1 tablet (15 mg total) by mouth daily as needed.  . metFORMIN (GLUCOPHAGE) 1000 MG tablet TAKE 1 TABLET BY MOUTH TWICE A DAY  . Multiple Vitamin tablet Take 1 tablet by mouth daily.  [name] test strip USE 1 STRIP TO CHECK BLOOD SUGAR ONCE DAILY  . rOPINIRole (REQUIP) 2 MG tablet Take 2 mg by mouth at bedtime.  . simvastatin (ZOCOR) 20 MG tablet TAKE 1 TABLET BY MOUTH EVERY DAY  . ferrous sulfate 325 (65 FE) MG tablet Take 325 mg by mouth daily. (Patient not taking: Reported on 05/11/2020)  . glipiZIDE (GLUCOTROL XL) 2.5 MG 24 hr tablet Take 1 tablet (2.5 mg total) by mouth daily with breakfast. Take as needed during times when blood sugar runs higher than usual (Patient not taking: Reported on 05/11/2020)   . pramipexole (MIRAPEX) 0.5 MG tablet TAKE 1 TABLET (0.5 MG TOTAL) BY MOUTH AT BEDTIME. (Patient not taking: Reported on 05/11/2020)   No facility-administered medications prior to visit.    Review of Systems  Constitutional: Negative.   Respiratory: Negative.   Cardiovascular: Negative.   Endocrine: Negative.   Musculoskeletal: Negative.       Objective    There were no vitals taken for this visit.   Physical Exam   General Appearance:    Well developed, well nourished male. Alert, cooperative, in no acute distress, appears stated age  Head:    Normocephalic, without obvious abnormality, atraumatic  Eyes:    PERRL, conjunctiva/corneas clear, EOM's intact, fundi    benign, both eyes       Ears:    Normal TM's obstructed by cerumen bilaterally.   Neck:   Supple, symmetrical, trachea midline, no adenopathy;       thyroid:  No enlargement/tenderness/nodules; no carotid   bruit or JVD  Back:     Symmetric, no curvature, ROM normal, no CVA tenderness  Lungs:     Clear to auscultation bilaterally, respirations unlabored  Chest wall:    No tenderness or deformity  Heart:    Normal heart rate. Normal rhythm. No murmurs, rubs, or gallops.  S1 and S2 normal  Abdomen:     Soft, non-tender, bowel sounds active all four quadrants,    no masses, no organomegaly  Genitalia:    deferred  Rectal:    deferred  Extremities:   All extremities are intact. No cyanosis or edema  Pulses:   2+ and symmetric all extremities  Skin:   Skin color, texture, turgor normal, no rashes or lesions  Lymph nodes:   Cervical, supraclavicular, and axillary nodes normal  Neurologic:   CNII-XII intact. Normal strength, sensation and reflexes      throughout     Last depression screening scores PHQ 2/9 Scores 05/11/2020 05/02/2019 04/18/2018  PHQ - 2 Score 0 1 1  PHQ- 9 Score - - -   Last fall risk screening Fall Risk  05/11/2020  Falls in the past year? 0  Number falls in past yr: 0  Injury with Fall? 0    Last Audit-C alcohol use screening  Alcohol Use Disorder Test (AUDIT) 05/11/2020  1. How often do you have a drink containing alcohol? 1  2. How many drinks containing alcohol do you have on a typical day when you are drinking? 0  3. How often do you have six or more drinks on one occasion? 0  AUDIT-C Score 1  Alcohol Brief Interventions/Follow-up AUDIT Score <7 follow-up not indicated   A score of 3 or more in women, and 4 or more in men indicates increased risk for alcohol abuse, EXCEPT if all of the points are from question 1   No results found for any visits on 05/20/20.  Assessment & Plan    Routine Health Maintenance and Physical Exam  Exercise Activities and Dietary recommendations Goals    . DIET - INCREASE WATER INTAKE     Recommend increasing water intake to 6-8 glasses a day.     . Exercise 150 minutes per week (moderate activity)       Immunization History  Administered Date(s) Administered  . Influenza, High Dose Seasonal PF 06/19/2017, 08/14/2018, 06/28/2019  . Influenza,inj,Quad PF,6+ Mos 08/25/2014, 09/04/2015  . Pneumococcal Conjugate-13 01/27/2016  . Pneumococcal Polysaccharide-23 09/09/2013, 05/03/2019  . Td 10/24/1997  . Tdap 12/06/2018  . Zoster Recombinat (Shingrix) 05/03/2019, 01/24/2020    Health Maintenance  Topic Date Due  . COVID-19 Vaccine (1) Never done  . OPHTHALMOLOGY EXAM  12/20/2018  . FOOT EXAM  05/02/2020  . INFLUENZA VACCINE  05/24/2020  . HEMOGLOBIN A1C  07/01/2020  . COLONOSCOPY  08/25/2020  . TETANUS/TDAP  12/06/2028  . Hepatitis C Screening  Completed  . PNA vac Low Risk Adult  Completed    Discussed health benefits of physical activity, and encouraged him to engage in regular exercise appropriate for his age and condition.  1. Annual physical exam   2. Prostate cancer screening  - PSA  3. Controlled type 2 diabetes mellitus without complication, without long-term current use of insulin (HCC) Well controlled, although  he is concerned that diabetic diet makes it difficult to keep on wait. He is only taking glipizide intermittently based on his activity level, but advised that glipizide can contribute to weight gain if taken consistently.  - Comprehensive metabolic panel - Lipid panel - TSH - Hemoglobin A1c - rosuvastatin (CRESTOR) 5 MG tablet; Take 1 tablet (5 mg total) by mouth daily.  Dispense: 30 tablet; Refill: 1  4. Iron deficiency He is currently not taking iron supplements since last blood count was what he considered to be normal.  - CBC - Ferritin  5. B12 deficiency  - Vitamin B12  6. Restless leg He is alternating between ropinirole and pramipexole.   7. Essential (primary) hypertension Stable, Continue current medications.    - TSH - EKG 12-Lead  No follow-ups on file.     The entirety of the information documented in the History of Present Illness, Review of Systems and Physical Exam were personally obtained by me. Portions of this information were initially documented by the CMA and reviewed by me for thoroughness and accuracy.      Lelon Huh, MD  [facility] [phone] (phone) [phone] (fax)  [place]

## 2020-05-21 ENCOUNTER — Other Ambulatory Visit: Payer: Self-pay | Admitting: Family Medicine

## 2020-05-21 DIAGNOSIS — I10 Essential (primary) hypertension: Secondary | ICD-10-CM

## 2020-05-21 DIAGNOSIS — E119 Type 2 diabetes mellitus without complications: Secondary | ICD-10-CM

## 2020-05-21 LAB — FERRITIN: Ferritin: 65 ng/mL (ref 30–400)

## 2020-05-21 LAB — LIPID PANEL
Chol/HDL Ratio: 3.4 ratio (ref 0.0–5.0)
Cholesterol, Total: 173 mg/dL (ref 100–199)
HDL: 51 mg/dL (ref >39)
LDL Chol Calc (NIH): 108 mg/dL — ABNORMAL HIGH (ref 0–99)
Triglycerides: 77 mg/dL (ref 0–149)
VLDL Cholesterol Cal: 14 mg/dL (ref 5–40)

## 2020-05-21 LAB — CBC
Hematocrit: 40.1 % (ref 37.5–51.0)
Hemoglobin: 14 g/dL (ref 13.0–17.7)
MCH: 30.7 pg (ref 26.6–33.0)
MCHC: 34.9 g/dL (ref 31.5–35.7)
MCV: 88 fL (ref 79–97)
Platelets: 210 10*3/uL (ref 150–450)
RBC: 4.56 x10E6/uL (ref 4.14–5.80)
RDW: 12.9 % (ref 11.6–15.4)
WBC: 5.5 10*3/uL (ref 3.4–10.8)

## 2020-05-21 LAB — COMPREHENSIVE METABOLIC PANEL
ALT: 12 IU/L (ref 0–44)
AST: 15 IU/L (ref 0–40)
Albumin/Globulin Ratio: 2.2 (ref 1.2–2.2)
Albumin: 4.3 g/dL (ref 3.8–4.8)
Alkaline Phosphatase: 73 IU/L (ref 48–121)
BUN/Creatinine Ratio: 17 (ref 10–24)
BUN: 16 mg/dL (ref 8–27)
Bilirubin Total: 0.6 mg/dL (ref 0.0–1.2)
CO2: 24 mmol/L (ref 20–29)
Calcium: 8.9 mg/dL (ref 8.6–10.2)
Chloride: 101 mmol/L (ref 96–106)
Creatinine, Ser: 0.96 mg/dL (ref 0.76–1.27)
GFR calc Af Amer: 93 mL/min/{1.73_m2} (ref >59)
GFR calc non Af Amer: 80 mL/min/{1.73_m2} (ref >59)
Globulin, Total: 2 g/dL (ref 1.5–4.5)
Glucose: 204 mg/dL — ABNORMAL HIGH (ref 65–99)
Potassium: 4.4 mmol/L (ref 3.5–5.2)
Sodium: 139 mmol/L (ref 134–144)
Total Protein: 6.3 g/dL (ref 6.0–8.5)

## 2020-05-21 LAB — TSH: TSH: 1.27 u[IU]/mL (ref 0.450–4.500)

## 2020-05-21 LAB — VITAMIN B12: Vitamin B-12: 338 pg/mL (ref 232–1245)

## 2020-05-21 LAB — HEMOGLOBIN A1C
Est. average glucose Bld gHb Est-mCnc: 157 mg/dL
Hgb A1c MFr Bld: 7.1 % — ABNORMAL HIGH (ref 4.8–5.6)

## 2020-05-21 LAB — PSA: Prostate Specific Ag, Serum: 1.5 ng/mL (ref 0.0–4.0)

## 2020-06-13 ENCOUNTER — Other Ambulatory Visit: Payer: Self-pay | Admitting: Family Medicine

## 2020-06-13 DIAGNOSIS — E119 Type 2 diabetes mellitus without complications: Secondary | ICD-10-CM

## 2020-06-22 ENCOUNTER — Telehealth: Payer: Self-pay | Admitting: Family Medicine

## 2020-06-22 DIAGNOSIS — E119 Type 2 diabetes mellitus without complications: Secondary | ICD-10-CM

## 2020-06-22 MED ORDER — ROSUVASTATIN CALCIUM 5 MG PO TABS: 5.0000 mg | ORAL_TABLET | Freq: Every day | ORAL | 4 refills | Status: DC

## 2020-06-22 NOTE — Telephone Encounter (Signed)
Pt calling to let his dr know a couple of things. 1. Pt states he saw his iron level results from his last labs, and it has dropped significantly. He had stopped taking the iron supplements.  So he has started taking the iron supplements again.  2. Also, pt thinks his A1C is not were they should be, so he has started taking his glipiZIDE (GLUCOTROL XL) 2.5 MG 24 hr tablet.  Pt had plenty of it to last him for a while.  3. Pt wants a 90 day Rx on all his meds, the last time his rosuvastatin (CRESTOR) was sent in, it was for only 30 tabs w/ refill.  Pt will be due for refill 10/01

## 2020-08-21 ENCOUNTER — Other Ambulatory Visit: Payer: Self-pay | Admitting: Family Medicine

## 2020-08-21 ENCOUNTER — Telehealth: Payer: Self-pay | Admitting: Family Medicine

## 2020-08-21 DIAGNOSIS — G2581 Restless legs syndrome: Secondary | ICD-10-CM

## 2020-08-21 MED ORDER — PRAMIPEXOLE DIHYDROCHLORIDE 0.5 MG PO TABS: 0.5000 mg | ORAL_TABLET | Freq: Every day | ORAL | 3 refills | Status: DC

## 2020-08-21 NOTE — Telephone Encounter (Signed)
Patient is calling regarding medication that is discontinued. Patient is requesting a refill on pramipexole (MIRAPEX) 0.5 MG tablet [[id number]] DISCONTINUED Patient is requesting medication to be sent to [facility]. Please Advise CB- [phone]

## 2020-08-21 NOTE — Telephone Encounter (Signed)
Please advise. This medication is not listed on active medication list. Patient requesting refill.

## 2020-09-22 ENCOUNTER — Other Ambulatory Visit: Payer: Self-pay

## 2020-09-22 ENCOUNTER — Encounter: Payer: Self-pay | Admitting: Family Medicine

## 2020-09-22 ENCOUNTER — Ambulatory Visit (INDEPENDENT_AMBULATORY_CARE_PROVIDER_SITE_OTHER): Payer: PPO | Admitting: Family Medicine

## 2020-09-22 VITALS — BP 129/84 | HR 62 | Temp 98.1°F | Resp 16 | Wt 133.0 lb

## 2020-09-22 DIAGNOSIS — E119 Type 2 diabetes mellitus without complications: Secondary | ICD-10-CM

## 2020-09-22 DIAGNOSIS — E611 Iron deficiency: Secondary | ICD-10-CM

## 2020-09-22 NOTE — Progress Notes (Signed)
Established patient visit   Patient: Jeremiah Meyer   DOB: [birth year]   69 y.o. Male  MRN: [id number] Visit Date: 09/22/2020  Today's healthcare provider: Lelon Huh, MD   Chief Complaint  Patient presents with  . Anemia  . Diabetes  . Hypertension   Subjective    HPI  Diabetes Mellitus Type II, Follow-up  Lab Results  Component Value Date   HGBA1C 7.1 (H) 05/20/2020   HGBA1C 7.1 (A) 12/30/2019   HGBA1C 6.3 (A) 09/02/2019   Wt Readings from Last 3 Encounters:  09/22/20 133 lb (60.3 kg)  05/20/20 127 lb 9.6 oz (57.9 kg)  12/30/19 133 lb (60.3 kg)   Last seen for diabetes 4 months ago.  Management since then includes continue same medication. He reports good compliance with treatment. He is not having side effects.  Symptoms: No fatigue No foot ulcerations  No appetite changes No nausea  No paresthesia of the feet  No polydipsia  No polyuria No visual disturbances   No vomiting     Home blood sugar records: fasting range: 138-160's  Episodes of hypoglycemia? No    Current insulin regiment: none Most Recent Eye Exam: not UTD Current exercise: yard work Current diet habits: in general, an "unhealthy" diet  Pertinent Labs: Lab Results  Component Value Date   CHOL 173 05/20/2020   HDL 51 05/20/2020   LDLCALC 108 (H) 05/20/2020   TRIG 77 05/20/2020   CHOLHDL 3.4 05/20/2020   Lab Results  Component Value Date   NA 139 05/20/2020   K 4.4 05/20/2020   CREATININE 0.96 05/20/2020   GFRNONAA 80 05/20/2020   GFRAA 93 05/20/2020   GLUCOSE 204 (H) 05/20/2020     ---------------------------------------------------------------------------------------------------  Follow up for iron deficiency anemia:  The patient was last seen for this 4 months ago. Changes made at last visit include none; continue same medication.  He reports good compliance with treatment. He feels that condition is Unchanged. He is not having side effects.    -----------------------------------------------------------------------------------------  Hypertension, follow-up  BP Readings from Last 3 Encounters:  09/22/20 129/84  05/20/20 125/72  12/30/19 128/76   Wt Readings from Last 3 Encounters:  09/22/20 133 lb (60.3 kg)  05/20/20 127 lb 9.6 oz (57.9 kg)  12/30/19 133 lb (60.3 kg)     He was last seen for hypertension 4 months ago.  BP at that visit was 125/72. Management since that visit includes continue same medication.  He reports good compliance with treatment. He is not having side effects.  He is following a Regular diet. He is exercising. He does not smoke.  Use of agents associated with hypertension: NSAIDS.   Outside blood pressures are not checked. Symptoms: No chest pain No chest pressure  No palpitations No syncope  No dyspnea No orthopnea  No paroxysmal nocturnal dyspnea No lower extremity edema   Pertinent labs: Lab Results  Component Value Date   CHOL 173 05/20/2020   HDL 51 05/20/2020   LDLCALC 108 (H) 05/20/2020   TRIG 77 05/20/2020   CHOLHDL 3.4 05/20/2020   Lab Results  Component Value Date   NA 139 05/20/2020   K 4.4 05/20/2020   CREATININE 0.96 05/20/2020   GFRNONAA 80 05/20/2020   GFRAA 93 05/20/2020   GLUCOSE 204 (H) 05/20/2020     The 10-year ASCVD risk score Mikey Bussing DC Jr., et al., 2013) is: 32.1%   ---------------------------------------------------------------------------------------------------  Follow up for restless legs:  The patient was last seen  for this 4 months ago. Changes made at last visit include none; patient alternates between ropinirole and pramipexole.  He reports good compliance with treatment. He feels that condition is Unchanged. He is not having side effects.   -----------------------------------------------------------------------------------------       Medications: Outpatient Medications Prior to Visit  Medication Sig  . aspirin 81 MG tablet Take  81 mg by mouth daily.  . betamethasone dipropionate (DIPROLENE) 0.05 % cream Apply topically daily. (Patient taking differently: Apply topically daily. As needed for psoriasis flare ups)  . Blood Glucose Monitoring Suppl (ONE TOUCH ULTRA 2) w/Device KIT Use to check blood sugar daily for type 2 diabetes  . cyanocobalamin (,VITAMIN B-12,) 1000 MCG/ML injection INJECT 1 MLS MONTHLY AS DIRECTED  . ferrous sulfate 325 (65 FE) MG tablet Take 325 mg by mouth daily.   Marland Kitchen glipiZIDE (GLUCOTROL XL) 2.5 MG 24 hr tablet Take 1 tablet (2.5 mg total) by mouth daily with breakfast. Take as needed during times when blood sugar runs higher than usual  . JANUVIA 100 MG tablet TAKE 1 TABLET BY MOUTH EVERY DAY  . lisinopril-hydrochlorothiazide (ZESTORETIC) 10-12.5 MG tablet TAKE 1 TABLET BY MOUTH EVERY DAY  . meloxicam (MOBIC) 15 MG tablet Take 1 tablet (15 mg total) by mouth daily as needed.  . metFORMIN (GLUCOPHAGE) 1000 MG tablet TAKE 1 TABLET BY MOUTH TWICE A DAY  . Multiple Vitamin tablet Take 1 tablet by mouth daily.  [name] test strip USE 1 STRIP TO CHECK BLOOD SUGAR ONCE DAILY  . pramipexole (MIRAPEX) 0.5 MG tablet Take 1 tablet (0.5 mg total) by mouth at bedtime.  Marland Kitchen rOPINIRole (REQUIP) 2 MG tablet Take 2 mg by mouth at bedtime.  . rosuvastatin (CRESTOR) 5 MG tablet Take 1 tablet (5 mg total) by mouth daily.   No facility-administered medications prior to visit.    Review of Systems  Constitutional: Negative for appetite change, chills and fever.  Respiratory: Negative for chest tightness, shortness of breath and wheezing.   Cardiovascular: Negative for chest pain and palpitations.  Gastrointestinal: Negative for abdominal pain, nausea and vomiting.      Objective    BP 129/84 (BP Location: Left Arm, Patient Position: Sitting, Cuff Size: Normal)   Pulse 62   Temp 98.1 F (36.7 C) (Oral)   Resp 16   Wt 133 lb (60.3 kg)   BMI 22.83 kg/m    Physical Exam   General appearance: Well  developed, well nourished male, cooperative and in no acute distress Head: Normocephalic, without obvious abnormality, atraumatic Respiratory: Respirations even and unlabored, normal respiratory rate Extremities: All extremities are intact.  Skin: Skin color, texture, turgor normal. No rashes seen  Psych: Appropriate mood and affect. Neurologic: Mental status: Alert, oriented to person, place, and time, thought content appropriate.     Assessment & Plan     1. Iron deficiency  - Ferritin - CBC  2. Controlled type 2 diabetes mellitus without complication, without long-term current use of insulin (HCC)  - Hemoglobin A1c         The entirety of the information documented in the History of Present Illness, Review of Systems and Physical Exam were personally obtained by me. Portions of this information were initially documented by the CMA and reviewed by me for thoroughness and accuracy.      Lelon Huh, MD  [facility] [phone] (phone) [phone] (fax)  [place]

## 2020-09-22 NOTE — Patient Instructions (Signed)
.   Please review the attached list of medications and notify my office if there are any errors.   . Please bring all of your medications to every appointment so we can make sure that our medication list is the same as yours.   

## 2020-09-23 LAB — CBC
Hematocrit: 44.8 % (ref 37.5–51.0)
Hemoglobin: 15.5 g/dL (ref 13.0–17.7)
MCH: 31.6 pg (ref 26.6–33.0)
MCHC: 34.6 g/dL (ref 31.5–35.7)
MCV: 91 fL (ref 79–97)
Platelets: 217 10*3/uL (ref 150–450)
RBC: 4.9 x10E6/uL (ref 4.14–5.80)
RDW: 12.4 % (ref 11.6–15.4)
WBC: 6.8 10*3/uL (ref 3.4–10.8)

## 2020-09-23 LAB — HEMOGLOBIN A1C
Est. average glucose Bld gHb Est-mCnc: 140 mg/dL
Hgb A1c MFr Bld: 6.5 % — ABNORMAL HIGH (ref 4.8–5.6)

## 2020-09-23 LAB — FERRITIN: Ferritin: 54 ng/mL (ref 30–400)

## 2020-09-30 ENCOUNTER — Telehealth: Payer: Self-pay | Admitting: *Deleted

## 2020-09-30 NOTE — Chronic Care Management (AMB) (Signed)
  Chronic Care Management   Note  09/30/2020 Name: Jeremiah Meyer MRN: [id number] DOB: Feb 27, 1951  Jeremiah Meyer is a 69 y.o. year old male who is a primary care patient of Caryn Section, Kirstie Peri, MD. I reached out to Florestine Avers by phone today in response to a referral sent by [name]'s health plan.     [name] was given information about Chronic Care Management services today including:  1. CCM service includes personalized support from designated clinical staff supervised by his physician, including individualized plan of care and coordination with other care providers 2. 24/7 contact phone numbers for assistance for urgent and routine care needs. 3. Service will only be billed when office clinical staff spend 20 minutes or more in a month to coordinate care. 4. Only one practitioner may furnish and bill the service in a calendar month. 5. The patient may stop CCM services at any time (effective at the end of the month) by phone call to the office staff. 6. The patient will be responsible for cost sharing (co-pay) of up to 20% of the service fee (after annual deductible is met).  Patient agreed to services and verbal consent obtained.   Follow up plan: Telephone appointment with care management team member scheduled for:10/27/2020  [place] [facility]

## 2020-09-30 NOTE — Chronic Care Management (AMB) (Signed)
  Chronic Care Management   Outreach Note  09/30/2020 Name: [name] MRN: [id number] DOB: April 18, 1951  Jeremiah Meyer is a 69 y.o. year old male who is a primary care patient of Sherrie Mustache, Demetrios Isaacs, MD. I reached out to [name] by phone today in response to a referral sent by Jeremiah Meyer's health plan.     An unsuccessful telephone outreach was attempted today. The patient was referred to the case management team for assistance with care management and care coordination.   Follow Up Plan: A HIPAA compliant phone message was left for the patient providing contact information and requesting a return call. The care management team will reach out to the patient again over the next 7 days. If patient returns call to provider office, please advise to call Embedded Care Management Care Guide Gwenevere Ghazi at [phone].  Gwenevere Ghazi  Care Guide, Embedded Care Coordination [facility]

## 2020-10-13 ENCOUNTER — Other Ambulatory Visit: Payer: Self-pay | Admitting: Family Medicine

## 2020-10-15 ENCOUNTER — Other Ambulatory Visit: Payer: Self-pay | Admitting: Family Medicine

## 2020-10-15 DIAGNOSIS — E538 Deficiency of other specified B group vitamins: Secondary | ICD-10-CM

## 2020-10-27 ENCOUNTER — Ambulatory Visit (INDEPENDENT_AMBULATORY_CARE_PROVIDER_SITE_OTHER): Payer: PPO

## 2020-10-27 DIAGNOSIS — E119 Type 2 diabetes mellitus without complications: Secondary | ICD-10-CM

## 2020-10-27 DIAGNOSIS — I10 Essential (primary) hypertension: Secondary | ICD-10-CM

## 2020-10-27 NOTE — Chronic Care Management (AMB) (Signed)
Chronic Care Management   Initial Visit Note  10/27/2020 Name: Jeremiah Meyer MRN: [id number] DOB: October 15, 1951  Primary Care Provider: Birdie Sons, MD Reason for referral : Chronic Care Management   Jeremiah Meyer is a 70 y.o. year old male who is a primary care patient of Fisher, Kirstie Peri, MD. The CCM team was consulted for assistance with chronic disease management and care coordination. The initial telephonic outreach was conducted today.  Review of Jeremiah Meyer status, including review of consultants reports, relevant labs and test results was conducted today. Collaboration with appropriate care team members was performed as part of the comprehensive evaluation and provision of chronic care management services.    SDOH (Social Determinants of Health) assessments performed: Yes  SDOH Interventions   Flowsheet Row Most Recent Value  SDOH Interventions   Financial Strain Interventions Intervention Not Indicated  Stress Interventions Intervention Not Indicated  Transportation Interventions Intervention Not Indicated         Medications: Outpatient Encounter Medications as of 10/27/2020  Medication Sig Note  . aspirin 81 MG tablet Take 81 mg by mouth daily.   . cyanocobalamin (,VITAMIN B-12,) 1000 MCG/ML injection INJECT 1 MLS MONTHLY AS DIRECTED   . glipiZIDE (GLUCOTROL XL) 2.5 MG 24 hr tablet TAKE 1 TABLET (2.5 MG TOTAL) BY MOUTH DAILY WITH BREAKFAST.   Marland Kitchen JANUVIA 100 MG tablet TAKE 1 TABLET BY MOUTH EVERY DAY   . lisinopril-hydrochlorothiazide (ZESTORETIC) 10-12.5 MG tablet TAKE 1 TABLET BY MOUTH EVERY DAY   . meloxicam (MOBIC) 15 MG tablet Take 1 tablet (15 mg total) by mouth daily as needed. 09/02/2019: As needed   . metFORMIN (GLUCOPHAGE) 1000 MG tablet TAKE 1 TABLET BY MOUTH TWICE A DAY   . Multiple Vitamin tablet Take 1 tablet by mouth daily.   . pramipexole (MIRAPEX) 0.5 MG tablet Take 1 tablet (0.5 mg total) by mouth at bedtime.   . rosuvastatin (CRESTOR) 5 MG tablet  Take 1 tablet (5 mg total) by mouth daily.   . betamethasone dipropionate (DIPROLENE) 0.05 % cream Apply topically daily. (Patient taking differently: Apply topically daily. As needed for psoriasis flare ups)   . Blood Glucose Monitoring Suppl (ONE TOUCH ULTRA 2) w/Device KIT Use to check blood sugar daily for type 2 diabetes   . ferrous sulfate 325 (65 FE) MG tablet Take 325 mg by mouth daily.    [name] test strip USE 1 STRIP TO CHECK BLOOD SUGAR ONCE DAILY   . rOPINIRole (REQUIP) 2 MG tablet Take 2 mg by mouth at bedtime.    No facility-administered encounter medications on file as of 10/27/2020.     Objective:   Lab Results  Component Value Date   CHOL 173 05/20/2020   HDL 51 05/20/2020   LDLCALC 108 (H) 05/20/2020   TRIG 77 05/20/2020   CHOLHDL 3.4 05/20/2020   Patient Care Plan: Diabetes Type 2 (Adult)    Problem Identified: Disease Progression (Diabetes, Type 2)     Long-Range Goal: Disease Progression Prevented or Minimized   Start Date: 10/27/2020  Expected End Date: 02/24/2021  Priority: High  Note:   Objective:  Lab Results  Component Value Date   HGBA1C 6.5 (H) 09/22/2020 .   Lab Results  Component Value Date   CREATININE 0.96 05/20/2020   CREATININE 1.07 12/30/2019   CREATININE 0.92 05/03/2019 .   Marland Kitchen No results found for: EGFR Current Barriers:  . Chronic disease management and support related to Diabetes self-management  Case Manager  Clinical Goal(s):  Over the next 120 days, patient will: Marland Kitchen Demonstrate improved adherence to prescribed treatment plan for Diabetes self management as evidenced by daily monitoring and recording of CBG, adherence to ADA/ carb modified diet and adherence to prescribed medication regimen.  Interventions:  . Collaboration with Jeremiah Sons, MD regarding development and update of comprehensive plan of care as evidenced by provider attestation and co-signature . Inter-disciplinary care team collaboration (see longitudinal  plan of care) . Reviewed medications. Advised to continue taking medications as prescribed. Encouraged to notify team with concerns regarding prescription cost. . Provided information regarding importance of consistent blood glucose monitoring. Encouraged to monitor and maintain a log. . Reviewed s/sx of hypoglycemia and hyperglycemia along with recommended interventions. . Discussed nutritional intake. Encouraged to read  nutritional labels and continue compliance with recommended cardiac prudent, diabetic diet.  . Discussed and offered referrals for available Diabetes education classes. Reports doing well with diabetes self management. His A1C has decreased from 7.1% to 6.5%. Declined current need for additional resources.  . Discussed importance of completing recommended DM preventive care. Reports competing annual eye exams and routine foot care as recommended.   Patient Goals/Self-Care Activities Over the next 120 days, patient will:  - Continue to self administer medications as prescribed - Attend all scheduled provider appointments - Monitor blood glucose levels and utilize recommended treatment interventions for readings outside of the established range - Continue adherence to recommended cardiac prudent, diabetic diet.  - Notify provider or care management team with health related questions and concern as needed  Follow Up Plan:  -Will follow in three months.   Patient Care Plan: Hypertension (Adult)    Problem Identified: Hypertension (Hypertension)     Long-Range Goal: Hypertension Monitored   Start Date: 10/27/2020  Expected End Date: 02/24/2021  Priority: High  Note:   Objective:  . Last practice recorded BP readings:  BP Readings from Last 3 Encounters:  09/22/20 129/84  05/20/20 125/72  12/30/19 128/76 .   Marland Kitchen Most recent eGFR/CrCl: No results found for: EGFR  No components found for: CRCL  Current Barriers:  . Chronic Disease Management needs and support related to  Hypertension management.  Case Manager Clinical Goal(s):  Marland Kitchen Over the next 120 days, patient will demonstrate improved adherence to prescribed treatment plan for hypertension as evidenced by taking all medications as prescribed, monitoring and recording blood pressure, and adhering to recommended diet.  Interventions:  . Collaboration with Jeremiah Sons, MD regarding development and update of comprehensive plan of care as evidenced by provider attestation and co-signature . Inter-disciplinary care team collaboration (see longitudinal plan of care) . Provided education to patient re: stroke prevention, s/s of heart attack and stroke, DASH diet, complications of uncontrolled blood pressure . Reviewed medications and discussed importance of compliance . Discussed established blood pressure ranges and indications for notifying a provider. Encouraged to continue monitoring and recording BP readings. Reports monitoring routinely. Reports home readings have ranged in the 120's over 70's.  . Provided verbal information regarding complications r/t uncontrolled blood pressure. Discussed s/sx of heart attack, stroke along with worsening symptoms that require immediate medical attention. . Discussed current activity tolerance. Reports ambulating well with good activity tolerance. Encouraged to continue engaging in exercises as tolerated.  Patient Goals/Self-Care Activities: Over the next 120 days, patient will: - Continue self administering medications as prescribed - Attend all scheduled provider appointments - Monitor and record BP readings. - Continue adherence with recommended diet - Contact provider or  care management team with questions or new concerns.    Follow Up Plan:  Will follow up in three months      Jeremiah Meyer was given information about Chronic Care Management services including:  1. CCM service includes personalized support from designated clinical staff supervised by his  physician, including individualized plan of care and coordination with other care providers 2. 24/7 contact phone numbers for assistance for urgent and routine care needs. 3. Service will only be billed when office clinical staff spend 20 minutes or more in a month to coordinate care. 4. Only one practitioner may furnish and bill the service in a calendar month. 5. The patient may stop CCM services at any time (effective at the end of the month) by phone call to the office staff. 6. The patient will be responsible for cost sharing (co-pay) of up to 20% of the service fee (after annual deductible is met).  Patient agreed to services and verbal consent obtained.     PLAN A member of the care management team will follow up in three months.   Crist[facility] Care Management [facility] [phone]

## 2020-12-14 NOTE — Patient Instructions (Signed)
Thank you for allowing the Chronic Care Management team to participate in your care.  Patient Care Plan: Diabetes Type 2 (Adult)    Problem Identified: Disease Progression (Diabetes, Type 2)     Long-Range Goal: Disease Progression Prevented or Minimized   Start Date: 10/27/2020  Expected End Date: 02/24/2021  Priority: High  Note:   Objective:  Lab Results  Component Value Date   HGBA1C 6.5 (H) 09/22/2020 .   Lab Results  Component Value Date   CREATININE 0.96 05/20/2020   CREATININE 1.07 12/30/2019   CREATININE 0.92 05/03/2019 .   Jeremiah Meyer No results found for: EGFR Current Barriers:  . Chronic disease management and support related to Diabetes self-management  Case Manager Clinical Goal(s):  Over the next 120 days, patient will: Jeremiah Meyer Demonstrate improved adherence to prescribed treatment plan for Diabetes self management as evidenced by daily monitoring and recording of CBG, adherence to ADA/ carb modified diet and adherence to prescribed medication regimen.  Interventions:  . Collaboration with Birdie Sons, MD regarding development and update of comprehensive plan of care as evidenced by provider attestation and co-signature . Inter-disciplinary care team collaboration (see longitudinal plan of care) . Reviewed medications. Advised to continue taking medications as prescribed. Encouraged to notify team with concerns regarding prescription cost. . Provided information regarding importance of consistent blood glucose monitoring. Encouraged to monitor and maintain a log. . Reviewed s/sx of hypoglycemia and hyperglycemia along with recommended interventions. . Discussed nutritional intake. Encouraged to read  nutritional labels and continue compliance with recommended cardiac prudent, diabetic diet.  . Discussed and offered referrals for available Diabetes education classes. Reports doing well with diabetes self management. His A1C has decreased from 7.1% to 6.5%. Declined current need  for additional resources.  . Discussed importance of completing recommended DM preventive care. Reports competing annual eye exams and routine foot care as recommended.   Patient Goals/Self-Care Activities Over the next 120 days, patient will:  - Continue to self administer medications as prescribed - Attend all scheduled provider appointments - Monitor blood glucose levels and utilize recommended treatment interventions for readings outside of the established range - Continue adherence to recommended cardiac prudent, diabetic diet.  - Notify provider or care management team with health related questions and concern as needed  Follow Up Plan:  -Will follow in three months.   Patient Care Plan: Hypertension (Adult)    Problem Identified: Hypertension (Hypertension)     Long-Range Goal: Hypertension Monitored   Start Date: 10/27/2020  Expected End Date: 02/24/2021  Priority: High  Note:   Objective:  . Last practice recorded BP readings:  BP Readings from Last 3 Encounters:  09/22/20 129/84  05/20/20 125/72  12/30/19 128/76 .   Jeremiah Meyer Most recent eGFR/CrCl: No results found for: EGFR  No components found for: CRCL  Current Barriers:  . Chronic Disease Management needs and support related to Hypertension management.  Case Manager Clinical Goal(s):  Jeremiah Meyer Over the next 120 days, patient will demonstrate improved adherence to prescribed treatment plan for hypertension as evidenced by taking all medications as prescribed, monitoring and recording blood pressure, and adhering to recommended diet.  Interventions:  . Collaboration with Birdie Sons, MD regarding development and update of comprehensive plan of care as evidenced by provider attestation and co-signature . Inter-disciplinary care team collaboration (see longitudinal plan of care) . Provided education to patient re: stroke prevention, s/s of heart attack and stroke, DASH diet, complications of uncontrolled blood  pressure . Reviewed medications and  discussed importance of compliance . Discussed established blood pressure ranges and indications for notifying a provider. Encouraged to continue monitoring and recording BP readings. Reports monitoring routinely. Reports home readings have ranged in the 120's over 70's.  . Provided verbal information regarding complications r/t uncontrolled blood pressure. Discussed s/sx of heart attack, stroke along with worsening symptoms that require immediate medical attention. . Discussed current activity tolerance. Reports ambulating well with good activity tolerance. Encouraged to continue engaging in exercises as tolerated.  Patient Goals/Self-Care Activities: Over the next 120 days, patient will: - Continue self administering medications as prescribed - Attend all scheduled provider appointments - Monitor and record BP readings. - Continue adherence with recommended diet - Contact provider or care management team with questions or new concerns.    Follow Up Plan:  Will follow up in three months     Mr. Tamburrino was given information about Chronic Care Management services including:  1. CCM service includes personalized support from designated clinical staff supervised by his physician, including individualized plan of care and coordination with other care providers 2. 24/7 contact phone numbers for assistance for urgent and routine care needs. 3. Service will only be billed when office clinical staff spend 20 minutes or more in a month to coordinate care. 4. Only one practitioner may furnish and bill the service in a calendar month. 5. The patient may stop CCM services at any time (effective at the end of the month) by phone call to the office staff. 6. The patient will be responsible for cost sharing (co-pay) of up to 20% of the service fee (after annual deductible is met).  Patient agreed to services and verbal consent obtained.     Mr. Pickrell verbalized  understanding of the information discussed during the telephonic outreach today. Declined need for mailed/printed resources. A member of the care management team will follow up in three months.    [facility] Care Management [facility] [phone]

## 2020-12-20 ENCOUNTER — Other Ambulatory Visit: Payer: Self-pay | Admitting: Family Medicine

## 2020-12-20 DIAGNOSIS — I10 Essential (primary) hypertension: Secondary | ICD-10-CM

## 2020-12-20 NOTE — Telephone Encounter (Signed)
Requested Prescriptions  Pending Prescriptions Disp Refills  . lisinopril-hydrochlorothiazide (ZESTORETIC) 10-12.5 MG tablet [Pharmacy Med Name: LISINOPRIL-HCTZ 10-12.5 MG TAB] 90 tablet 0    Sig: TAKE 1 TABLET BY MOUTH EVERY DAY     Cardiovascular:  ACEI + Diuretic Combos Failed - 12/20/2020  8:58 AM      Failed - Na in normal range and within 180 days    Sodium  Date Value Ref Range Status  05/20/2020 139 134 - 144 mmol/L Final         Failed - K in normal range and within 180 days    Potassium  Date Value Ref Range Status  05/20/2020 4.4 3.5 - 5.2 mmol/L Final         Failed - Cr in normal range and within 180 days    Creat  Date Value Ref Range Status  10/09/2017 1.01 0.70 - 1.25 mg/dL Final    Comment:    For patients >42 years of age, the reference limit for Creatinine is approximately 13% higher for people identified as African-American. .    Creatinine, Ser  Date Value Ref Range Status  05/20/2020 0.96 0.76 - 1.27 mg/dL Final   Creatinine, POC  Date Value Ref Range Status  08/22/2016 n/a mg/dL Final         Failed - Ca in normal range and within 180 days    Calcium  Date Value Ref Range Status  05/20/2020 8.9 8.6 - 10.2 mg/dL Final         Passed - Patient is not pregnant      Passed - Last BP in normal range    BP Readings from Last 1 Encounters:  09/22/20 129/84         Passed - Valid encounter within last 6 months    Recent Outpatient Visits          2 months ago Iron deficiency   [facility] Malva Limes, MD   7 months ago Annual physical exam   [facility] Malva Limes, MD   11 months ago Controlled type 2 diabetes mellitus without complication, without long-term current use of insulin [facility])   [facility] Malva Limes, MD   1 year ago Controlled type 2 diabetes mellitus without complication, without long-term current use of insulin [facility])   [facility] Malva Limes, MD    1 year ago Annual physical exam   [facility] Malva Limes, MD      Future Appointments            In 1 month Fisher, Demetrios Isaacs, MD [facility]

## 2021-01-20 ENCOUNTER — Other Ambulatory Visit: Payer: Self-pay | Admitting: Family Medicine

## 2021-01-20 DIAGNOSIS — E119 Type 2 diabetes mellitus without complications: Secondary | ICD-10-CM

## 2021-01-20 NOTE — Telephone Encounter (Signed)
Requested Prescriptions  Pending Prescriptions Disp Refills  . JANUVIA 100 MG tablet [Pharmacy Med Name: JANUVIA 100 MG TABLET] 90 tablet 0    Sig: TAKE 1 TABLET BY MOUTH EVERY DAY     Endocrinology:  Diabetes - DPP-4 Inhibitors Passed - 01/20/2021  2:26 AM      Passed - HBA1C is between 0 and 7.9 and within 180 days    Hgb A1c MFr Bld  Date Value Ref Range Status  09/22/2020 6.5 (H) 4.8 - 5.6 % Final    Comment:             Prediabetes: 5.7 - 6.4          Diabetes: >6.4          Glycemic control for adults with diabetes: <7.0          Passed - Cr in normal range and within 360 days    Creat  Date Value Ref Range Status  10/09/2017 1.01 0.70 - 1.25 mg/dL Final    Comment:    For patients >33 years of age, the reference limit for Creatinine is approximately 13% higher for people identified as African-American. .    Creatinine, Ser  Date Value Ref Range Status  05/20/2020 0.96 0.76 - 1.27 mg/dL Final   Creatinine, POC  Date Value Ref Range Status  08/22/2016 n/a mg/dL Final         Passed - Valid encounter within last 6 months    Recent Outpatient Visits          4 months ago Iron deficiency   [facility] Sherrie Mustache, Demetrios Isaacs, MD   8 months ago Annual physical exam   [facility] Malva Limes, MD   1 year ago Controlled type 2 diabetes mellitus without complication, without long-term current use of insulin [facility])   [facility] Malva Limes, MD   1 year ago Controlled type 2 diabetes mellitus without complication, without long-term current use of insulin [facility])   [facility] Malva Limes, MD   1 year ago Annual physical exam   [facility] Malva Limes, MD      Future Appointments            In 5 days Fisher, Demetrios Isaacs, MD [facility], [facility]

## 2021-01-25 ENCOUNTER — Encounter: Payer: Self-pay | Admitting: Family Medicine

## 2021-01-25 ENCOUNTER — Ambulatory Visit (INDEPENDENT_AMBULATORY_CARE_PROVIDER_SITE_OTHER): Payer: PPO | Admitting: Family Medicine

## 2021-01-25 ENCOUNTER — Other Ambulatory Visit: Payer: Self-pay

## 2021-01-25 VITALS — BP 135/79 | HR 60 | Temp 98.8°F | Resp 16 | Wt 136.0 lb

## 2021-01-25 DIAGNOSIS — I10 Essential (primary) hypertension: Secondary | ICD-10-CM | POA: Diagnosis not present

## 2021-01-25 DIAGNOSIS — E119 Type 2 diabetes mellitus without complications: Secondary | ICD-10-CM

## 2021-01-25 DIAGNOSIS — Z1211 Encounter for screening for malignant neoplasm of colon: Secondary | ICD-10-CM | POA: Diagnosis not present

## 2021-01-25 DIAGNOSIS — J3089 Other allergic rhinitis: Secondary | ICD-10-CM

## 2021-01-25 LAB — POCT GLYCOSYLATED HEMOGLOBIN (HGB A1C)
Est. average glucose Bld gHb Est-mCnc: 134
Hemoglobin A1C: 6.3 % — AB (ref 4.0–5.6)

## 2021-01-25 MED ORDER — FLUTICASONE PROPIONATE 50 MCG/ACT NA SUSP: 2.0000 | Freq: Every day | NASAL | 2 refills | Status: AC

## 2021-01-25 NOTE — Progress Notes (Signed)
Established patient visit   Patient: Jeremiah Meyer   DOB: 1951/04/20   70 y.o. Male  MRN: [id number] Visit Date: 01/25/2021  Today's healthcare provider: Lelon Huh, MD   Chief Complaint  Patient presents with  . Diabetes  . Hypertension   Subjective    HPI  Diabetes Mellitus Type II, Follow-up  Lab Results  Component Value Date   HGBA1C 6.3 (A) 01/25/2021   HGBA1C 6.5 (H) 09/22/2020   HGBA1C 7.1 (H) 05/20/2020   Wt Readings from Last 3 Encounters:  01/25/21 136 lb (61.7 kg)  09/22/20 133 lb (60.3 kg)  05/20/20 127 lb 9.6 oz (57.9 kg)   Last seen for diabetes 4 months ago.  Management since then includes continue same medication. He reports good compliance with treatment. He is not having side effects.  Symptoms: No fatigue No foot ulcerations  No appetite changes No nausea  No paresthesia of the feet  No polydipsia  No polyuria No visual disturbances   No vomiting     Home blood sugar records: fasting range: 150-180  Episodes of hypoglycemia? Yes occurs 2 times a month   Current insulin regiment: none Most Recent Eye Exam: not UTD Current exercise: yard work Current diet habits: well balanced  Pertinent Labs: Lab Results  Component Value Date   CHOL 173 05/20/2020   HDL 51 05/20/2020   LDLCALC 108 (H) 05/20/2020   TRIG 77 05/20/2020   CHOLHDL 3.4 05/20/2020   Lab Results  Component Value Date   NA 139 05/20/2020   K 4.4 05/20/2020   CREATININE 0.96 05/20/2020   GFRNONAA 80 05/20/2020   GFRAA 93 05/20/2020   GLUCOSE 204 (H) 05/20/2020     ---------------------------------------------------------------------------------------------------  Hypertension, follow-up  BP Readings from Last 3 Encounters:  01/25/21 135/79  09/22/20 129/84  05/20/20 125/72   Wt Readings from Last 3 Encounters:  01/25/21 136 lb (61.7 kg)  09/22/20 133 lb (60.3 kg)  05/20/20 127 lb 9.6 oz (57.9 kg)     He was last seen for hypertension 8 months ago.   BP at that visit was 125/72. Management since that visit includes continue same medications.  He reports good compliance with treatment. He is not having side effects.  He is following a Regular diet. He is exercising. He does not smoke.  Use of agents associated with hypertension: NSAIDS.   Marland Kitchen Symptoms: No chest pain No chest pressure  No palpitations No syncope  No dyspnea No orthopnea  No paroxysmal nocturnal dyspnea No lower extremity edema   Pertinent labs: Lab Results  Component Value Date   CHOL 173 05/20/2020   HDL 51 05/20/2020   LDLCALC 108 (H) 05/20/2020   TRIG 77 05/20/2020   CHOLHDL 3.4 05/20/2020   Lab Results  Component Value Date   NA 139 05/20/2020   K 4.4 05/20/2020   CREATININE 0.96 05/20/2020   GFRNONAA 80 05/20/2020   GFRAA 93 05/20/2020   GLUCOSE 204 (H) 05/20/2020     The 10-year ASCVD risk score Mikey Bussing DC Jr., et al., 2013) is: 36.6%   ---------------------------------------------------------------------------------------------------     Medications: Outpatient Medications Prior to Visit  Medication Sig  . aspirin 81 MG tablet Take 81 mg by mouth daily.  . betamethasone dipropionate (DIPROLENE) 0.05 % cream Apply topically daily. (Patient taking differently: Apply topically daily. As needed for psoriasis flare ups)  . Blood Glucose Monitoring Suppl (ONE TOUCH ULTRA 2) w/Device KIT Use to check blood sugar daily for type  2 diabetes  . cyanocobalamin (,VITAMIN B-12,) 1000 MCG/ML injection INJECT 1 MLS MONTHLY AS DIRECTED  . ferrous sulfate 325 (65 FE) MG tablet Take 325 mg by mouth daily.   Marland Kitchen glipiZIDE (GLUCOTROL XL) 2.5 MG 24 hr tablet TAKE 1 TABLET (2.5 MG TOTAL) BY MOUTH DAILY WITH BREAKFAST.  Marland Kitchen JANUVIA 100 MG tablet TAKE 1 TABLET BY MOUTH EVERY DAY  . lisinopril-hydrochlorothiazide (ZESTORETIC) 10-12.5 MG tablet TAKE 1 TABLET BY MOUTH EVERY DAY  . meloxicam (MOBIC) 15 MG tablet Take 1 tablet (15 mg total) by mouth daily as needed.  .  metFORMIN (GLUCOPHAGE) 1000 MG tablet TAKE 1 TABLET BY MOUTH TWICE A DAY  . Multiple Vitamin tablet Take 1 tablet by mouth daily.  [name] test strip USE 1 STRIP TO CHECK BLOOD SUGAR ONCE DAILY  . pramipexole (MIRAPEX) 0.5 MG tablet Take 1 tablet (0.5 mg total) by mouth at bedtime.  . rosuvastatin (CRESTOR) 5 MG tablet Take 1 tablet (5 mg total) by mouth daily.  . [DISCONTINUED] rOPINIRole (REQUIP) 2 MG tablet Take 2 mg by mouth at bedtime. (Patient not taking: Reported on 01/25/2021)   No facility-administered medications prior to visit.    Review of Systems  Constitutional: Negative for appetite change, chills and fever.  Respiratory: Negative for chest tightness, shortness of breath and wheezing.   Cardiovascular: Negative for chest pain and palpitations.  Gastrointestinal: Negative for abdominal pain, nausea and vomiting.       Objective    BP 135/79 (BP Location: Left Arm, Patient Position: Sitting, Cuff Size: Normal)   Pulse 60   Temp 98.8 F (37.1 C) (Temporal)   Resp 16   Wt 136 lb (61.7 kg)   BMI 23.34 kg/m     Physical Exam   General appearance: Well developed, well nourished male, cooperative and in no acute distress HePsych: Appropriate mood and affect. Neurologic: Mental status: Alert, oriented to person, place, and time, thought content appropriate.   Results for orders placed or performed in visit on 01/25/21  POCT HgB A1C  Result Value Ref Range   Hemoglobin A1C 6.3 (A) 4.0 - 5.6 %   Est. average glucose Bld gHb Est-mCnc 134     Assessment & Plan     1. Controlled type 2 diabetes mellitus without complication, without long-term current use of insulin ([place]) Very well controlled. Continue current medications.    2. Other allergic rhinitis refill- fluticasone (FLONASE) 50 MCG/ACT nasal spray; Place 2 sprays into both nostrils daily.  Dispense: 16 g; Refill: 2  3. Colon cancer screening  - Ambulatory referral to gastroenterology for  colonoscopy  4. Essential (primary) hypertension Well controlled.  Continue current medications.    Follow up annual CPE end of July, 2022        The entirety of the information documented in the History of Present Illness, Review of Systems and Physical Exam were personally obtained by me. Portions of this information were initially documented by the CMA and reviewed by me for thoroughness and accuracy.      Lelon Huh, MD  [facility] [phone] (phone) [phone] (fax)  [place]

## 2021-01-27 ENCOUNTER — Other Ambulatory Visit: Payer: Self-pay | Admitting: Family Medicine

## 2021-01-27 DIAGNOSIS — I10 Essential (primary) hypertension: Secondary | ICD-10-CM

## 2021-02-02 ENCOUNTER — Ambulatory Visit (INDEPENDENT_AMBULATORY_CARE_PROVIDER_SITE_OTHER): Payer: PPO

## 2021-02-02 DIAGNOSIS — I10 Essential (primary) hypertension: Secondary | ICD-10-CM | POA: Diagnosis not present

## 2021-02-02 DIAGNOSIS — E119 Type 2 diabetes mellitus without complications: Secondary | ICD-10-CM

## 2021-02-02 NOTE — Chronic Care Management (AMB) (Signed)
Chronic Care Management   Follow Up Note   02/02/2021 Name: Jeremiah Meyer MRN: [id number] DOB: 1951-09-18  Primary Care Provider: Birdie Sons, MD Reason for referral : Chronic Care Management   Jeremiah Meyer is a 70 y.o. year old male who is a primary care patient of Fisher, Kirstie Peri, MD.   Review of Jeremiah Meyer status, including review of consultants reports, relevant labs and test results was conducted today. Collaboration with appropriate care team members was performed as part of the comprehensive evaluation and provision of chronic care management services.    SDOH (Social Determinants of Health) assessments performed: No    Outpatient Encounter Medications as of 02/02/2021  Medication Sig Note  . aspirin 81 MG tablet Take 81 mg by mouth daily.   . betamethasone dipropionate (DIPROLENE) 0.05 % cream Apply topically daily. (Patient taking differently: Apply topically daily. As needed for psoriasis flare ups)   . Blood Glucose Monitoring Suppl (ONE TOUCH ULTRA 2) w/Device KIT Use to check blood sugar daily for type 2 diabetes   . cyanocobalamin (,VITAMIN B-12,) 1000 MCG/ML injection INJECT 1 MLS MONTHLY AS DIRECTED   . ferrous sulfate 325 (65 FE) MG tablet Take 325 mg by mouth daily.    . fluticasone (FLONASE) 50 MCG/ACT nasal spray Place 2 sprays into both nostrils daily.   Marland Kitchen glipiZIDE (GLUCOTROL XL) 2.5 MG 24 hr tablet TAKE 1 TABLET BY MOUTH DAILY WITH BREAKFAST.   Marland Kitchen JANUVIA 100 MG tablet TAKE 1 TABLET BY MOUTH EVERY DAY   . lisinopril-hydrochlorothiazide (ZESTORETIC) 10-12.5 MG tablet TAKE 1 TABLET BY MOUTH EVERY DAY   . meloxicam (MOBIC) 15 MG tablet Take 1 tablet (15 mg total) by mouth daily as needed. 09/02/2019: As needed   . metFORMIN (GLUCOPHAGE) 1000 MG tablet TAKE 1 TABLET BY MOUTH TWICE A DAY   . Multiple Vitamin tablet Take 1 tablet by mouth daily.   [name] test strip USE 1 STRIP TO CHECK BLOOD SUGAR ONCE DAILY   . pramipexole (MIRAPEX) 0.5 MG  tablet Take 1 tablet (0.5 mg total) by mouth at bedtime.   . rosuvastatin (CRESTOR) 5 MG tablet Take 1 tablet (5 mg total) by mouth daily.    No facility-administered encounter medications on file as of 02/02/2021.     Objective:  Patient Care Plan: Diabetes Type 2 (Adult)    Problem Identified: Disease Progression (Diabetes, Type 2)     Long-Range Goal: Disease Progression Prevented or Minimized   Start Date: 10/27/2020  Expected End Date: 02/24/2021  Priority: High  Note:   Objective:  Lab Results  Component Value Date   HGBA1C 6.3 (A) 01/25/2021    Lab Results  Component Value Date   CREATININE 0.96 05/20/2020      Interventions:  . Collaboration with Jeremiah Sons, MD regarding development and update of comprehensive plan of care as evidenced by provider attestation and co-signature . Inter-disciplinary care team collaboration (see longitudinal plan of care) . Reviewed medications. Advised to continue taking medications as prescribed. Encouraged to notify team with concerns regarding prescription cost. . Provided information regarding importance of consistent blood glucose monitoring. Encouraged to monitor and maintain a log. . Reviewed s/sx of hypoglycemia and hyperglycemia along with recommended interventions. . Reviewed current plan for Diabetes self-management. Reports managing well and attempting to adhere to recommended treatment recommendations. A1C slightly decreased from 6.5. to 6.3%. Declined need for additional resources.    Patient Goals/Self-Care Activities - Continue to self administer medications as  prescribed - Attend all scheduled provider appointments - Monitor blood glucose levels and utilize recommended treatment interventions  - Continue adherence to recommended cardiac prudent, diabetic diet.  - Notify provider or care management team with questions and new concerns as needed   Follow Up Plan: Will follow in two months.   Patient Care Plan:  Hypertension (Adult)    Problem Identified: Hypertension (Hypertension)     Long-Range Goal: Hypertension Monitored   Start Date: 10/27/2020  Expected End Date: 02/24/2021  Priority: High  Note:   Objective: BP Readings from Last 3 Encounters:  01/25/21 135/79  09/22/20 129/84  05/20/20 125/72     Interventions:  . Collaboration with Jeremiah Sons, MD regarding development and update of comprehensive plan of care as evidenced by provider attestation and co-signature . Inter-disciplinary care team collaboration (see longitudinal plan of care) . Discussed current plan for Hypertension self management. Reports compliance with treatment recommendations and taking medications as prescribed. BP readings have been within the established range. No concerning symptoms over the past few months. No changes in activity tolerance or ability to perform daily tasks. Encouraged to continue taking medications as prescribed and notify provider if unable to tolerate current regimen.   Patient Goals/Self-Care Activities: - Continue self administering medications as prescribed - Attend all scheduled provider appointments - Monitor and record BP readings. - Continue adherence with recommended diet - Contact provider or care management team with questions or new concerns.    Goal Met       PLAN A member of the care management team will follow up in two months.    Cristy [facility]lander [facility] Care Management [facility] [phone]

## 2021-02-04 NOTE — Patient Instructions (Signed)
Thank you for allowing the Chronic Care Management team to participate in your care. It was a pleasure speaking with you. Please feel free to contact me with questions.   Goals Addressed: Patient Care Plan: Diabetes Type 2 (Adult)    Problem Identified: Disease Progression (Diabetes, Type 2)     Long-Range Goal: Disease Progression Prevented or Minimized   Start Date: 10/27/2020  Expected End Date: 02/24/2021  Priority: High  Note:   Objective:  Lab Results  Component Value Date   HGBA1C 6.3 (A) 01/25/2021    Lab Results  Component Value Date   CREATININE 0.96 05/20/2020      Interventions:  . Collaboration with Birdie Sons, MD regarding development and update of comprehensive plan of care as evidenced by provider attestation and co-signature . Inter-disciplinary care team collaboration (see longitudinal plan of care) . Reviewed medications. Advised to continue taking medications as prescribed. Encouraged to notify team with concerns regarding prescription cost. . Provided information regarding importance of consistent blood glucose monitoring. Encouraged to monitor and maintain a log. . Reviewed s/sx of hypoglycemia and hyperglycemia along with recommended interventions. . Reviewed current plan for Diabetes self-management. Reports managing well and attempting to adhere to recommended treatment recommendations. A1C slightly decreased from 6.5. to 6.3%. Declined need for additional resources.    Patient Goals/Self-Care Activities - Continue to self administer medications as prescribed - Attend all scheduled provider appointments - Monitor blood glucose levels and utilize recommended treatment interventions  - Continue adherence to recommended cardiac prudent, diabetic diet.  - Notify provider or care management team with questions and new concerns as needed   Follow Up Plan: Will follow in two months.   Patient Care Plan: Hypertension (Adult)    Problem Identified:  Hypertension (Hypertension)     Long-Range Goal: Hypertension Monitored   Start Date: 10/27/2020  Expected End Date: 02/24/2021  Priority: High  Note:   Objective: BP Readings from Last 3 Encounters:  01/25/21 135/79  09/22/20 129/84  05/20/20 125/72     Interventions:  . Collaboration with Birdie Sons, MD regarding development and update of comprehensive plan of care as evidenced by provider attestation and co-signature . Inter-disciplinary care team collaboration (see longitudinal plan of care) . Discussed current plan for Hypertension self management. Reports compliance with treatment recommendations and taking medications as prescribed. BP readings have been within the established range. No concerning symptoms over the past few months. No changes in activity tolerance or ability to perform daily tasks. Encouraged to continue taking medications as prescribed and notify provider if unable to tolerate current regimen.   Patient Goals/Self-Care Activities: - Continue self administering medications as prescribed - Attend all scheduled provider appointments - Monitor and record BP readings. - Continue adherence with recommended diet - Contact provider or care management team with questions or new concerns.    Goal Met         Jeremiah Meyer verbalized understanding of the information discussed during the telephonic outreach today. Declined need for mailed/printed instructions. A member of the care management team will follow up in two months.    Jeremiah Meyer [facility] [phone]

## 2021-02-09 ENCOUNTER — Other Ambulatory Visit: Payer: Self-pay

## 2021-02-09 ENCOUNTER — Telehealth (INDEPENDENT_AMBULATORY_CARE_PROVIDER_SITE_OTHER): Payer: Self-pay | Admitting: Gastroenterology

## 2021-02-09 DIAGNOSIS — Z1211 Encounter for screening for malignant neoplasm of colon: Secondary | ICD-10-CM

## 2021-02-09 MED ORDER — NA SULFATE-K SULFATE-MG SULF 17.5-3.13-1.6 GM/177ML PO SOLN: 1.0000 | Freq: Once | ORAL | 0 refills | Status: AC

## 2021-02-09 NOTE — Progress Notes (Signed)
Gastroenterology Pre-Procedure Review  Request Date: 02/17/21 Requesting Physician: Dr. Bonna Gains  PATIENT REVIEW QUESTIONS: The patient responded to the following health history questions as indicated:    1. Are you having any GI issues? no 2. Do you have a personal history of Polyps? no 3. Do you have a family history of Colon Cancer or Polyps? brother gastric cancer 4. Diabetes Mellitus? yes 5. Joint replacements in the past 12 months?no 6. Major health problems in the past 3 months?no 7. Any artificial heart valves, MVP, or defibrillator?no    MEDICATIONS & ALLERGIES:    Patient reports the following regarding taking any anticoagulation/antiplatelet therapy:   Plavix, Coumadin, Eliquis, Xarelto, Lovenox, Pradaxa, Brilinta, or Effient? no Aspirin? Yes 81 mg daily  Patient confirms/reports the following medications:  Current Outpatient Medications  Medication Sig Dispense Refill  . aspirin 81 MG tablet Take 81 mg by mouth daily.    . betamethasone dipropionate (DIPROLENE) 0.05 % cream Apply topically daily. (Patient taking differently: Apply topically daily. As needed for psoriasis flare ups) 45 g 1  . Blood Glucose Monitoring Suppl (ONE TOUCH ULTRA 2) w/Device KIT Use to check blood sugar daily for type 2 diabetes 1 each 1  . cyanocobalamin (,VITAMIN B-12,) 1000 MCG/ML injection INJECT 1 MLS MONTHLY AS DIRECTED 3 mL 4  . ferrous sulfate 325 (65 FE) MG tablet Take 325 mg by mouth daily.     . fluticasone (FLONASE) 50 MCG/ACT nasal spray Place 2 sprays into both nostrils daily. 16 g 2  . glipiZIDE (GLUCOTROL XL) 2.5 MG 24 hr tablet TAKE 1 TABLET BY MOUTH DAILY WITH BREAKFAST. 90 tablet 0  . JANUVIA 100 MG tablet TAKE 1 TABLET BY MOUTH EVERY DAY 90 tablet 0  . lisinopril-hydrochlorothiazide (ZESTORETIC) 10-12.5 MG tablet TAKE 1 TABLET BY MOUTH EVERY DAY 90 tablet 0  . meloxicam (MOBIC) 15 MG tablet Take 1 tablet (15 mg total) by mouth daily as needed. 90 tablet 1  . metFORMIN  (GLUCOPHAGE) 1000 MG tablet TAKE 1 TABLET BY MOUTH TWICE A DAY 180 tablet 4  . Multiple Vitamin tablet Take 1 tablet by mouth daily.    [name] test strip USE 1 STRIP TO CHECK BLOOD SUGAR ONCE DAILY 100 strip 4  . pramipexole (MIRAPEX) 0.5 MG tablet Take 1 tablet (0.5 mg total) by mouth at bedtime. 90 tablet 3  . rosuvastatin (CRESTOR) 5 MG tablet Take 1 tablet (5 mg total) by mouth daily. 90 tablet 4   No current facility-administered medications for this visit.    Patient confirms/reports the following allergies:  Allergies  Allergen Reactions  . Simvastatin     Back and leg pain    No orders of the defined types were placed in this encounter.   AUTHORIZATION INFORMATION Primary Insurance: 1D#: Group #:  Secondary Insurance: 1D#: Group #:  SCHEDULE INFORMATION: Date: 02/17/21 Time: Location:[facility]

## 2021-02-17 ENCOUNTER — Ambulatory Visit: Payer: PPO | Admitting: Anesthesiology

## 2021-02-17 ENCOUNTER — Encounter: Payer: Self-pay | Admitting: Gastroenterology

## 2021-02-17 ENCOUNTER — Ambulatory Visit
Admission: RE | Admit: 2021-02-17 | Discharge: 2021-02-17 | Disposition: A | Discharge status: Home / Self Care | Payer: PPO | Source: Ambulatory Visit | Attending: Gastroenterology | Admitting: Gastroenterology

## 2021-02-17 ENCOUNTER — Encounter
Admission: RE | Disposition: A | Discharge status: Home / Self Care | Payer: Self-pay | Source: Ambulatory Visit | Attending: Gastroenterology

## 2021-02-17 ENCOUNTER — Other Ambulatory Visit: Payer: Self-pay

## 2021-02-17 DIAGNOSIS — Z87891 Personal history of nicotine dependence: Secondary | ICD-10-CM | POA: Diagnosis not present

## 2021-02-17 DIAGNOSIS — Z79899 Other long term (current) drug therapy: Secondary | ICD-10-CM | POA: Diagnosis not present

## 2021-02-17 DIAGNOSIS — Z7984 Long term (current) use of oral hypoglycemic drugs: Secondary | ICD-10-CM | POA: Insufficient documentation

## 2021-02-17 DIAGNOSIS — Z1211 Encounter for screening for malignant neoplasm of colon: Secondary | ICD-10-CM | POA: Diagnosis not present

## 2021-02-17 DIAGNOSIS — Z7982 Long term (current) use of aspirin: Secondary | ICD-10-CM | POA: Diagnosis not present

## 2021-02-17 DIAGNOSIS — Z791 Long term (current) use of non-steroidal anti-inflammatories (NSAID): Secondary | ICD-10-CM | POA: Insufficient documentation

## 2021-02-17 HISTORY — PX: COLONOSCOPY WITH PROPOFOL: SHX5780

## 2021-02-17 LAB — GLUCOSE, CAPILLARY: Glucose-Capillary: 151 mg/dL — ABNORMAL HIGH (ref 70–99)

## 2021-02-17 SURGERY — COLONOSCOPY WITH PROPOFOL
Anesthesia: General

## 2021-02-17 MED ORDER — LIDOCAINE HCL (CARDIAC) PF 100 MG/5ML IV SOSY
PREFILLED_SYRINGE | INTRAVENOUS | Status: DC | PRN
  Administered 2021-02-17: 80 mg via INTRAVENOUS

## 2021-02-17 MED ORDER — PROPOFOL 500 MG/50ML IV EMUL
INTRAVENOUS | Status: DC | PRN
  Administered 2021-02-17: 180 ug/kg/min via INTRAVENOUS

## 2021-02-17 MED ORDER — SODIUM CHLORIDE 0.9 % IV SOLN: INTRAVENOUS | Status: DC

## 2021-02-17 MED ORDER — PROPOFOL 10 MG/ML IV BOLUS
INTRAVENOUS | Status: DC | PRN
  Administered 2021-02-17: 50 mg via INTRAVENOUS

## 2021-02-17 MED ORDER — PROPOFOL 500 MG/50ML IV EMUL
INTRAVENOUS | Status: AC
  Filled 2021-02-17: qty 50

## 2021-02-17 MED ORDER — PROPOFOL 10 MG/ML IV BOLUS
INTRAVENOUS | Status: AC
  Filled 2021-02-17: qty 20

## 2021-02-17 NOTE — Anesthesia Preprocedure Evaluation (Signed)
Anesthesia Evaluation  Patient identified by MRN, date of birth, ID band Patient awake    Reviewed: Allergy & Precautions, H&P , NPO status , Patient's Chart, lab work & pertinent test results  Airway Mallampati: III  TM Distance: <3 FB Neck ROM: limited    Dental  (+) Chipped   Pulmonary neg shortness of breath, former smoker,    Pulmonary exam normal        Cardiovascular Exercise Tolerance: Good hypertension, (-) angina(-) Past MI and (-) DOE Normal cardiovascular exam     Neuro/Psych  Neuromuscular disease negative psych ROS   GI/Hepatic negative GI ROS, Neg liver ROS, neg GERD  ,  Endo/Other  diabetes, Type 2  Renal/GU negative Renal ROS  negative genitourinary   Musculoskeletal   Abdominal   Peds  Hematology negative hematology ROS (+)   Anesthesia Other Findings Past Medical History: No date: Anemia 06/12/2015: Cyst, pilonidal, with abscess No date: Diabetes mellitus without complication (HCC)     Comment:  type 2 06/12/2015: Hemorrhoid No date: Hip strain No date: Hyperlipidemia No date: Hypertension  Past Surgical History: 1985: LUNG SURGERY; Left     Comment:  S/P RECURRENT PNEUMOTHORAX 1981: VASECTOMY  BMI    Body Mass Index: 20.94 kg/m      Reproductive/Obstetrics negative OB ROS                             Anesthesia Physical Anesthesia Plan  ASA: III  Anesthesia Plan: General   Post-op Pain Management:    Induction: Intravenous  PONV Risk Score and Plan: Propofol infusion and TIVA  Airway Management Planned: Natural Airway and Nasal Cannula  Additional Equipment:   Intra-op Plan:   Post-operative Plan:   Informed Consent: I have reviewed the patients History and Physical, chart, labs and discussed the procedure including the risks, benefits and alternatives for the proposed anesthesia with the patient or authorized representative who has indicated  his/her understanding and acceptance.     Dental Advisory Given  Plan Discussed with: Anesthesiologist, CRNA and Surgeon  Anesthesia Plan Comments: (Patient consented for risks of anesthesia including but not limited to:  - adverse reactions to medications - risk of airway placement if required - damage to eyes, teeth, lips or other oral mucosa - nerve damage due to positioning  - sore throat or hoarseness - Damage to heart, brain, nerves, lungs, other parts of body or loss of life  Patient voiced understanding.)        Anesthesia Quick Evaluation

## 2021-02-17 NOTE — Op Note (Signed)
[facility] Gastroenterology Patient Name: Jeremiah Meyer Procedure Date: 02/17/2021 9:12 AM MRN: [id number] Account #: [id number] Date of Birth: [birth year] Admit Type: Outpatient Age: 70 Room: [facility] ENDO ROOM 3 Gender: Male Note Status: Finalized Procedure:             Colonoscopy Indications:           Screening for colorectal malignant neoplasm Providers:             Ailana Cuadrado B. Maximino Greenland MD, MD Referring MD:          Demetrios Isaacs. Sherrie Mustache, MD (Referring MD) Medicines:             Monitored Anesthesia Care Complications:         No immediate complications. Procedure:             Pre-Anesthesia Assessment:                        - Prior to the procedure, a History and Physical was                         performed, and patient medications, allergies and                         sensitivities were reviewed. The patient's tolerance                         of previous anesthesia was reviewed.                        - The risks and benefits of the procedure and the                         sedation options and risks were discussed with the                         patient. All questions were answered and informed                         consent was obtained.                        - Patient identification and proposed procedure were                         verified prior to the procedure by the physician, the                         nurse, the anesthetist and the technician. The                         procedure was verified in the pre-procedure area in                         the procedure room in the endoscopy suite.                        - ASA Grade Assessment: II - A patient with mild  systemic disease.                        - After reviewing the risks and benefits, the patient                         was deemed in satisfactory condition to undergo the                         procedure.                        After obtaining informed consent, the  colonoscope was                         passed under direct vision. Throughout the procedure,                         the patient's blood pressure, pulse, and oxygen                         saturations were monitored continuously. The                         Colonoscope was introduced through the anus and                         advanced to the the cecum, identified by appendiceal                         orifice and ileocecal valve. The colonoscopy was                         performed with ease. The patient tolerated the                         procedure well. The quality of the bowel preparation                         was good. Findings:      The perianal and digital rectal examinations were normal.      The rectum, sigmoid colon, descending colon, transverse colon, ascending       colon and cecum appeared normal.      The retroflexed view of the distal rectum and anal verge was normal and       showed no anal or rectal abnormalities. Impression:            - The rectum, sigmoid colon, descending colon,                         transverse colon, ascending colon and cecum are normal.                        - The distal rectum and anal verge are normal on                         retroflexion view.                        - No  specimens collected. Recommendation:        - Discharge patient to home.                        - Resume previous diet.                        - Continue present medications.                        - Repeat colonoscopy in 10 years for screening                         purposes.                        - Return to primary care physician as previously                         scheduled.                        - The findings and recommendations were discussed with                         the patient.                        - The findings and recommendations were discussed with                         the patient's family.                        - In the future, if  patient develops new symptoms such                         as blood per rectum, abdominal pain, weight loss,                         altered bowel habits or any other reason for concern,                         patient should discuss this with thier PCP as they may                         need a GI referral at that time or evaluation for need                         for colonoscopy earlier than the recommended screening                         colonoscopy.                        In addition, if patient's family history of colon                         cancer changes (no family history at this time) in the  future, earlier screening may be indicated and patient                         should discuss this with PCP as well. Procedure Code(s):     --- Professional ---                        [phone], Colonoscopy, flexible; diagnostic, including                         collection of specimen(s) by brushing or washing, when                         performed (separate procedure) Diagnosis Code(s):     --- Professional ---                        Z12.11, Encounter for screening for malignant neoplasm                         of colon CPT copyright 2019 American Medical Association. All rights reserved. The codes documented in this report are preliminary and upon coder review may  be revised to meet current compliance requirements.  Melodie Bouillon, MD Michel Bickers B. Maximino Greenland MD, MD 02/17/2021 9:45:23 AM This report has been signed electronically. Number of Addenda: 0 Note Initiated On: 02/17/2021 9:12 AM Scope Withdrawal Time: 0 hours 12 minutes 55 seconds  Total Procedure Duration: 0 hours 20 minutes 32 seconds       [facility]

## 2021-02-17 NOTE — Transfer of Care (Signed)
Immediate Anesthesia Transfer of Care Note  Patient: Jeremiah Meyer  Procedure(s) Performed: COLONOSCOPY WITH PROPOFOL (N/A )  Patient Location: PACU  Anesthesia Type:General  Level of Consciousness: sedated  Airway & Oxygen Therapy: Patient Spontanous Breathing and Patient connected to nasal cannula oxygen  Post-op Assessment: Report given to RN and Post -op Vital signs reviewed and stable  Post vital signs: Reviewed and stable  Last Vitals:  Vitals Value Taken Time  BP    Temp    Pulse    Resp    SpO2      Last Pain:  Vitals:   02/17/21 0940  TempSrc: Temporal  PainSc:          Complications: No complications documented.

## 2021-02-17 NOTE — Anesthesia Postprocedure Evaluation (Signed)
Anesthesia Post Note  Patient: Jeremiah Meyer  Procedure(s) Performed: COLONOSCOPY WITH PROPOFOL (N/A )  Patient location during evaluation: Endoscopy Anesthesia Type: General Level of consciousness: awake and alert Pain management: pain level controlled Vital Signs Assessment: post-procedure vital signs reviewed and stable Respiratory status: spontaneous breathing, nonlabored ventilation, respiratory function stable and patient connected to nasal cannula oxygen Cardiovascular status: blood pressure returned to baseline and stable Postop Assessment: no apparent nausea or vomiting Anesthetic complications: no   No complications documented.   Last Vitals:  Vitals:   02/17/21 1000 02/17/21 1010  BP: 116/73 116/77  Pulse:  (!) 56  Resp: (!) 22 14  Temp:    SpO2: 97% 100%    Last Pain:  Vitals:   02/17/21 0940  TempSrc: Temporal  PainSc:                  Cleda Mccreedy Mabel Unrein

## 2021-02-17 NOTE — H&P (Signed)
Vonda Antigua, MD [address] [place], [place], [place], [address] [address], [place], [place], [place], [address] Phone: [phone]  Fax: [phone]  Primary Care Physician:  Birdie Sons, MD   Pre-Procedure History & Physical: HPI:  Jeremiah Meyer is a 70 y.o. male is here for a colonoscopy.   Past Medical History:  Diagnosis Date  . Anemia   . Cyst, pilonidal, with abscess 06/12/2015  . Diabetes mellitus without complication ([place])    type 2  . Hemorrhoid 06/12/2015  . Hip strain   . Hyperlipidemia   . Hypertension     Past Surgical History:  Procedure Laterality Date  . LUNG SURGERY Left 1985   S/P [place]    Prior to Admission medications   Medication Sig Start Date End Date Taking? Authorizing Provider  cyanocobalamin (,VITAMIN B-12,) 1000 MCG/ML injection INJECT 1 MLS MONTHLY AS DIRECTED 10/15/20  Yes Birdie Sons, MD  ferrous sulfate 325 (65 FE) MG tablet Take 325 mg by mouth daily.    Yes [provider]  fluticasone (FLONASE) 50 MCG/ACT nasal spray Place 2 sprays into both nostrils daily. 01/25/21  Yes Birdie Sons, MD  glipiZIDE (GLUCOTROL XL) 2.5 MG 24 hr tablet TAKE 1 TABLET BY MOUTH DAILY WITH BREAKFAST. 01/27/21  Yes Birdie Sons, MD  JANUVIA 100 MG tablet TAKE 1 TABLET BY MOUTH EVERY DAY 01/20/21  Yes Birdie Sons, MD  lisinopril-hydrochlorothiazide (ZESTORETIC) 10-12.5 MG tablet TAKE 1 TABLET BY MOUTH EVERY DAY 01/27/21  Yes Birdie Sons, MD  meloxicam (MOBIC) 15 MG tablet Take 1 tablet (15 mg total) by mouth daily as needed. 01/11/18  Yes Birdie Sons, MD  metFORMIN (GLUCOPHAGE) 1000 MG tablet TAKE 1 TABLET BY MOUTH TWICE A DAY 03/29/20  Yes Birdie Sons, MD  Multiple Vitamin tablet Take 1 tablet by mouth daily.   Yes [provider]  pramipexole (MIRAPEX) 0.5 MG tablet Take 1 tablet (0.5 mg total) by mouth at bedtime. 08/21/20  Yes Birdie Sons, MD  rosuvastatin  (CRESTOR) 5 MG tablet Take 1 tablet (5 mg total) by mouth daily. 06/22/20  Yes Birdie Sons, MD  aspirin 81 MG tablet Take 81 mg by mouth daily.    [provider]  betamethasone dipropionate (DIPROLENE) 0.05 % cream Apply topically daily. Patient taking differently: Apply topically daily. As needed for psoriasis flare ups 07/05/17   Birdie Sons, MD  Blood Glucose Monitoring Suppl (ONE TOUCH ULTRA 2) w/Device KIT Use to check blood sugar daily for type 2 diabetes 08/01/18   Birdie Sons, MD  [facility] ULTRA test strip USE 1 STRIP TO CHECK BLOOD SUGAR ONCE DAILY 10/09/19   Birdie Sons, MD    Allergies as of 02/09/2021 - Review Complete 02/09/2021  Allergen Reaction Noted  . Simvastatin  05/20/2020    Family History  Problem Relation Age of Onset  . Cancer Father        liver  . Cancer Brother        stomach and intestine  . Diabetes Brother   . Crohn's disease Brother     Social History   Socioeconomic History  . Marital status: Married    Spouse name: Raiford Noble  . Number of children: 1  . Years of education: Not on file  . Highest education level: Associate degree: occupational, Hotel manager, or vocational program  Occupational History  . 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 Smoking status: Former Smoker    Types: Cigarettes    Quit date: 10/24/1984    Years since quitting: 36.3  . Smokeless tobacco: Never Used  . Tobacco comment: Quit 1986  Vaping Use  . Vaping Use: Never used  Substance and Sexual Activity  . Alcohol use: Yes    Comment: beer 1-2 monthly  . Drug use: Yes    Types: Marijuana  . Sexual activity: Not on file  Other Topics Concern  . Not on file  Social History Narrative   Has one son who lives in [place]   Social Determinants of Health   Financial Resource Strain: [place]   . Difficulty of Paying Living Expenses: Not very hard  Food Insecurity: No Food Insecurity  . Worried  About Charity fundraiser in the Last Year: Never true  . Ran Out of Food in the Last Year: Never true  Transportation Needs: No Transportation Needs  . Lack of Transportation (Medical): No  . Lack of Transportation (Non-Medical): No  Physical Activity: Inactive  . Days of Exercise per Week: 0 days  . Minutes of Exercise per Session: 0 min  Stress: No Stress Concern Present  . Feeling of Stress : Not at all  Social Connections: Moderately Isolated  . Frequency of Communication with Friends and Family: Twice a week  . Frequency of Social Gatherings with Friends and Family: Once a week  . Attends Religious Services: Never  . Active Member of Clubs or Organizations: No  . Attends Archivist Meetings: Never  . Marital Status: Married  Human resources officer Violence: Not At Risk  . Fear of Current or Ex-Partner: No  . Emotionally Abused: No  . Physically Abused: No  . Sexually Abused: No    Review of Systems: See HPI, otherwise negative ROS  Physical Exam: BP 140/88   Pulse 66   Temp 98.4 F (36.9 C) (Temporal)   Resp 15   Ht _0  (1.626 m)   Wt 55.3 kg   SpO2 100%   BMI 20.94 kg/m  General:   Alert,  pleasant and cooperative in NAD Head:  Normocephalic and atraumatic. Neck:  Supple; no masses or thyromegaly. Lungs:  Clear throughout to auscultation, normal respiratory effort.    Heart:  +S1, +S2, Regular rate and rhythm, No edema. Abdomen:  Soft, nontender and nondistended. Normal bowel sounds, without guarding, and without rebound.   Neurologic:  Alert and  oriented x4;  grossly normal neurologically.  Impression/Plan: Jeremiah Meyer is here for a colonoscopy to be performed for average risk screening.  Risks, benefits, limitations, and alternatives regarding  colonoscopy have been reviewed with the patient.  Questions have been answered.  All parties agreeable.   Virgel Manifold, MD  02/17/2021, 9:05 AM

## 2021-02-18 ENCOUNTER — Encounter: Payer: Self-pay | Admitting: Gastroenterology

## 2021-02-26 DIAGNOSIS — H5203 Hypermetropia, bilateral: Secondary | ICD-10-CM | POA: Diagnosis not present

## 2021-02-26 DIAGNOSIS — E119 Type 2 diabetes mellitus without complications: Secondary | ICD-10-CM | POA: Diagnosis not present

## 2021-02-26 DIAGNOSIS — H2513 Age-related nuclear cataract, bilateral: Secondary | ICD-10-CM | POA: Diagnosis not present

## 2021-02-26 DIAGNOSIS — H25013 Cortical age-related cataract, bilateral: Secondary | ICD-10-CM | POA: Diagnosis not present

## 2021-02-26 LAB — HM DIABETES EYE EXAM

## 2021-04-13 ENCOUNTER — Ambulatory Visit (INDEPENDENT_AMBULATORY_CARE_PROVIDER_SITE_OTHER): Payer: PPO

## 2021-04-13 DIAGNOSIS — I10 Essential (primary) hypertension: Secondary | ICD-10-CM

## 2021-04-13 DIAGNOSIS — E119 Type 2 diabetes mellitus without complications: Secondary | ICD-10-CM | POA: Diagnosis not present

## 2021-04-13 NOTE — Chronic Care Management (AMB) (Signed)
Chronic Care Management   Follow Up Note   04/13/2021 Name: Jeremiah Meyer MRN: [id number] DOB: 03-17-1951  Primary Care Provider: Birdie Sons, MD Reason for referral : Chronic Care Management   Jeremiah Meyer is a 70 y.o. year old male who is a primary care patient of Fisher, Kirstie Peri, MD. A routine telephonic outreach was conducted today. He has met his chronic care management goals.  Review of Jeremiah Meyer status, including review of consultants reports, relevant labs and test results was conducted today. Collaboration with appropriate care team members was performed as part of the comprehensive evaluation and provision of chronic care management services.     SDOH (Social Determinants of Health) assessments performed: No    Outpatient Encounter Medications as of 04/13/2021  Medication Sig Note   aspirin 81 MG tablet Take 81 mg by mouth daily.    betamethasone dipropionate (DIPROLENE) 0.05 % cream Apply topically daily. (Patient taking differently: Apply topically daily. As needed for psoriasis flare ups)    Blood Glucose Monitoring Suppl (ONE TOUCH ULTRA 2) w/Device KIT Use to check blood sugar daily for type 2 diabetes    cyanocobalamin (,VITAMIN B-12,) 1000 MCG/ML injection INJECT 1 MLS MONTHLY AS DIRECTED    ferrous sulfate 325 (65 FE) MG tablet Take 325 mg by mouth daily.     fluticasone (FLONASE) 50 MCG/ACT nasal spray Place 2 sprays into both nostrils daily.    glipiZIDE (GLUCOTROL XL) 2.5 MG 24 hr tablet TAKE 1 TABLET BY MOUTH DAILY WITH BREAKFAST.    JANUVIA 100 MG tablet TAKE 1 TABLET BY MOUTH EVERY DAY    lisinopril-hydrochlorothiazide (ZESTORETIC) 10-12.5 MG tablet TAKE 1 TABLET BY MOUTH EVERY DAY    meloxicam (MOBIC) 15 MG tablet Take 1 tablet (15 mg total) by mouth daily as needed. 09/02/2019: As needed    metFORMIN (GLUCOPHAGE) 1000 MG tablet TAKE 1 TABLET BY MOUTH TWICE A DAY    Multiple Vitamin tablet Take 1 tablet by mouth daily.    ONETOUCH ULTRA test  strip USE 1 STRIP TO CHECK BLOOD SUGAR ONCE DAILY    pramipexole (MIRAPEX) 0.5 MG tablet Take 1 tablet (0.5 mg total) by mouth at bedtime.    rosuvastatin (CRESTOR) 5 MG tablet Take 1 tablet (5 mg total) by mouth daily.    No facility-administered encounter medications on file as of 04/13/2021.     Objective:  Patient Care Plan: Diabetes Type 2 (Adult)     Problem Identified: Disease Progression (Diabetes, Type 2)      Long-Range Goal: Disease Progression Prevented or Minimized Completed 04/13/2021  Start Date: 10/27/2020  Expected End Date: 02/24/2021  Priority: High  Note:   Objective:  Lab Results  Component Value Date   HGBA1C 6.3 (A) 01/25/2021    Lab Results  Component Value Date   CREATININE 0.96 05/20/2020    Current Barriers:  Chronic Disease Management support and educational needs r/t Diabetes Management self-management.  Case Manager Clinical Goal(s):  Over the next 120 days, patient will demonstrate improved adherence to prescribed treatment plan for Diabetes management as evidenced by taking medications as prescribed, monitoring and recording CBGs, and adherence to an ADA/ carb modified diet.   Interventions:  Collaboration with Birdie Sons, MD regarding development and update of comprehensive plan of care as evidenced by provider attestation and co-signature Inter-disciplinary care team collaboration (see longitudinal plan of care) Reviewed medications. Reports excellent compliance with medications. No concerns regarding medication management or prescription cost. Reviewed blood glucose  monitoring. Reviewed s/sx of hypoglycemia and hyperglycemia along with recommended interventions. Reports fasting readings have been within range and have improved since consistently taking glipizide. Reports doing well with maintaining a diabetic diet. Declines need for additional diabetic resources.   Patient Goals/Self-Care Activities - Continue to self administer  medications as prescribed - Attend all scheduled provider appointments - Monitor blood glucose levels and utilize recommended treatment interventions  - Continue adherence to recommended cardiac prudent, diabetic diet.  - Notify provider or care management team with questions and new concerns as needed   Goal Met     Patient Care Plan: Hypertension (Adult)     Problem Identified: Hypertension (Hypertension)      Long-Range Goal: Hypertension Monitored Completed 04/13/2021  Start Date: 10/27/2020  Expected End Date: 02/24/2021  Priority: High  Note:   Objective: BP Readings from Last 3 Encounters:  01/25/21 135/79  09/22/20 129/84  05/20/20 125/72     Interventions:  Collaboration with Birdie Sons, MD regarding development and update of comprehensive plan of care as evidenced by provider attestation and co-signature Inter-disciplinary care team collaboration (see longitudinal plan of care) Discussed current plan for Hypertension self management. Reports compliance with treatment recommendations and taking medications as prescribed. BP readings have been within the established range. No concerning symptoms over the past few months. No changes in activity tolerance or ability to perform daily tasks. Encouraged to continue taking medications as prescribed and notify provider if unable to tolerate current regimen.   Patient Goals/Self-Care Activities: - Continue self administering medications as prescribed - Attend all scheduled provider appointments - Monitor and record BP readings. - Continue adherence with recommended diet - Contact provider or care management team with questions or new concerns.    Goal Met       PLAN Mr. Jeremiah Meyer has met his care management goals. Agreed to call if his health needs change and additional outreach is needed. The care management team will gladly assist.    Cristy Friedlander [facility] Care Management [facility] [phone]

## 2021-04-18 ENCOUNTER — Other Ambulatory Visit: Payer: Self-pay | Admitting: Family Medicine

## 2021-04-18 DIAGNOSIS — E119 Type 2 diabetes mellitus without complications: Secondary | ICD-10-CM

## 2021-04-18 NOTE — Telephone Encounter (Signed)
Requested Prescriptions  Pending Prescriptions Disp Refills  . JANUVIA 100 MG tablet [Pharmacy Med Name: JANUVIA 100 MG TABLET] 90 tablet 0    Sig: TAKE 1 TABLET BY MOUTH EVERY DAY     Endocrinology:  Diabetes - DPP-4 Inhibitors Passed - 04/18/2021  8:54 AM      Passed - HBA1C is between 0 and 7.9 and within 180 days    Hemoglobin A1C  Date Value Ref Range Status  01/25/2021 6.3 (A) 4.0 - 5.6 % Final   Hgb A1c MFr Bld  Date Value Ref Range Status  09/22/2020 6.5 (H) 4.8 - 5.6 % Final    Comment:             Prediabetes: 5.7 - 6.4          Diabetes: >6.4          Glycemic control for adults with diabetes: <7.0          Passed - Cr in normal range and within 360 days    Creat  Date Value Ref Range Status  10/09/2017 1.01 0.70 - 1.25 mg/dL Final    Comment:    For patients >44 years of age, the reference limit for Creatinine is approximately 13% higher for people identified as African-American. .    Creatinine, Ser  Date Value Ref Range Status  05/20/2020 0.96 0.76 - 1.27 mg/dL Final   Creatinine, POC  Date Value Ref Range Status  08/22/2016 n/a mg/dL Final         Passed - Valid encounter within last 6 months    Recent Outpatient Visits          2 months ago Controlled type 2 diabetes mellitus without complication, without long-term current use of insulin [facility])   [facility] Malva Limes, MD   6 months ago Iron deficiency   [facility] Sherrie Mustache, Demetrios Isaacs, MD   11 months ago Annual physical exam   [facility] Malva Limes, MD   1 year ago Controlled type 2 diabetes mellitus without complication, without long-term current use of insulin [facility])   [facility] Malva Limes, MD   1 year ago Controlled type 2 diabetes mellitus without complication, without long-term current use of insulin [facility])   [facility] Malva Limes, MD      Future Appointments            In 1 month Fisher,  Demetrios Isaacs, MD [facility]

## 2021-04-25 ENCOUNTER — Other Ambulatory Visit: Payer: Self-pay | Admitting: Family Medicine

## 2021-04-25 DIAGNOSIS — E119 Type 2 diabetes mellitus without complications: Secondary | ICD-10-CM

## 2021-04-29 NOTE — Patient Instructions (Signed)
Thank you for allowing the Chronic Care Management team to participate in your care. It was a pleasure speaking with you. Please feel free to call if you require additional outreach.     Patient Care Plan: Diabetes Type 2 (Adult)   Problem Identified: Disease Progression (Diabetes, Type 2)      Long-Range Goal: Disease Progression Prevented or Minimized Completed 04/13/2021  Start Date: 10/27/2020  Expected End Date: 02/24/2021  Priority: High  Objective:  Lab Results  Component Value Date   HGBA1C 6.3 (A) 01/25/2021    Lab Results  Component Value Date   CREATININE 0.96 05/20/2020    Current Barriers:  Chronic Disease Management support and educational needs r/t Diabetes Management self-management.  Case Manager Clinical Goal(s):  Over the next 120 days, patient will demonstrate improved adherence to prescribed treatment plan for Diabetes management as evidenced by taking medications as prescribed, monitoring and recording CBGs, and adherence to an ADA/ carb modified diet.   Interventions:  Collaboration with Birdie Sons, MD regarding development and update of comprehensive plan of care as evidenced by provider attestation and co-signature Inter-disciplinary care team collaboration (see longitudinal plan of care) Reviewed medications. Reports excellent compliance with medications. No concerns regarding medication management or prescription cost. Reviewed blood glucose monitoring. Reviewed s/sx of hypoglycemia and hyperglycemia along with recommended interventions. Reports fasting readings have been within range and have improved since consistently taking glipizide. Reports doing well with maintaining a diabetic diet. Declines need for additional diabetic resources.   Patient Goals/Self-Care Activities - Continue to self administer medications as prescribed - Attend all scheduled provider appointments - Monitor blood glucose levels and utilize recommended treatment  interventions  - Continue adherence to recommended cardiac prudent, diabetic diet.  - Notify provider or care management team with questions and new concerns as needed   Goal Met     Patient Care Plan: Hypertension (Adult)     Problem Identified: Hypertension (Hypertension)      Long-Range Goal: Hypertension Monitored Completed 04/13/2021  Start Date: 10/27/2020  Expected End Date: 02/24/2021  Priority: High  Note:   Objective: BP Readings from Last 3 Encounters:  01/25/21 135/79  09/22/20 129/84  05/20/20 125/72     Interventions:  Collaboration with Birdie Sons, MD regarding development and update of comprehensive plan of care as evidenced by provider attestation and co-signature Inter-disciplinary care team collaboration (see longitudinal plan of care) Discussed current plan for Hypertension self management. Reports compliance with treatment recommendations and taking medications as prescribed. BP readings have been within the established range. No concerning symptoms over the past few months. No changes in activity tolerance or ability to perform daily tasks. Encouraged to continue taking medications as prescribed and notify provider if unable to tolerate current regimen.   Patient Goals/Self-Care Activities: - Continue self administering medications as prescribed - Attend all scheduled provider appointments - Monitor and record BP readings. - Continue adherence with recommended diet - Contact provider or care management team with questions or new concerns.    Goal Met        Jeremiah Meyer verbalized understanding of the information discussed during the telephonic outreach today. He has met his care management goals. Declined need for mailed/printed instructions. Agreed to call if his health needs change and additional outreach is needed. The care management team will gladly assist.    Jeremiah Meyer [facility] Care Management [facility] [phone]

## 2021-05-25 ENCOUNTER — Ambulatory Visit (INDEPENDENT_AMBULATORY_CARE_PROVIDER_SITE_OTHER): Payer: PPO | Admitting: Family Medicine

## 2021-05-25 ENCOUNTER — Encounter: Payer: Self-pay | Admitting: Family Medicine

## 2021-05-25 ENCOUNTER — Other Ambulatory Visit: Payer: Self-pay

## 2021-05-25 VITALS — BP 134/80 | HR 67 | Ht 64.0 in | Wt 132.0 lb

## 2021-05-25 DIAGNOSIS — E538 Deficiency of other specified B group vitamins: Secondary | ICD-10-CM | POA: Diagnosis not present

## 2021-05-25 DIAGNOSIS — Z0000 Encounter for general adult medical examination without abnormal findings: Secondary | ICD-10-CM

## 2021-05-25 DIAGNOSIS — Z125 Encounter for screening for malignant neoplasm of prostate: Secondary | ICD-10-CM

## 2021-05-25 DIAGNOSIS — G2581 Restless legs syndrome: Secondary | ICD-10-CM | POA: Diagnosis not present

## 2021-05-25 DIAGNOSIS — I10 Essential (primary) hypertension: Secondary | ICD-10-CM

## 2021-05-25 DIAGNOSIS — E119 Type 2 diabetes mellitus without complications: Secondary | ICD-10-CM | POA: Diagnosis not present

## 2021-05-25 DIAGNOSIS — E611 Iron deficiency: Secondary | ICD-10-CM | POA: Diagnosis not present

## 2021-05-25 DIAGNOSIS — E7801 Familial hypercholesterolemia: Secondary | ICD-10-CM

## 2021-05-25 NOTE — Progress Notes (Signed)
Annual Wellness Visit     Patient: Jeremiah Meyer, Male    DOB: 09/21/1951, 70 y.o.   MRN: [id number] Visit Date: 05/25/2021  Today's Provider: Lelon Huh, MD   Chief Complaint  Patient presents with   Annual Exam   Hypertension   Diabetes   Hyperlipidemia   Subjective    Jeremiah Meyer is a 70 y.o. male who presents today for his Annual Wellness Visit.      Medications: Outpatient Medications Prior to Visit  Medication Sig   aspirin 81 MG tablet Take 81 mg by mouth daily.   betamethasone dipropionate (DIPROLENE) 0.05 % cream Apply topically daily. (Patient taking differently: Apply topically daily. As needed for psoriasis flare ups)   Blood Glucose Monitoring Suppl (ONE TOUCH ULTRA 2) w/Device KIT Use to check blood sugar daily for type 2 diabetes   cyanocobalamin (,VITAMIN B-12,) 1000 MCG/ML injection INJECT 1 MLS MONTHLY AS DIRECTED   ferrous sulfate 325 (65 FE) MG tablet Take 325 mg by mouth daily.    fluticasone (FLONASE) 50 MCG/ACT nasal spray Place 2 sprays into both nostrils daily.   glipiZIDE (GLUCOTROL XL) 2.5 MG 24 hr tablet TAKE 1 TABLET BY MOUTH DAILY WITH BREAKFAST.   JANUVIA 100 MG tablet TAKE 1 TABLET BY MOUTH EVERY DAY   lisinopril-hydrochlorothiazide (ZESTORETIC) 10-12.5 MG tablet TAKE 1 TABLET BY MOUTH EVERY DAY   meloxicam (MOBIC) 15 MG tablet Take 1 tablet (15 mg total) by mouth daily as needed.   metFORMIN (GLUCOPHAGE) 1000 MG tablet TAKE 1 TABLET BY MOUTH TWICE A DAY   Multiple Vitamin tablet Take 1 tablet by mouth daily.   ONETOUCH ULTRA test strip USE 1 STRIP TO CHECK BLOOD SUGAR ONCE DAILY   pramipexole (MIRAPEX) 0.5 MG tablet Take 1 tablet (0.5 mg total) by mouth at bedtime.   rosuvastatin (CRESTOR) 5 MG tablet Take 1 tablet (5 mg total) by mouth daily.   No facility-administered medications prior to visit.    Allergies  Allergen Reactions   Simvastatin     Back and leg pain    Patient Care Team: Birdie Sons, MD as PCP -  General (Family Medicine) Anell Barr, OD as Consulting Physician (Optometry)       Objective     Most recent functional status assessment: In your present state of health, do you have any difficulty performing the following activities: 05/25/2021  Hearing? N  Vision? N  Difficulty concentrating or making decisions? N  Walking or climbing stairs? N  Dressing or bathing? N  Doing errands, shopping? N  Some recent data might be hidden   Most recent fall risk assessment: Fall Risk  05/25/2021  Falls in the past year? 0  Number falls in past yr: 0  Injury with Fall? 0  Risk for fall due to : No Fall Risks  Follow up Falls evaluation completed    Most recent depression screenings: PHQ 2/9 Scores 05/25/2021 01/25/2021  PHQ - 2 Score 0 0  PHQ- 9 Score 1 0   Most recent cognitive screening: 6CIT Screen 05/25/2021  What Year? 0 points  What month? 0 points  What time? 0 points  Count back from 20 0 points  Months in reverse 0 points  Repeat phrase 4 points  Total Score 4   Most recent Audit-C alcohol use screening Alcohol Use Disorder Test (AUDIT) 05/25/2021  1. How often do you have a drink containing alcohol? 1  2. How many drinks containing alcohol  do you have on a typical day when you are drinking? 0  3. How often do you have six or more drinks on one occasion? 0  AUDIT-C Score 1  Alcohol Brief Interventions/Follow-up -   A score of 3 or more in women, and 4 or more in men indicates increased risk for alcohol abuse, EXCEPT if all of the points are from question 1   No results found for any visits on 05/25/21.  Assessment & Plan     Annual wellness visit done today including the all of the following: Reviewed patient's Family Medical History Reviewed and updated list of patient's medical providers Assessment of cognitive impairment was done Assessed patient's functional ability Established a written schedule for health screening [place] Completed  and Reviewed  Exercise Activities and Dietary recommendations  Goals      DIET - INCREASE WATER INTAKE     Recommend increasing water intake to 6-8 glasses a day.       Exercise 150 minutes per week (moderate activity)        Immunization History  Administered Date(s) Administered   Influenza, High Dose Seasonal PF 06/19/2017, 08/14/2018, 06/28/2019, 07/27/2020   Influenza,inj,Quad PF,6+ Mos 08/25/2014, 09/04/2015   PFIZER Comirnaty(Gray Top)Covid-19 Tri-Sucrose Vaccine 02/23/2021   PFIZER(Purple Top)SARS-COV-2 Vaccination 12/13/2019, 01/03/2020, 07/27/2020   Pneumococcal Conjugate-13 01/27/2016   Pneumococcal Polysaccharide-23 09/09/2013, 05/03/2019   Td 10/24/1997   Tdap 12/06/2018   Zoster Recombinat (Shingrix) 05/03/2019, 01/24/2020    Health Maintenance  Topic Date Due   FOOT EXAM  05/02/2020   INFLUENZA VACCINE  05/24/2021   COVID-19 Vaccine (5 - Booster for Pfizer series) 06/26/2021   HEMOGLOBIN A1C  07/27/2021   OPHTHALMOLOGY EXAM  02/26/2022   TETANUS/TDAP  12/06/2028   COLONOSCOPY (Pts 45-57yr Insurance coverage will need to be confirmed)  02/18/2031   Hepatitis C Screening  Completed   PNA vac Low Risk Adult  Completed   Zoster Vaccines- Shingrix  Completed   HPV VACCINES  Aged Out     Discussed health benefits of physical activity, and encouraged him to engage in regular exercise appropriate for his age and condition.     No follow-ups on file.       DLelon Huh MD  [phone](phone) [phone](fax)  [place]

## 2021-05-25 NOTE — Progress Notes (Signed)
Complete Physical Exam     Patient: Jeremiah Meyer, Male    DOB: 1951-05-17, 70 y.o.   MRN: [id number] Visit Date: 05/25/2021  Today's Provider: Lelon Huh, MD   No chief complaint on file.  Subjective    Jeremiah Meyer is a 70 y.o. male who presents today for his Complete Physical  He reports consuming a general diet. The patient has a physically strenuous job, but has no regular exercise apart from work.  He generally feels well. He reports sleeping fairly well. He does not have additional problems to discuss today.   HPI  He is due to follow up on diabetes, lipids, b12, iron deficiency, b12 dificiency, and blood pressure. Is doing well with current medications and tolerating without adverse effects. Continue to have RLS, but pramipexole is helping. He did cut his left index finder on a band saw yesterday. He cleaned with water, and applied Vaseline and steri streps and is not bothering him today.     Medications: Outpatient Medications Prior to Visit  Medication Sig   aspirin 81 MG tablet Take 81 mg by mouth daily.   betamethasone dipropionate (DIPROLENE) 0.05 % cream Apply topically daily. (Patient taking differently: Apply topically daily. As needed for psoriasis flare ups)   Blood Glucose Monitoring Suppl (ONE TOUCH ULTRA 2) w/Device KIT Use to check blood sugar daily for type 2 diabetes   cyanocobalamin (,VITAMIN B-12,) 1000 MCG/ML injection INJECT 1 MLS MONTHLY AS DIRECTED   ferrous sulfate 325 (65 FE) MG tablet Take 325 mg by mouth daily.    fluticasone (FLONASE) 50 MCG/ACT nasal spray Place 2 sprays into both nostrils daily.   glipiZIDE (GLUCOTROL XL) 2.5 MG 24 hr tablet TAKE 1 TABLET BY MOUTH DAILY WITH BREAKFAST.   JANUVIA 100 MG tablet TAKE 1 TABLET BY MOUTH EVERY DAY   lisinopril-hydrochlorothiazide (ZESTORETIC) 10-12.5 MG tablet TAKE 1 TABLET BY MOUTH EVERY DAY   meloxicam (MOBIC) 15 MG tablet Take 1 tablet (15 mg total) by mouth daily as needed.    metFORMIN (GLUCOPHAGE) 1000 MG tablet TAKE 1 TABLET BY MOUTH TWICE A DAY   Multiple Vitamin tablet Take 1 tablet by mouth daily.   ONETOUCH ULTRA test strip USE 1 STRIP TO CHECK BLOOD SUGAR ONCE DAILY   pramipexole (MIRAPEX) 0.5 MG tablet Take 1 tablet (0.5 mg total) by mouth at bedtime.   rosuvastatin (CRESTOR) 5 MG tablet Take 1 tablet (5 mg total) by mouth daily.   No facility-administered medications prior to visit.    Allergies  Allergen Reactions   Simvastatin     Back and leg pain    Patient Care Team: Birdie Sons, MD as PCP - General (Family Medicine) Anell Barr, OD as Consulting Physician (Optometry)  Review of Systems  Constitutional: Negative.   HENT: Negative.    Eyes: Negative.   Respiratory: Negative.    Cardiovascular: Negative.   Gastrointestinal: Negative.   Endocrine: Negative.   Genitourinary: Negative.   Musculoskeletal: Negative.   Skin: Negative.   Allergic/Immunologic: Negative.   Neurological: Negative.   Hematological: Negative.   Psychiatric/Behavioral: Negative.    All other systems reviewed and are negative.      Objective    Vitals: BP 134/80 (BP Location: Left Arm, Patient Position: Sitting, Cuff Size: Large)   Pulse 67   Ht _0  (1.626 m)   Wt 132 lb (59.9 kg)   SpO2 97%   BMI 22.66 kg/m    Physical Exam  General Appearance:    Well developed, well nourished male. Alert, cooperative, in no acute distress, appears stated age  Head:    Normocephalic, without obvious abnormality, atraumatic  Eyes:    PERRL, conjunctiva/corneas clear, EOM's intact, fundi    benign, both eyes       Ears:    Normal TM's and external ear canals, both ears  Nose:   Nares normal, septum midline, mucosa normal, no drainage   or sinus tenderness  Back:     Symmetric, no curvature, ROM normal, no CVA tenderness  Lungs:     Clear to auscultation bilaterally, respirations unlabored  Chest wall:    No tenderness or deformity  Heart:    Normal  heart rate. Normal rhythm. No murmurs, rubs, or gallops.  S1 and S2 normal  Abdomen:     Soft, non-tender, bowel sounds active all four quadrants,    no masses, no organomegaly  Genitalia:    deferred  Rectal:    deferred  Extremities:   All extremities are intact. No cyanosis or edema  Pulses:   2+ and symmetric all extremities  Skin:   Skin color, texture, turgor normal. Shallow laceration left index finger as in HPI, no drainage or redness. Well approximated with steri-strips  Lymph nodes:   Cervical, supraclavicular, and axillary nodes normal  Neurologic:   CNII-XII intact. Normal strength, sensation and reflexes      throughout     Assessment & Plan     1. Annual physical exam   2. Prostate cancer screening  - PSA Total (Reflex To Free) ([facility] only)  3. Essential (primary) hypertension Well controlled.  Continue current medications.   - Comprehensive metabolic panel - EKG 49-EEFE  4. Controlled type 2 diabetes mellitus without complication, without long-term current use of insulin ([place]) Doing well on current medications.  - Hemoglobin A1c  5. B12 deficiency  - Vitamin B12  6. Iron deficiency  - Ferritin  7. Essential familial hypercholesterolemia  - CBC - Lipid panel  8. Restless leg syndrome Doing well with iron supplementation and pramipexole.   Discussed health benefits of physical activity, and encouraged him to engage in regular exercise appropriate for his age and condition.        The entirety of the information documented in the History of Present Illness, Review of Systems and Physical Exam were personally obtained by me. Portions of this information were initially documented by the CMA and reviewed by me for thoroughness and accuracy.     Lelon Huh, MD  [facility] [phone] (phone) [phone] (fax)  [place]

## 2021-05-26 LAB — CBC
Hematocrit: 41.4 % (ref 37.5–51.0)
Hemoglobin: 14.1 g/dL (ref 13.0–17.7)
MCH: 30.2 pg (ref 26.6–33.0)
MCHC: 34.1 g/dL (ref 31.5–35.7)
MCV: 89 fL (ref 79–97)
Platelets: 204 10*3/uL (ref 150–450)
RBC: 4.67 x10E6/uL (ref 4.14–5.80)
RDW: 12.5 % (ref 11.6–15.4)
WBC: 6.3 10*3/uL (ref 3.4–10.8)

## 2021-05-26 LAB — LIPID PANEL
Chol/HDL Ratio: 2.8 ratio (ref 0.0–5.0)
Cholesterol, Total: 125 mg/dL (ref 100–199)
HDL: 45 mg/dL (ref >39)
LDL Chol Calc (NIH): 62 mg/dL (ref 0–99)
Triglycerides: 92 mg/dL (ref 0–149)
VLDL Cholesterol Cal: 18 mg/dL (ref 5–40)

## 2021-05-26 LAB — FERRITIN: Ferritin: 72 ng/mL (ref 30–400)

## 2021-05-26 LAB — PSA TOTAL (REFLEX TO FREE): Prostate Specific Ag, Serum: 1.3 ng/mL (ref 0.0–4.0)

## 2021-05-26 LAB — COMPREHENSIVE METABOLIC PANEL
ALT: 14 IU/L (ref 0–44)
AST: 17 IU/L (ref 0–40)
Albumin/Globulin Ratio: 2.2 (ref 1.2–2.2)
Albumin: 4.2 g/dL (ref 3.8–4.8)
Alkaline Phosphatase: 59 IU/L (ref 44–121)
BUN/Creatinine Ratio: 16 (ref 10–24)
BUN: 14 mg/dL (ref 8–27)
Bilirubin Total: 0.6 mg/dL (ref 0.0–1.2)
CO2: 23 mmol/L (ref 20–29)
Calcium: 9 mg/dL (ref 8.6–10.2)
Chloride: 104 mmol/L (ref 96–106)
Creatinine, Ser: 0.88 mg/dL (ref 0.76–1.27)
Globulin, Total: 1.9 g/dL (ref 1.5–4.5)
Glucose: 152 mg/dL — ABNORMAL HIGH (ref 65–99)
Potassium: 4.2 mmol/L (ref 3.5–5.2)
Sodium: 141 mmol/L (ref 134–144)
Total Protein: 6.1 g/dL (ref 6.0–8.5)
eGFR: 93 mL/min/{1.73_m2} (ref >59)

## 2021-05-26 LAB — HEMOGLOBIN A1C
Est. average glucose Bld gHb Est-mCnc: 148 mg/dL
Hgb A1c MFr Bld: 6.8 % — ABNORMAL HIGH (ref 4.8–5.6)

## 2021-05-26 LAB — VITAMIN B12: Vitamin B-12: 698 pg/mL (ref 232–1245)

## 2021-06-01 ENCOUNTER — Other Ambulatory Visit: Payer: Self-pay | Admitting: Family Medicine

## 2021-06-01 DIAGNOSIS — I10 Essential (primary) hypertension: Secondary | ICD-10-CM

## 2021-06-12 ENCOUNTER — Other Ambulatory Visit: Payer: Self-pay | Admitting: Family Medicine

## 2021-06-12 NOTE — Telephone Encounter (Signed)
Requested Prescriptions  Pending Prescriptions Disp Refills  . glipiZIDE (GLUCOTROL XL) 2.5 MG 24 hr tablet [Pharmacy Med Name: GLIPIZIDE ER 2.5 MG TABLET] 90 tablet 0    Sig: TAKE 1 TABLET BY MOUTH EVERY DAY WITH BREAKFAST     Endocrinology:  Diabetes - Sulfonylureas Passed - 06/12/2021  9:05 AM      Passed - HBA1C is between 0 and 7.9 and within 180 days    Hgb A1c MFr Bld  Date Value Ref Range Status  05/25/2021 6.8 (H) 4.8 - 5.6 % Final    Comment:             Prediabetes: 5.7 - 6.4          Diabetes: >6.4          Glycemic control for adults with diabetes: <7.0          Passed - Valid encounter within last 6 months    Recent Outpatient Visits          2 weeks ago Annual physical exam   [facility] Malva Limes, MD   4 months ago Controlled type 2 diabetes mellitus without complication, without long-term current use of insulin [facility])   [facility] Malva Limes, MD   8 months ago Iron deficiency   [facility] Fisher, Demetrios Isaacs, MD   1 year ago Annual physical exam   [facility] Malva Limes, MD   1 year ago Controlled type 2 diabetes mellitus without complication, without long-term current use of insulin [facility])   [facility] Malva Limes, MD      Future Appointments            In 3 months Fisher, Demetrios Isaacs, MD [facility]

## 2021-06-13 ENCOUNTER — Other Ambulatory Visit: Payer: Self-pay | Admitting: Family Medicine

## 2021-06-13 DIAGNOSIS — E119 Type 2 diabetes mellitus without complications: Secondary | ICD-10-CM

## 2021-06-13 NOTE — Telephone Encounter (Signed)
Requested Prescriptions  Pending Prescriptions Disp Refills  . metFORMIN (GLUCOPHAGE) 1000 MG tablet [Pharmacy Med Name: METFORMIN HCL 1,000 MG TABLET] 180 tablet 1    Sig: TAKE 1 TABLET BY MOUTH TWICE A DAY     Endocrinology:  Diabetes - Biguanides Passed - 06/13/2021  3:21 PM      Passed - Cr in normal range and within 360 days    Creat  Date Value Ref Range Status  10/09/2017 1.01 0.70 - 1.25 mg/dL Final    Comment:    For patients >70 years of age, the reference limit for Creatinine is approximately 13% higher for people identified as African-American. .    Creatinine, Ser  Date Value Ref Range Status  05/25/2021 0.88 0.76 - 1.27 mg/dL Final   Creatinine, POC  Date Value Ref Range Status  08/22/2016 n/a mg/dL Final         Passed - HBA1C is between 0 and 7.9 and within 180 days    Hgb A1c MFr Bld  Date Value Ref Range Status  05/25/2021 6.8 (H) 4.8 - 5.6 % Final    Comment:             Prediabetes: 5.7 - 6.4          Diabetes: >6.4          Glycemic control for adults with diabetes: <7.0          Passed - eGFR in normal range and within 360 days    GFR calc Af Amer  Date Value Ref Range Status  05/20/2020 93 >59 mL/min/1.73 Final    Comment:    **[facility] currently reports eGFR in compliance with the current**   recommendations of the [employer]. [facility] will   update reporting as new guidelines are published from the NKF-ASN   Task force.    GFR calc non Af Amer  Date Value Ref Range Status  05/20/2020 80 >59 mL/min/1.73 Final   eGFR  Date Value Ref Range Status  05/25/2021 93 >59 mL/min/1.73 Final         Passed - Valid encounter within last 6 months    Recent Outpatient Visits          2 weeks ago Annual physical exam   [facility] Birdie Sons, MD   4 months ago Controlled type 2 diabetes mellitus without complication, without long-term current use of insulin [facility])   [facility] Birdie Sons,  MD   8 months ago Iron deficiency   [place], Kirstie Peri, MD   1 year ago Annual physical exam   [facility] Birdie Sons, MD   1 year ago Controlled type 2 diabetes mellitus without complication, without long-term current use of insulin [facility])   [facility] Birdie Sons, MD      Future Appointments            In 3 months Fisher, Kirstie Peri, MD [facility] [place]

## 2021-07-09 ENCOUNTER — Other Ambulatory Visit: Payer: Self-pay | Admitting: Family Medicine

## 2021-07-09 DIAGNOSIS — E119 Type 2 diabetes mellitus without complications: Secondary | ICD-10-CM

## 2021-07-16 ENCOUNTER — Other Ambulatory Visit: Payer: Self-pay | Admitting: Family Medicine

## 2021-07-16 DIAGNOSIS — E119 Type 2 diabetes mellitus without complications: Secondary | ICD-10-CM

## 2021-08-10 ENCOUNTER — Other Ambulatory Visit: Payer: Self-pay | Admitting: Family Medicine

## 2021-08-10 DIAGNOSIS — G2581 Restless legs syndrome: Secondary | ICD-10-CM

## 2021-09-18 ENCOUNTER — Other Ambulatory Visit: Payer: Self-pay | Admitting: Family Medicine

## 2021-09-19 NOTE — Telephone Encounter (Signed)
Requested Prescriptions  Pending Prescriptions Disp Refills  . glipiZIDE (GLUCOTROL XL) 2.5 MG 24 hr tablet [Pharmacy Med Name: GLIPIZIDE ER 2.5 MG TABLET] 90 tablet 0    Sig: TAKE 1 TABLET BY MOUTH EVERY DAY WITH BREAKFAST     Endocrinology:  Diabetes - Sulfonylureas Passed - 09/18/2021  9:14 AM      Passed - HBA1C is between 0 and 7.9 and within 180 days    Hgb A1c MFr Bld  Date Value Ref Range Status  05/25/2021 6.8 (H) 4.8 - 5.6 % Final    Comment:             Prediabetes: 5.7 - 6.4          Diabetes: >6.4          Glycemic control for adults with diabetes: <7.0          Passed - Valid encounter within last 6 months    Recent Outpatient Visits          3 months ago Annual physical exam   [facility], [facility]   7 months ago Controlled type 2 diabetes mellitus without complication, without long-term current use of insulin [facility])   [facility] Malva Limes, MD   12 months ago Iron deficiency   [facility] Fisher, Demetrios Isaacs, MD   1 year ago Annual physical exam   [facility] Malva Limes, MD   1 year ago Controlled type 2 diabetes mellitus without complication, without long-term current use of insulin [facility])   [facility] Malva Limes, MD      Future Appointments            In 2 weeks Fisher, Demetrios Isaacs, MD [facility]

## 2021-09-27 ENCOUNTER — Ambulatory Visit: Payer: PPO | Admitting: Family Medicine

## 2021-10-04 ENCOUNTER — Ambulatory Visit (INDEPENDENT_AMBULATORY_CARE_PROVIDER_SITE_OTHER): Payer: PPO | Admitting: Family Medicine

## 2021-10-04 ENCOUNTER — Encounter: Payer: Self-pay | Admitting: Family Medicine

## 2021-10-04 ENCOUNTER — Other Ambulatory Visit: Payer: Self-pay

## 2021-10-04 VITALS — BP 134/77 | HR 62 | Temp 98.6°F | Resp 16 | Wt 132.0 lb

## 2021-10-04 DIAGNOSIS — E119 Type 2 diabetes mellitus without complications: Secondary | ICD-10-CM

## 2021-10-04 DIAGNOSIS — G2581 Restless legs syndrome: Secondary | ICD-10-CM | POA: Diagnosis not present

## 2021-10-04 DIAGNOSIS — I10 Essential (primary) hypertension: Secondary | ICD-10-CM | POA: Diagnosis not present

## 2021-10-04 DIAGNOSIS — E611 Iron deficiency: Secondary | ICD-10-CM

## 2021-10-04 LAB — POCT GLYCOSYLATED HEMOGLOBIN (HGB A1C)
Est. average glucose Bld gHb Est-mCnc: 137
Hemoglobin A1C: 6.4 % — AB (ref 4.0–5.6)

## 2021-10-04 MED ORDER — FERROUS GLUCONATE 324 (38 FE) MG PO TABS: 324.0000 mg | ORAL_TABLET | Freq: Every day | ORAL | 3 refills | Status: DC

## 2021-10-04 MED ORDER — GABAPENTIN 300 MG PO CAPS: 300.0000 mg | ORAL_CAPSULE | Freq: Three times a day (TID) | ORAL | 3 refills | Status: DC

## 2021-10-04 NOTE — Patient Instructions (Signed)
Please review the attached list of medications and notify my office if there are any errors.   Start taking the gabapentin 300mg  every evening. If restless legs are not much better within a week, you add the Mirapex in the evening.

## 2021-10-04 NOTE — Progress Notes (Signed)
Established patient visit   Patient: Jeremiah Meyer   DOB: August 27, 1951   70 y.o. Male  MRN: [id number] Visit Date: 10/04/2021  Today's healthcare provider: Lelon Huh, MD   Chief Complaint  Patient presents with   Diabetes   Hypertension   Subjective    HPI  Diabetes Mellitus Type II, Follow-up  Lab Results  Component Value Date   HGBA1C 6.8 (H) 05/25/2021   HGBA1C 6.3 (A) 01/25/2021   HGBA1C 6.5 (H) 09/22/2020   Wt Readings from Last 3 Encounters:  10/04/21 132 lb (59.9 kg)  05/25/21 132 lb (59.9 kg)  02/17/21 122 lb (55.3 kg)   Last seen for diabetes 4 months ago.  Management since then includes advising patient to be a little more strict about avoiding sweets and starchy foods in his diet. Continue current medications.   He reports good compliance with treatment. He is not having side effects.  Symptoms: No fatigue No foot ulcerations  No appetite changes No nausea  No paresthesia of the feet  No polydipsia  No polyuria No visual disturbances   No vomiting     Home blood sugar records: fasting range: 130-170  Episodes of hypoglycemia? No    Current insulin regiment: none Most Recent Eye Exam: 02/26/2022 Current exercise: none Current diet habits: in general, an "unhealthy" diet  Pertinent Labs: Lab Results  Component Value Date   CHOL 125 05/25/2021   HDL 45 05/25/2021   LDLCALC 62 05/25/2021   TRIG 92 05/25/2021   CHOLHDL 2.8 05/25/2021   Lab Results  Component Value Date   NA 141 05/25/2021   K 4.2 05/25/2021   CREATININE 0.88 05/25/2021   EGFR 93 05/25/2021   MICROALBUR 20 10/30/2018     ---------------------------------------------------------------------------------------------------   Hypertension, follow-up  BP Readings from Last 3 Encounters:  10/04/21 134/77  05/25/21 134/80  02/17/21 116/77   Wt Readings from Last 3 Encounters:  10/04/21 132 lb (59.9 kg)  05/25/21 132 lb (59.9 kg)  02/17/21 122 lb (55.3 kg)     He  was last seen for hypertension 4 months ago.  BP at that visit was 134/80. Management since that visit includes continuing same medication.  He reports good compliance with treatment. He is not having side effects.  He is following a Regular diet. He is not exercising. He does not smoke.  Use of agents associated with hypertension: NSAIDS.   Outside blood pressures are not checked. Symptoms: No chest pain No chest pressure  No palpitations No syncope  No dyspnea No orthopnea  No paroxysmal nocturnal dyspnea No lower extremity edema   Pertinent labs: Lab Results  Component Value Date   CHOL 125 05/25/2021   HDL 45 05/25/2021   LDLCALC 62 05/25/2021   TRIG 92 05/25/2021   CHOLHDL 2.8 05/25/2021   Lab Results  Component Value Date   NA 141 05/25/2021   K 4.2 05/25/2021   CREATININE 0.88 05/25/2021   EGFR 93 05/25/2021   GLUCOSE 152 (H) 05/25/2021   TSH 1.270 05/20/2020     The ASCVD Risk score (Arnett DK, et al., 2019) failed to calculate for the following reasons:   The valid total cholesterol range is 130 to 320 mg/dL   ---------------------------------------------------------------------------------------------------  His main complaint today is that restless leg syndrome is still not controlled and keeping him up at night. Had been taking daily iron sulfate, and gone back and forth with ropinirole and pramipexole. He does have history of borderline low  iron level and is concerned he may no be be absorbing enough to reach the CNS.   Lab Results  Component Value Date   FERRITIN 72 05/25/2021   FERRITIN 54 09/22/2020   FERRITIN 65 05/20/2020       Medications: Outpatient Medications Prior to Visit  Medication Sig   aspirin 81 MG tablet Take 81 mg by mouth daily.   betamethasone dipropionate (DIPROLENE) 0.05 % cream Apply topically daily. (Patient taking differently: Apply topically daily. As needed for psoriasis flare ups)   Blood Glucose Monitoring Suppl (ONE  TOUCH ULTRA 2) w/Device KIT Use to check blood sugar daily for type 2 diabetes   cyanocobalamin (,VITAMIN B-12,) 1000 MCG/ML injection INJECT 1 MLS MONTHLY AS DIRECTED   ferrous sulfate 325 (65 FE) MG tablet Take 325 mg by mouth daily.    fluticasone (FLONASE) 50 MCG/ACT nasal spray Place 2 sprays into both nostrils daily.   glipiZIDE (GLUCOTROL XL) 2.5 MG 24 hr tablet TAKE 1 TABLET BY MOUTH EVERY DAY WITH BREAKFAST   JANUVIA 100 MG tablet TAKE 1 TABLET BY MOUTH EVERY DAY   lisinopril-hydrochlorothiazide (ZESTORETIC) 10-12.5 MG tablet TAKE 1 TABLET BY MOUTH EVERY DAY   meloxicam (MOBIC) 15 MG tablet Take 1 tablet (15 mg total) by mouth daily as needed.   metFORMIN (GLUCOPHAGE) 1000 MG tablet TAKE 1 TABLET BY MOUTH TWICE A DAY   Multiple Vitamin tablet Take 1 tablet by mouth daily.   ONETOUCH ULTRA test strip USE 1 STRIP TO CHECK BLOOD SUGAR ONCE DAILY   pramipexole (MIRAPEX) 0.5 MG tablet TAKE 1 TABLET BY MOUTH AT BEDTIME.   rosuvastatin (CRESTOR) 5 MG tablet TAKE 1 TABLET BY MOUTH EVERY DAY   No facility-administered medications prior to visit.    Review of Systems  Constitutional:  Negative for appetite change, chills and fever.  Respiratory:  Negative for chest tightness, shortness of breath and wheezing.   Cardiovascular:  Negative for chest pain and palpitations.  Gastrointestinal:  Negative for abdominal pain, nausea and vomiting.  Skin:  Positive for wound (on left hand).      Objective    BP 134/77 (BP Location: Right Arm, Patient Position: Sitting, Cuff Size: Normal)   Pulse 62   Temp 98.6 F (37 C) (Oral)   Resp 16   Wt 132 lb (59.9 kg)   SpO2 100% Comment: room air  BMI 22.66 kg/m  {Show previous vital signs (optional):23777}  Physical Exam  General appearance: Well developed, well nourished male, cooperative and in no acute distress Head: Normocephalic, without obvious abnormality, atraumatic Respiratory: Respirations even and unlabored, normal respiratory  rate Extremities: All extremities are intact.  Skin: Skin color, texture, turgor normal. No rashes seen  Psych: Appropriate mood and affect. Neurologic: Mental status: Alert, oriented to person, place, and time, thought content appropriate.   Results for orders placed or performed in visit on 10/04/21  POCT HgB A1C  Result Value Ref Range   Hemoglobin A1C 6.4 (A) 4.0 - 5.6 %   Est. average glucose Bld gHb Est-mCnc 137      Assessment & Plan     1. Controlled type 2 diabetes mellitus without complication, without long-term current use of insulin (HCC) Continue current medications.    2. Essential (primary) hypertension Well controlled.  Continue current medications.    3. Restless leg Not controlled with ropinirole or primapexole. Will try  gabapentin (NEURONTIN) 300 MG capsule; Take 1 capsule (300 mg total) by mouth 3 (three) times daily.  Dispense: 90  capsule; Refill: 3  4. Iron deficiency He is taking ferrous sulfate daily but still having RLS symptoms. He thinks he may need more iron to get into CNS. Will try ferrous gluconate (FERGON) 324 MG tablet; Take 1 tablet (324 mg total) by mouth daily with breakfast.  Dispense: 100 tablet; Refill: 3  Counseled he probably would not qualify for iron infusions consider his hemoglobin and ferritin levels. Consider checking serum iron and IBC  Future Appointments  Date Time Provider [place]  02/14/2022  8:20 AM Birdie Sons, MD [facility]        The entirety of the information documented in the History of Present Illness, Review of Systems and Physical Exam were personally obtained by me. Portions of this information were initially documented by the CMA and reviewed by me for thoroughness and accuracy.     Lelon Huh, MD  [facility] [phone] (phone) [phone] (fax)  [place]

## 2021-10-11 ENCOUNTER — Encounter: Payer: Self-pay | Admitting: Family Medicine

## 2021-10-12 ENCOUNTER — Other Ambulatory Visit: Payer: Self-pay | Admitting: Family Medicine

## 2021-10-12 DIAGNOSIS — E119 Type 2 diabetes mellitus without complications: Secondary | ICD-10-CM

## 2021-11-29 ENCOUNTER — Other Ambulatory Visit: Payer: Self-pay | Admitting: Family Medicine

## 2021-11-29 DIAGNOSIS — I10 Essential (primary) hypertension: Secondary | ICD-10-CM

## 2021-11-29 DIAGNOSIS — E538 Deficiency of other specified B group vitamins: Secondary | ICD-10-CM

## 2021-11-29 DIAGNOSIS — E119 Type 2 diabetes mellitus without complications: Secondary | ICD-10-CM

## 2022-01-25 ENCOUNTER — Other Ambulatory Visit: Payer: Self-pay | Admitting: Family Medicine

## 2022-01-25 DIAGNOSIS — G2581 Restless legs syndrome: Secondary | ICD-10-CM

## 2022-02-14 ENCOUNTER — Encounter: Payer: Self-pay | Admitting: Family Medicine

## 2022-02-14 ENCOUNTER — Ambulatory Visit (INDEPENDENT_AMBULATORY_CARE_PROVIDER_SITE_OTHER): Payer: PPO | Admitting: Family Medicine

## 2022-02-14 VITALS — BP 122/70 | HR 73 | Temp 97.9°F | Resp 14 | Wt 131.0 lb

## 2022-02-14 DIAGNOSIS — G2581 Restless legs syndrome: Secondary | ICD-10-CM

## 2022-02-14 DIAGNOSIS — E611 Iron deficiency: Secondary | ICD-10-CM

## 2022-02-14 DIAGNOSIS — E119 Type 2 diabetes mellitus without complications: Secondary | ICD-10-CM | POA: Diagnosis not present

## 2022-02-14 NOTE — Progress Notes (Signed)
?  ? ?I,Roshena L Chambers,acting as a scribe for Lelon Huh, MD.,have documented all relevant documentation on the behalf of Lelon Huh, MD,as directed by  Lelon Huh, MD while in the presence of Lelon Huh, MD.  ? ?Established patient visit ? ? ?Patient: Jeremiah Meyer   DOB: Apr 29, 1951   71 y.o. Male  MRN: [id number] ?Visit Date: 02/14/2022 ? ?Today's healthcare provider: Lelon Huh, MD  ? ?Chief Complaint  ?Patient presents with  ? Hypertension  ? Diabetes  ? Anemia  ? ?Subjective  ?  ?HPI  ?Hypertension, follow-up ? ?BP Readings from Last 3 Encounters:  ?10/04/21 134/77  ?05/25/21 134/80  ?02/17/21 116/77  ? Wt Readings from Last 3 Encounters:  ?10/04/21 132 lb (59.9 kg)  ?05/25/21 132 lb (59.9 kg)  ?02/17/21 122 lb (55.3 kg)  ?  ? ?He was last seen for hypertension 4 months ago.  ?BP at that visit was 134 77 . Management since that visit includes continue medication. Well controlled.  ? ?He reports good compliance with treatment. ?He is not having side effects.  ?He is following a Regular diet. ?He is not exercising. ?He does not smoke. ? ?Use of agents associated with hypertension: NSAIDS.  ? ?Outside blood pressures are not checked. ?Symptoms: ?No chest pain No chest pressure  ?No palpitations No syncope  ?No dyspnea No orthopnea  ?No paroxysmal nocturnal dyspnea No lower extremity edema  ? ?---------------------------------------------------------------------------------------------------  ?Diabetes Mellitus Type II, Follow-up ? ?Lab Results  ?Component Value Date  ? HGBA1C 6.4 (A) 10/04/2021  ? HGBA1C 6.8 (H) 05/25/2021  ? HGBA1C 6.3 (A) 01/25/2021  ? ?Wt Readings from Last 3 Encounters:  ?10/04/21 132 lb (59.9 kg)  ?05/25/21 132 lb (59.9 kg)  ?02/17/21 122 lb (55.3 kg)  ? ?Last seen for diabetes 4 months ago.  ?Management since then includes continue current medication.  ?He reports good compliance with treatment. ?He is not having side effects.  ?Symptoms: ?No fatigue No foot ulcerations   ?No appetite changes No nausea  ?No paresthesia of the feet  No polydipsia  ?No polyuria No visual disturbances   ?No vomiting   ? ? ?Home blood sugar records: fasting range: 150's ? ?Episodes of hypoglycemia? No  ?  ?Current insulin regiment: none ?Most Recent Eye Exam: 02/26/2021 ?Current exercise: none ?Current diet habits: in general, an "unhealthy" diet ? ?Pertinent Labs: ?Lab Results  ?Component Value Date  ? CHOL 125 05/25/2021  ? HDL 45 05/25/2021  ? [place] 62 05/25/2021  ? TRIG 92 05/25/2021  ? CHOLHDL 2.8 05/25/2021  ? Lab Results  ?Component Value Date  ? NA 141 05/25/2021  ? K 4.2 05/25/2021  ? CREATININE 0.88 05/25/2021  ? EGFR 93 05/25/2021  ? MICROALBUR 20 10/30/2018  ?  ? ?---------------------------------------------------------------------------------------------------  ?Follow up for Iron deficiency: ? ?The patient was last seen for this 4 months ago. ?Changes made at last visit include trying ferrous gluconate (FERGON) 324 MG tablet; Take 1 tablet (324 mg total) by mouth daily with breakfast. ?Last ferritin in August was 72, up from pre-treatment ferritin of 28 in 2020. Primary manifestation of iron deficiency has been RLS which is improved, but still has occasional episodes that keep him up all night.  ?He reports good compliance with treatment. ?He feels that condition is Unchanged. ?He is not having side effects.  ? ?-----------------------------------------------------------------------------------------  ? ?Medications: ?Outpatient Medications Prior to Visit  ?Medication Sig  ? aspirin 81 MG tablet Take 81 mg by mouth daily.  ?  betamethasone dipropionate (DIPROLENE) 0.05 % cream Apply topically daily. (Patient taking differently: Apply topically daily. As needed for psoriasis flare ups)  ? Blood Glucose Monitoring Suppl (ONE TOUCH ULTRA 2) w/Device KIT Use to check blood sugar daily for type 2 diabetes  ? cyanocobalamin (,VITAMIN B-12,) 1000 MCG/ML injection INJECT 1 MLS MONTHLY AS  DIRECTED  ? ferrous gluconate (FERGON) 324 MG tablet Take 1 tablet (324 mg total) by mouth daily with breakfast.  ? fluticasone (FLONASE) 50 MCG/ACT nasal spray Place 2 sprays into both nostrils daily.  ? gabapentin (NEURONTIN) 300 MG capsule TAKE 1 CAPSULE BY MOUTH THREE TIMES A DAY  ? glipiZIDE (GLUCOTROL XL) 2.5 MG 24 hr tablet TAKE 1 TABLET BY MOUTH EVERY DAY WITH BREAKFAST  ? JANUVIA 100 MG tablet TAKE 1 TABLET BY MOUTH EVERY DAY  ? lisinopril-hydrochlorothiazide (ZESTORETIC) 10-12.5 MG tablet TAKE 1 TABLET BY MOUTH EVERY DAY  ? meloxicam (MOBIC) 15 MG tablet Take 1 tablet (15 mg total) by mouth daily as needed.  ? metFORMIN (GLUCOPHAGE) 1000 MG tablet TAKE 1 TABLET BY MOUTH TWICE A DAY  ? Multiple Vitamin tablet Take 1 tablet by mouth daily.  ? ONETOUCH ULTRA test strip USE 1 STRIP TO CHECK BLOOD SUGAR ONCE DAILY  ? pramipexole (MIRAPEX) 0.5 MG tablet TAKE 1 TABLET BY MOUTH AT BEDTIME.  ? rosuvastatin (CRESTOR) 5 MG tablet TAKE 1 TABLET BY MOUTH EVERY DAY  ? ?No facility-administered medications prior to visit.  ? ? ? ? ? ?  Objective  ?  ?BP 122/70 (BP Location: Left Arm, Patient Position: Sitting, Cuff Size: Normal)   Pulse 73   Temp 97.9 ?F (36.6 ?C) (Oral)   Resp 14   Wt 131 lb (59.4 kg)   SpO2 98% Comment: room air  BMI 22.49 kg/m?  ? ? ?Physical Exam  ? ?General appearance: Well developed, well nourished male, cooperative and in no acute distress ?Head: Normocephalic, without obvious abnormality, atraumatic ?Respiratory: Respirations even and unlabored, normal respiratory rate ?Extremities: All extremities are intact.  ?Skin: Skin color, texture, turgor normal. No rashes seen  ?Psych: Appropriate mood and affect. ?Neurologic: Mental status: Alert, oriented to person, place, and time, thought content appropriate.  ? Assessment & Plan  ?  ? ?1. Controlled type 2 diabetes mellitus without complication, without long-term current use of insulin ([place]) ? ?- Urine Albumin-Creatinine with uACR ?- Hemoglobin  A1c ? ?2. Iron deficiency ? ?- Ferritin ? ?3. Restless leg ?Improved on gabapentin and iron supplements, although still has occasional restless nights  ?   ? ?The entirety of the information documented in the History of Present Illness, Review of Systems and Physical Exam were personally obtained by me. Portions of this information were initially documented by the CMA and reviewed by me for thoroughness and accuracy.   ? ? ?Lelon Huh, MD  ?[facility] ?[phone] (phone) ?[phone] (fax) ? ?[facility] ?

## 2022-02-15 LAB — FERRITIN: Ferritin: 65 ng/mL (ref 30–400)

## 2022-02-15 LAB — MICROALBUMIN / CREATININE URINE RATIO
Creatinine, Urine: 72.7 mg/dL
Microalb/Creat Ratio: <4 mg/g creat (ref 0–29)
Microalbumin, Urine: <3 ug/mL

## 2022-02-15 LAB — HEMOGLOBIN A1C
Est. average glucose Bld gHb Est-mCnc: 140 mg/dL
Hgb A1c MFr Bld: 6.5 % — ABNORMAL HIGH (ref 4.8–5.6)

## 2022-02-26 ENCOUNTER — Other Ambulatory Visit: Payer: Self-pay | Admitting: Family Medicine

## 2022-03-15 DIAGNOSIS — H2513 Age-related nuclear cataract, bilateral: Secondary | ICD-10-CM | POA: Diagnosis not present

## 2022-03-15 DIAGNOSIS — E119 Type 2 diabetes mellitus without complications: Secondary | ICD-10-CM | POA: Diagnosis not present

## 2022-03-15 DIAGNOSIS — H5203 Hypermetropia, bilateral: Secondary | ICD-10-CM | POA: Diagnosis not present

## 2022-03-15 DIAGNOSIS — H25013 Cortical age-related cataract, bilateral: Secondary | ICD-10-CM | POA: Diagnosis not present

## 2022-03-15 LAB — HM DIABETES EYE EXAM

## 2022-04-23 ENCOUNTER — Other Ambulatory Visit: Payer: Self-pay | Admitting: Family Medicine

## 2022-04-23 DIAGNOSIS — E119 Type 2 diabetes mellitus without complications: Secondary | ICD-10-CM

## 2022-05-18 ENCOUNTER — Telehealth: Payer: Self-pay | Admitting: Family Medicine

## 2022-05-18 NOTE — Telephone Encounter (Signed)
Copied from CRM [id number]. Topic: Medicare AWV >> May 18, 2022 10:48 AM Zannie Kehr wrote: Reason for CRM:  Left message for patient to call back and schedule Medicare Annual Wellness Visit (AWV) in office.   If unable to come into the office for AWV,  please offer to do virtually or by telephone.  Last AWV: 05/25/2021  Please schedule at anytime with [facility] Health Advisor.  30 minute appointment for Virtual or phone 45 minute appointment for in office or Initial virtual/phone  Any questions, please contact me at [phone]

## 2022-05-26 ENCOUNTER — Other Ambulatory Visit: Payer: Self-pay | Admitting: Family Medicine

## 2022-05-26 DIAGNOSIS — E119 Type 2 diabetes mellitus without complications: Secondary | ICD-10-CM

## 2022-05-26 DIAGNOSIS — I10 Essential (primary) hypertension: Secondary | ICD-10-CM

## 2022-05-30 ENCOUNTER — Ambulatory Visit (INDEPENDENT_AMBULATORY_CARE_PROVIDER_SITE_OTHER): Payer: PPO

## 2022-05-30 VITALS — Wt 131.0 lb

## 2022-05-30 DIAGNOSIS — Z0000 Encounter for general adult medical examination without abnormal findings: Secondary | ICD-10-CM

## 2022-05-30 NOTE — Patient Instructions (Signed)
Jeremiah Meyer , Thank you for taking time to come for your Medicare Wellness Visit. I appreciate your ongoing commitment to your health goals. Please review the following plan we discussed and let me know if I can assist you in the future.   Screening recommendations/referrals: Colonoscopy: 02/17/21 Recommended yearly ophthalmology/optometry visit for glaucoma screening and checkup Recommended yearly dental visit for hygiene and checkup  Vaccinations: Influenza vaccine: 07/27/21 Pneumococcal vaccine: 05/03/19 Tdap vaccine: 12/06/18 Shingles vaccine: Shingrix 05/03/19, 01/24/20   Covid-19: 12/13/19, 01/03/20, 07/27/20, 02/23/21, 07/27/21  Advanced directives: no  Conditions/risks identified: none  Next appointment: Follow up in one year for your annual wellness visit  Preventive Care 65 Years and Older, Male Preventive care refers to lifestyle choices and visits with your health care provider that can promote health and wellness. What does preventive care include? A yearly physical exam. This is also called an annual well check. Dental exams once or twice a year. Routine eye exams. Ask your health care provider how often you should have your eyes checked. Personal lifestyle choices, including: Daily care of your teeth and gums. Regular physical activity. Eating a healthy diet. Avoiding tobacco and drug use. Limiting alcohol use. Practicing safe sex. Taking low doses of aspirin every day. Taking vitamin and mineral supplements as recommended by your health care provider. What happens during an annual well check? The services and screenings done by your health care provider during your annual well check will depend on your age, overall health, lifestyle risk factors, and family history of disease. Counseling  Your health care provider may ask you questions about your: Alcohol use. Tobacco use. Drug use. Emotional well-being. Home and relationship well-being. Sexual activity. Eating  habits. History of falls. Memory and ability to understand (cognition). Work and work Astronomer. Screening  You may have the following tests or measurements: Height, weight, and BMI. Blood pressure. Lipid and cholesterol levels. These may be checked every 5 years, or more frequently if you are over 49 years old. Skin check. Lung cancer screening. You may have this screening every year starting at age 8 if you have a 30-pack-year history of smoking and currently smoke or have quit within the past 15 years. Fecal occult blood test (FOBT) of the stool. You may have this test every year starting at age 5. Flexible sigmoidoscopy or colonoscopy. You may have a sigmoidoscopy every 5 years or a colonoscopy every 10 years starting at age 59. Prostate cancer screening. Recommendations will vary depending on your family history and other risks. Hepatitis C blood test. Hepatitis B blood test. Sexually transmitted disease (STD) testing. Diabetes screening. This is done by checking your blood sugar (glucose) after you have not eaten for a while (fasting). You may have this done every 1-3 years. Abdominal aortic aneurysm (AAA) screening. You may need this if you are a current or former smoker. Osteoporosis. You may be screened starting at age 27 if you are at high risk. Talk with your health care provider about your test results, treatment options, and if necessary, the need for more tests. Vaccines  Your health care provider may recommend certain vaccines, such as: Influenza vaccine. This is recommended every year. Tetanus, diphtheria, and acellular pertussis (Tdap, Td) vaccine. You may need a Td booster every 10 years. Zoster vaccine. You may need this after age 40. Pneumococcal 13-valent conjugate (PCV13) vaccine. One dose is recommended after age 17. Pneumococcal polysaccharide (PPSV23) vaccine. One dose is recommended after age 11. Talk to your health care provider about  which screenings and  vaccines you need and how often you need them. This information is not intended to replace advice given to you by your health care provider. Make sure you discuss any questions you have with your health care provider. Document Released: 11/06/2015 Document Revised: 06/29/2016 Document Reviewed: 08/11/2015 Elsevier Interactive Patient Education  2017 [employer] Prevention in the Home Falls can cause injuries. They can happen to people of all ages. There are many things you can do to make your home safe and to help prevent falls. What can I do on the outside of my home? Regularly fix the edges of walkways and driveways and fix any cracks. Remove anything that might make you trip as you walk through a door, such as a raised step or threshold. Trim any bushes or trees on the path to your home. Use bright outdoor lighting. Clear any walking paths of anything that might make someone trip, such as rocks or tools. Regularly check to see if handrails are loose or broken. Make sure that both sides of any steps have handrails. Any raised decks and porches should have guardrails on the edges. Have any leaves, snow, or ice cleared regularly. Use sand or salt on walking paths during winter. Clean up any spills in your garage right away. This includes oil or grease spills. What can I do in the bathroom? Use night lights. Install grab bars by the toilet and in the tub and shower. Do not use towel bars as grab bars. Use non-skid mats or decals in the tub or shower. If you need to sit down in the shower, use a plastic, non-slip stool. Keep the floor dry. Clean up any water that spills on the floor as soon as it happens. Remove soap buildup in the tub or shower regularly. Attach bath mats securely with double-sided non-slip rug tape. Do not have throw rugs and other things on the floor that can make you trip. What can I do in the bedroom? Use night lights. Make sure that you have a light by your  bed that is easy to reach. Do not use any sheets or blankets that are too big for your bed. They should not hang down onto the floor. Have a firm chair that has side arms. You can use this for support while you get dressed. Do not have throw rugs and other things on the floor that can make you trip. What can I do in the kitchen? Clean up any spills right away. Avoid walking on wet floors. Keep items that you use a lot in easy-to-reach places. If you need to reach something above you, use a strong step stool that has a grab bar. Keep electrical cords out of the way. Do not use floor polish or wax that makes floors slippery. If you must use wax, use non-skid floor wax. Do not have throw rugs and other things on the floor that can make you trip. What can I do with my stairs? Do not leave any items on the stairs. Make sure that there are handrails on both sides of the stairs and use them. Fix handrails that are broken or loose. Make sure that handrails are as long as the stairways. Check any carpeting to make sure that it is firmly attached to the stairs. Fix any carpet that is loose or worn. Avoid having throw rugs at the top or bottom of the stairs. If you do have throw rugs, attach them to the floor with  carpet tape. Make sure that you have a light switch at the top of the stairs and the bottom of the stairs. If you do not have them, ask someone to add them for you. What else can I do to help prevent falls? Wear shoes that: Do not have high heels. Have rubber bottoms. Are comfortable and fit you well. Are closed at the toe. Do not wear sandals. If you use a stepladder: Make sure that it is fully opened. Do not climb a closed stepladder. Make sure that both sides of the stepladder are locked into place. Ask someone to hold it for you, if possible. Clearly mark and make sure that you can see: Any grab bars or handrails. First and last steps. Where the edge of each step is. Use tools that  help you move around (mobility aids) if they are needed. These include: Canes. Walkers. Scooters. Crutches. Turn on the lights when you go into a dark area. Replace any light bulbs as soon as they burn out. Set up your furniture so you have a clear path. Avoid moving your furniture around. If any of your floors are uneven, fix them. If there are any pets around you, be aware of where they are. Review your medicines with your doctor. Some medicines can make you feel dizzy. This can increase your chance of falling. Ask your doctor what other things that you can do to help prevent falls. This information is not intended to replace advice given to you by your health care provider. Make sure you discuss any questions you have with your health care provider. Document Released: 08/06/2009 Document Revised: 03/17/2016 Document Reviewed: 11/14/2014 Elsevier Interactive Patient Education  2017 [employer].

## 2022-05-30 NOTE — Progress Notes (Signed)
Virtual Visit via Telephone Note  I connected with  Jeremiah Meyer on 05/30/22 at  3:30 PM EDT by telephone and verified that I am speaking with the correct person using two identifiers.  Location: Patient: home Provider: [facility] Persons participating in the virtual visit: [place]   I discussed the limitations, risks, security and privacy concerns of performing an evaluation and management service by telephone and the availability of in person appointments. The patient expressed understanding and agreed to proceed.  Interactive audio and video telecommunications were attempted between this nurse and patient, however failed, due to patient having technical difficulties OR patient did not have access to video capability.  We continued and completed visit with audio only.  Some vital signs may be absent or patient reported.   [name], LPN  Subjective:   Jeremiah Meyer is a 71 y.o. male who presents for Medicare Annual/Subsequent preventive examination.  Review of Systems           Objective:    There were no vitals filed for this visit. There is no height or weight on file to calculate BMI.     02/17/2021    8:29 AM 05/11/2020    8:39 AM 05/02/2019    9:02 AM 04/18/2018   11:37 AM 02/01/2017    9:09 AM 04/04/2016   11:33 AM 01/27/2016   10:02 AM  Advanced Directives  Does Patient Have a Medical Advance Directive? No No Yes No No No No  Type of Comptroller;Living will      Copy of [place] in Chart?   No - copy requested      Would patient like information on creating a medical advance directive? No - Patient declined No - Patient declined  No - Patient declined Yes (ED - Information included in AVS)      Current Medications (verified) Outpatient Encounter Medications as of 05/30/2022  Medication Sig   aspirin 81 MG tablet Take 81 mg by mouth daily.   betamethasone dipropionate (DIPROLENE) 0.05  % cream Apply topically daily. (Patient taking differently: Apply topically daily. As needed for psoriasis flare ups)   Blood Glucose Monitoring Suppl (ONE TOUCH ULTRA 2) w/Device KIT Use to check blood sugar daily for type 2 diabetes   cyanocobalamin (,VITAMIN B-12,) 1000 MCG/ML injection INJECT 1 MLS MONTHLY AS DIRECTED   ferrous gluconate (FERGON) 324 MG tablet Take 1 tablet (324 mg total) by mouth daily with breakfast.   fluticasone (FLONASE) 50 MCG/ACT nasal spray Place 2 sprays into both nostrils daily.   gabapentin (NEURONTIN) 300 MG capsule TAKE 1 CAPSULE BY MOUTH THREE TIMES A DAY   glipiZIDE (GLUCOTROL XL) 2.5 MG 24 hr tablet TAKE 1 TABLET BY MOUTH EVERY DAY WITH BREAKFAST   JANUVIA 100 MG tablet TAKE 1 TABLET BY MOUTH EVERY DAY   lisinopril-hydrochlorothiazide (ZESTORETIC) 10-12.5 MG tablet TAKE 1 TABLET BY MOUTH EVERY DAY   metFORMIN (GLUCOPHAGE) 1000 MG tablet TAKE 1 TABLET BY MOUTH TWICE A DAY   Multiple Vitamin tablet Take 1 tablet by mouth daily.   ONETOUCH ULTRA test strip USE 1 STRIP TO CHECK BLOOD SUGAR ONCE DAILY E11.9   pramipexole (MIRAPEX) 0.5 MG tablet TAKE 1 TABLET BY MOUTH AT BEDTIME.   rosuvastatin (CRESTOR) 5 MG tablet TAKE 1 TABLET BY MOUTH EVERY DAY   Tdap (BOOSTRIX) 5-2.5-18.5 LF-MCG/0.5 injection TO BE ADMINISTERED BY PHARMACIST FOR IMMUNIZATION   meloxicam (MOBIC) 15 MG tablet Take  1 tablet (15 mg total) by mouth daily as needed. (Patient not taking: Reported on 05/30/2022)   rOPINIRole (REQUIP) 0.25 MG tablet  (Patient not taking: Reported on 05/30/2022)   rOPINIRole (REQUIP) 1 MG tablet  (Patient not taking: Reported on 05/30/2022)   simvastatin (ZOCOR) 20 MG tablet  (Patient not taking: Reported on 05/30/2022)   [DISCONTINUED] lisinopril-hydrochlorothiazide (ZESTORETIC) 10-12.5 MG tablet Take 1 tablet by mouth daily.   No facility-administered encounter medications on file as of 05/30/2022.    Allergies (verified) Simvastatin   History: Past Medical History:   Diagnosis Date   Anemia    Cyst, pilonidal, with abscess 06/12/2015   Diabetes mellitus without complication ([place])    type 2   Hemorrhoid 06/12/2015   Hip strain    Hyperlipidemia    Hypertension    Past Surgical History:  Procedure Laterality Date   COLONOSCOPY WITH PROPOFOL N/A 02/17/2021   Procedure: COLONOSCOPY WITH PROPOFOL;  Surgeon: Virgel Manifold, MD;  Location: [facility] ENDOSCOPY;  Service: Endoscopy;  Laterality: N/A;   LUNG SURGERY Left 1985   S/P RECURRENT PNEUMOTHORAX   VASECTOMY  1981   Family History  Problem Relation Age of Onset   Cancer Father        liver   Cancer Brother        stomach and intestine   Diabetes Brother    Crohn's disease Brother    Diabetes Brother    Prostate cancer Neg Hx    Breast cancer Neg Hx    Social History   Socioeconomic History   Marital status: Married    Spouse name: Raiford Noble   Number of children: 1   Years of education: Not on file   Highest education level: Associate degree: occupational, Hotel manager, or vocational program  Occupational History   Occupation: Retired    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  Tobacco Use   Smoking status: Former    Types: Cigarettes    Quit date: 10/24/1984    Years since quitting: 37.6   Smokeless tobacco: Never   Tobacco comments:    Quit 1986  Vaping Use   Vaping Use: Never used  Substance and Sexual Activity   Alcohol use: Yes    Comment: beer 1-2 monthly   Drug use: Yes    Types: Marijuana   Sexual activity: Not on file  Other Topics Concern   Not on file  Social History Narrative   Has one son who lives in [place] Strain: [place]  (10/27/2020)   Overall Financial Resource Strain (CARDIA)    Difficulty of Paying Living Expenses: Not very hard  Food Insecurity: No Food Insecurity (05/11/2020)   Hunger Vital Sign    Worried About Running Out of Food in the Last Year: Never true    [place] in the  Last Year: Never true  Transportation Needs: No Transportation Needs (10/27/2020)   PRAPARE - Hydrologist (Medical): No    Lack of Transportation (Non-Medical): No  Physical Activity: Sufficiently Active (05/30/2022)   Exercise Vital Sign    Days of Exercise per Week: 5 days    Minutes of Exercise per Session: 60 min  Stress: No Stress Concern Present (05/30/2022)   [place]    Feeling of Stress : Only a little  Social Connections: Moderately Isolated (05/11/2020)   Social Connection and Isolation Panel [NHANES]  Frequency of Communication with Friends and Family: Twice a week    Frequency of Social Gatherings with Friends and Family: Once a week    Attends Religious Services: Never    Marine scientist or Organizations: No    Attends Music therapist: Never    Marital Status: Married    Tobacco Counseling Counseling given: Not Answered Tobacco comments: Quit 1986   Clinical Intake:  Pre-visit preparation completed: Yes  Pain : No/denies pain     Nutritional Risks: None Diabetes: Yes CBG done?: No Did pt. bring in CBG monitor from home?: No  How often do you need to have someone help you when you read instructions, pamphlets, or other written materials from your doctor or pharmacy?: 1 - Never  Diabetic?yes Nutrition Risk Assessment:  Has the patient had any N/V/D within the last 2 months?  No  Does the patient have any non-healing wounds?  No  Has the patient had any unintentional weight loss or weight gain?  No   Diabetes:  Is the patient diabetic?  Yes  If diabetic, was a CBG obtained today?  No  Did the patient bring in their glucometer from home?  No  How often do you monitor your CBG's? Every morning.   Financial Strains and Diabetes Management:  Are you having any financial strains with the device, your supplies or your medication? No .  Does  the patient want to be seen by Chronic Care Management for management of their diabetes?  No  Would the patient like to be referred to a Nutritionist or for Diabetic Management?  No   Diabetic Exams:  Diabetic Eye Exam: Completed 03/15/22.  Pt has been advised about the importance in completing this exam.  Diabetic Foot Exam: Completed 05/03/19. Pt has been advised about the importance in completing this exam.   Interpreter Needed?: No  Information entered by :: Kirke Shaggy, LPN   Activities of Daily Living    05/27/2022   11:25 AM  In your present state of health, do you have any difficulty performing the following activities:  Hearing? 0  Vision? 0  Difficulty concentrating or making decisions? 0  Walking or climbing stairs? 0  Dressing or bathing? 0  Doing errands, shopping? 0  Preparing Food and eating ? N  Using the Toilet? N  In the past six months, have you accidently leaked urine? N  Do you have problems with loss of bowel control? N  Managing your Medications? N  Managing your Finances? N  Housekeeping or managing your Housekeeping? N    Patient Care Team: Birdie Sons, MD as PCP - General (Family Medicine) Anell Barr, OD as Consulting Physician (Optometry)  Indicate any recent Medical Services you may have received from other than [facility] providers in the past year (date may be approximate).     Assessment:   This is a routine wellness examination for [place].  Hearing/Vision screen No results found.  Dietary issues and exercise activities discussed:     Goals Addressed             This Visit's Progress    DIET - EAT MORE FRUITS AND VEGETABLES         Depression Screen    05/30/2022    3:34 PM 05/25/2021    9:06 AM 01/25/2021    8:23 AM 10/27/2020    9:23 AM 09/22/2020    8:49 AM 05/11/2020    8:37 AM 05/02/2019  9:02 AM  PHQ 2/9 Scores  PHQ - 2 Score 0 0 0 0 0 0 1  PHQ- 9 Score 0 1 0  0      Fall Risk    05/27/2022   11:25 AM 05/25/2021     9:05 AM 02/02/2021    9:34 AM 01/25/2021    8:23 AM 10/27/2020    9:36 AM  Fall Risk   Falls in the past year? 0 0 0 0 0  Number falls in past yr: 0 0 0 0   Injury with Fall? 0 0  0   Risk for fall due to :  No Fall Risks     Follow up  Falls evaluation completed  Falls evaluation completed Falls prevention discussed    FALL RISK PREVENTION PERTAINING TO THE HOME:  Any stairs in or around the home? Yes  If so, are there any without handrails? No  Home free of loose throw rugs in walkways, pet beds, electrical cords, etc? Yes  Adequate lighting in your home to reduce risk of falls? Yes   ASSISTIVE DEVICES UTILIZED TO PREVENT FALLS:  Life alert? No  Use of a cane, walker or w/c? No  Grab bars in the bathroom? No  Shower chair or bench in shower? Yes  Elevated toilet seat or a handicapped toilet? No   Cognitive Function:declined        05/25/2021    9:00 AM 02/01/2017    9:20 AM  6CIT Screen  What Year? 0 points 0 points  What month? 0 points 0 points  What time? 0 points 0 points  Count back from 20 0 points 0 points  Months in reverse 0 points 0 points  Repeat phrase 4 points 4 points  Total Score 4 points 4 points    Immunizations Immunization History  Administered Date(s) Administered   Influenza, High Dose Seasonal PF 06/19/2017, 08/14/2018, 06/28/2019, 07/27/2020, 07/27/2021   Influenza,inj,Quad PF,6+ Mos 08/25/2014, 09/04/2015   PFIZER Comirnaty(Gray Top)Covid-19 Tri-Sucrose Vaccine 02/23/2021   PFIZER(Purple Top)SARS-COV-2 Vaccination 12/13/2019, 01/03/2020, 07/27/2020   Pfizer Covid-19 Vaccine Bivalent Booster 54yr & up 07/27/2021   Pneumococcal Conjugate-13 01/27/2016   Pneumococcal Polysaccharide-23 09/09/2013, 05/03/2019   Td 10/24/1997   Tdap 12/06/2018   Zoster Recombinat (Shingrix) 05/03/2019, 01/24/2020    TDAP status: Up to date  Flu Vaccine status: Up to date  Pneumococcal vaccine status: Up to date  Covid-19 vaccine status: Completed  vaccines  Qualifies for Shingles Vaccine? Yes   Zostavax completed No   Shingrix Completed?: Yes  Screening Tests Health Maintenance  Topic Date Due   FOOT EXAM  05/02/2020   Diabetic kidney evaluation - GFR measurement  05/25/2022   INFLUENZA VACCINE  05/24/2022   HEMOGLOBIN A1C  08/16/2022   Diabetic kidney evaluation - Urine ACR  02/15/2023   OPHTHALMOLOGY EXAM  03/16/2023   TETANUS/TDAP  12/06/2028   COLONOSCOPY (Pts 45-413yrInsurance coverage will need to be confirmed)  02/18/2031   Pneumonia Vaccine 71Years old  Completed   COVID-19 Vaccine  Completed   Hepatitis C Screening  Completed   Zoster Vaccines- Shingrix  Completed   HPV VACCINES  Aged Out    Health Maintenance  Health Maintenance Due  Topic Date Due   FOOT EXAM  05/02/2020   Diabetic kidney evaluation - GFR measurement  05/25/2022   INFLUENZA VACCINE  05/24/2022    Colorectal cancer screening: Type of screening: Colonoscopy. Completed 02/17/21. Repeat every 10 years  Lung Cancer Screening: (Low Dose  CT Chest recommended if Age 59-80 years, 30 pack-year currently smoking OR have quit w/in 15years.) does not qualify.   Additional Screening:  Hepatitis C Screening: does qualify; Completed 08/22/16  Vision Screening: Recommended annual ophthalmology exams for early detection of glaucoma and other disorders of the eye. Is the patient up to date with their annual eye exam?  Yes  Who is the provider or what is the name of the office in which the patient attends annual eye exams? Dr.Woodard If pt is not established with a provider, would they like to be referred to a provider to establish care? No .   Dental Screening: Recommended annual dental exams for proper oral hygiene  Community Resource Referral / Chronic Care Management: CRR required this visit?  No   CCM required this visit?  No      Plan:     I have personally reviewed and noted the following in the patient's chart:   Medical and social  history Use of alcohol, tobacco or illicit drugs  Current medications and supplements including opioid prescriptions. Patient is not currently taking opioid prescriptions. Functional ability and status Nutritional status Physical activity Advanced directives List of other physicians Hospitalizations, surgeries, and ER visits in previous 12 months Vitals Screenings to include cognitive, depression, and falls Referrals and appointments  In addition, I have reviewed and discussed with patient certain preventive protocols, quality metrics, and best practice recommendations. A written personalized care plan for preventive services as well as general preventive health recommendations were provided to patient.     [name], LPN   02/24/2369   Nurse Notes: none

## 2022-07-05 ENCOUNTER — Telehealth: Payer: Self-pay

## 2022-07-05 NOTE — Telephone Encounter (Signed)
Copied from CRM [id number]. Topic: Appointment Scheduling - Scheduling Inquiry for Clinic >> Jun 28, 2022  9:36 AM Franchot Heidelberg wrote: Reason for CRM: Pt called and wants to know when PCP wants him to return for a follow up, please advise   Best contact: [phone]

## 2022-07-05 NOTE — Telephone Encounter (Signed)
He is due for follow up for diabetes sometime in the next 4-6 weeks

## 2022-07-05 NOTE — Telephone Encounter (Signed)
Patient is scheduled for the end of October.

## 2022-07-11 ENCOUNTER — Ambulatory Visit: Payer: Self-pay

## 2022-07-11 ENCOUNTER — Other Ambulatory Visit: Payer: Self-pay | Admitting: Family Medicine

## 2022-07-11 DIAGNOSIS — E119 Type 2 diabetes mellitus without complications: Secondary | ICD-10-CM

## 2022-07-11 DIAGNOSIS — G2581 Restless legs syndrome: Secondary | ICD-10-CM

## 2022-07-11 NOTE — Telephone Encounter (Signed)
Medication Refill - Medication: gabapentin (NEURONTIN) 300 MG capsule  Has the patient contacted their pharmacy? Yes.   ( Preferred Pharmacy (with phone number or street name):  [id number] - [place], [place] Phone:  [phone]  Fax:  [phone]     Has the patient been seen for an appointment in the last year OR does the patient have an upcoming appointment? Yes.    The patient has requested a change in quantity. He has requested a 90 day supply as he said it would just be simpler. Please assist patient further

## 2022-07-11 NOTE — Telephone Encounter (Signed)
Duplicate encounter opened, being addressed in TE.   Summary: Increase in quantity of Gabapentin   Patient called in stating he has his pharmacy send in refill requests for two medications. He did ask the pharmacy if they can change his gabapentin (NEURONTIN) 300 MG capsule to a 90 day supply. He takes 3 a day and says the 90 day supply would just be simpler. Please assist patient further

## 2022-07-12 NOTE — Telephone Encounter (Signed)
Unable to refill per protocol, last refill by provider 07/11/22. Will refuse duplicate request.  Requested Prescriptions  Pending Prescriptions Disp Refills  . gabapentin (NEURONTIN) 300 MG capsule 90 capsule 4     Neurology: Anticonvulsants - gabapentin Failed - 07/11/2022  3:51 PM      Failed - Cr in normal range and within 360 days    Creat  Date Value Ref Range Status  10/09/2017 1.01 0.70 - 1.25 mg/dL Final    Comment:    For patients >71 years of age, the reference limit for Creatinine is approximately 13% higher for people identified as African-American. .    Creatinine, Ser  Date Value Ref Range Status  05/25/2021 0.88 0.76 - 1.27 mg/dL Final   Creatinine, POC  Date Value Ref Range Status  08/22/2016 n/a mg/dL Final         Passed - Completed PHQ-2 or PHQ-9 in the last 360 days      Passed - Valid encounter within last 12 months    Recent Outpatient Visits          4 months ago Controlled type 2 diabetes mellitus without complication, without long-term current use of insulin ([place])   [facility] Birdie Sons, MD   9 months ago Controlled type 2 diabetes mellitus without complication, without long-term current use of insulin [facility])   [facility] Birdie Sons, MD   1 year ago Annual physical exam   [facility] Birdie Sons, MD   1 year ago Controlled type 2 diabetes mellitus without complication, without long-term current use of insulin [facility])   [facility] Birdie Sons, MD   1 year ago Iron deficiency   [place], Kirstie Peri, MD      Future Appointments            In 1 month [place], Kirstie Peri, MD [facility], [place]

## 2022-08-07 ENCOUNTER — Other Ambulatory Visit: Payer: Self-pay | Admitting: Family Medicine

## 2022-08-07 DIAGNOSIS — E611 Iron deficiency: Secondary | ICD-10-CM

## 2022-08-19 ENCOUNTER — Ambulatory Visit: Payer: PPO | Admitting: Family Medicine

## 2022-08-29 ENCOUNTER — Other Ambulatory Visit: Payer: Self-pay | Admitting: Family Medicine

## 2022-08-29 DIAGNOSIS — G2581 Restless legs syndrome: Secondary | ICD-10-CM

## 2022-09-12 ENCOUNTER — Ambulatory Visit: Payer: PPO | Admitting: Family Medicine

## 2022-09-21 ENCOUNTER — Encounter: Payer: Self-pay | Admitting: Family Medicine

## 2022-09-21 ENCOUNTER — Ambulatory Visit (INDEPENDENT_AMBULATORY_CARE_PROVIDER_SITE_OTHER): Payer: PPO | Admitting: Family Medicine

## 2022-09-21 VITALS — BP 129/77 | HR 56 | Temp 98.0°F | Wt 132.0 lb

## 2022-09-21 DIAGNOSIS — E538 Deficiency of other specified B group vitamins: Secondary | ICD-10-CM

## 2022-09-21 DIAGNOSIS — Z125 Encounter for screening for malignant neoplasm of prostate: Secondary | ICD-10-CM | POA: Diagnosis not present

## 2022-09-21 DIAGNOSIS — G2581 Restless legs syndrome: Secondary | ICD-10-CM

## 2022-09-21 DIAGNOSIS — Z1283 Encounter for screening for malignant neoplasm of skin: Secondary | ICD-10-CM

## 2022-09-21 DIAGNOSIS — E119 Type 2 diabetes mellitus without complications: Secondary | ICD-10-CM

## 2022-09-21 DIAGNOSIS — E611 Iron deficiency: Secondary | ICD-10-CM | POA: Diagnosis not present

## 2022-09-21 DIAGNOSIS — I10 Essential (primary) hypertension: Secondary | ICD-10-CM

## 2022-09-21 DIAGNOSIS — Z8349 Family history of other endocrine, nutritional and metabolic diseases: Secondary | ICD-10-CM | POA: Diagnosis not present

## 2022-09-21 NOTE — Progress Notes (Signed)
I,Roshena L Chambers,acting as a scribe for Lelon Huh, MD.,have documented all relevant documentation on the behalf of Lelon Huh, MD,as directed by  Lelon Huh, MD while in the presence of Lelon Huh, MD.   Established patient visit   Patient: Jeremiah Meyer   DOB: 02-Jan-1951   71 y.o. Male  MRN: [id number] Visit Date: 09/21/2022  Today's healthcare provider: Lelon Huh, MD   Chief Complaint  Patient presents with   Diabetes   Hyperlipidemia   Subjective    HPI  Diabetes Mellitus Type II, Follow-up  Lab Results  Component Value Date   HGBA1C 6.5 (H) 02/14/2022   HGBA1C 6.4 (A) 10/04/2021   HGBA1C 6.8 (H) 05/25/2021   Wt Readings from Last 3 Encounters:  09/21/22 132 lb (59.9 kg)  05/30/22 131 lb (59.4 kg)  02/14/22 131 lb (59.4 kg)   Last seen for diabetes 7 months ago.  Management since then includes continuing same medication. He reports good compliance with treatment. He is not having side effects.  Symptoms: No fatigue No foot ulcerations  No appetite changes No nausea  No paresthesia of the feet  No polydipsia  No polyuria No visual disturbances   No vomiting     Home blood sugar records: fasting range: 150-200  Usually better later in the day.   Episodes of hypoglycemia? No    Current insulin regiment: none Most Recent Eye Exam: 03/15/2022 Current exercise: none Current diet habits: in general, an "unhealthy" diet  Pertinent Labs: Lab Results  Component Value Date   NA 141 05/25/2021   K 4.2 05/25/2021   CREATININE 0.88 05/25/2021   EGFR 93 05/25/2021   MICROALBUR 20 10/30/2018   LABMICR <3.0 02/14/2022     ---------------------------------------------------------------------------------------------------  Lipid/Cholesterol, Follow-up  Last lipid panel Other pertinent labs  Lab Results  Component Value Date   CHOL 125 05/25/2021   HDL 45 05/25/2021   LDLCALC 62 05/25/2021   TRIG 92 05/25/2021   CHOLHDL 2.8  05/25/2021   Lab Results  Component Value Date   ALT 14 05/25/2021   AST 17 05/25/2021   PLT 204 05/25/2021   TSH 1.270 05/20/2020     He was last seen for this 1  year  ago.  Management since that visit includes continue same medication.  He reports good compliance with treatment. He is not having side effects.   Symptoms: No chest pain No chest pressure/discomfort  No dyspnea No lower extremity edema  No numbness or tingling of extremity No orthopnea  No palpitations No paroxysmal nocturnal dyspnea  No speech difficulty No syncope   Current diet: in general, an "unhealthy" diet Current exercise: none  The ASCVD Risk score (Arnett DK, et al., 2019) failed to calculate for the following reasons:   The valid total cholesterol range is 130 to 320 mg/dL  ---------------------------------------------------------------------------------------------------    Medications: Outpatient Medications Prior to Visit  Medication Sig   aspirin 81 MG tablet Take 81 mg by mouth daily.   betamethasone dipropionate (DIPROLENE) 0.05 % cream Apply topically daily. (Patient taking differently: Apply topically daily. As needed for psoriasis flare ups)   Blood Glucose Monitoring Suppl (ONE TOUCH ULTRA 2) w/Device KIT Use to check blood sugar daily for type 2 diabetes   cyanocobalamin (,VITAMIN B-12,) 1000 MCG/ML injection INJECT 1 MLS MONTHLY AS DIRECTED   ferrous gluconate (FERGON) 324 MG tablet TAKE 1 TABLET BY MOUTH DAILY WITH BREAKFAST   fluticasone (FLONASE) 50 MCG/ACT nasal spray Place 2 sprays  into both nostrils daily.   gabapentin (NEURONTIN) 300 MG capsule TAKE 1 CAPSULE BY MOUTH THREE TIMES A DAY   glipiZIDE (GLUCOTROL XL) 2.5 MG 24 hr tablet TAKE 1 TABLET BY MOUTH EVERY DAY WITH BREAKFAST   JANUVIA 100 MG tablet TAKE 1 TABLET BY MOUTH EVERY DAY   lisinopril-hydrochlorothiazide (ZESTORETIC) 10-12.5 MG tablet TAKE 1 TABLET BY MOUTH EVERY DAY   meloxicam (MOBIC) 15 MG tablet Take 1 tablet  (15 mg total) by mouth daily as needed.   metFORMIN (GLUCOPHAGE) 1000 MG tablet TAKE 1 TABLET BY MOUTH TWICE A DAY   Multiple Vitamin tablet Take 1 tablet by mouth daily.   ONETOUCH ULTRA test strip USE 1 STRIP TO CHECK BLOOD SUGAR ONCE DAILY E11.9   pramipexole (MIRAPEX) 0.5 MG tablet TAKE 1 TABLET BY MOUTH EVERYDAY AT BEDTIME   rosuvastatin (CRESTOR) 5 MG tablet TAKE 1 TABLET BY MOUTH EVERY DAY   No facility-administered medications prior to visit.    Review of Systems  Constitutional:  Negative for appetite change, chills and fever.  Respiratory:  Negative for chest tightness, shortness of breath and wheezing.   Cardiovascular:  Negative for chest pain and palpitations.  Gastrointestinal:  Negative for abdominal pain, nausea and vomiting.       Objective    BP 129/77 (BP Location: Left Arm, Patient Position: Sitting, Cuff Size: Normal)   Pulse (!) 56   Temp 98 F (36.7 C) (Oral)   Wt 132 lb (59.9 kg)   SpO2 100% Comment: room air  BMI 22.66 kg/m    Physical Exam  General appearance: Well developed, well nourished male, cooperative and in no acute distress Head: Normocephalic, without obvious abnormality, atraumatic Respiratory: Respirations even and unlabored, normal respiratory rate Extremities: All extremities are intact.  Skin: Skin color, texture, turgor normal. No rashes seen  Psych: Appropriate mood and affect. Neurologic: Mental status: Alert, oriented to person, place, and time, thought content appropriate.   Assessment & Plan     1. Controlled type 2 diabetes mellitus without complication, without long-term current use of insulin (HCC) Doing well current meds, but am sugars run high   - Comprehensive metabolic panel - Lipid panel - Hemoglobin A1c  2. Essential (primary) hypertension Well controlled.  Continue current medications.    3. B12 deficiency  - Vitamin B12  4. Restless leg Well controlled so long as he takes gabapentin and mirapex  consistently. Continues on iron supplements for iron deficiency.   5. Iron deficiency  - Iron, TIBC and Ferritin Panel - CBC  6. Family history of amyloidosis He reports his brother had biopsies done from recent carpal tunnel surgery and patient is wondering if any screening would be worthwhile. Will investigate literature on this.   7. Prostate cancer screening  - PSA Total (Reflex To Free) ([facility] only)  8. Skin cancer screening No specific moles that are concerning, but would like general skin exam. - Ambulatory referral to Dermatology      The entirety of the information documented in the History of Present Illness, Review of Systems and Physical Exam were personally obtained by me. Portions of this information were initially documented by the CMA and reviewed by me for thoroughness and accuracy.     Lelon Huh, MD  [facility] [phone] (phone) [phone] (fax)  [place]

## 2022-09-22 LAB — CBC
Hematocrit: 43.1 % (ref 37.5–51.0)
Hemoglobin: 14.5 g/dL (ref 13.0–17.7)
MCH: 30 pg (ref 26.6–33.0)
MCHC: 33.6 g/dL (ref 31.5–35.7)
MCV: 89 fL (ref 79–97)
Platelets: 211 10*3/uL (ref 150–450)
RBC: 4.84 x10E6/uL (ref 4.14–5.80)
RDW: 12.1 % (ref 11.6–15.4)
WBC: 4.6 10*3/uL (ref 3.4–10.8)

## 2022-09-22 LAB — PSA TOTAL (REFLEX TO FREE): Prostate Specific Ag, Serum: 1.7 ng/mL (ref 0.0–4.0)

## 2022-09-22 LAB — VITAMIN B12: Vitamin B-12: 376 pg/mL (ref 232–1245)

## 2022-09-22 LAB — COMPREHENSIVE METABOLIC PANEL
ALT: 16 IU/L (ref 0–44)
AST: 17 IU/L (ref 0–40)
Albumin/Globulin Ratio: 2 (ref 1.2–2.2)
Albumin: 4.3 g/dL (ref 3.8–4.8)
Alkaline Phosphatase: 70 IU/L (ref 44–121)
BUN/Creatinine Ratio: 11 (ref 10–24)
BUN: 10 mg/dL (ref 8–27)
Bilirubin Total: 0.7 mg/dL (ref 0.0–1.2)
CO2: 26 mmol/L (ref 20–29)
Calcium: 9 mg/dL (ref 8.6–10.2)
Chloride: 100 mmol/L (ref 96–106)
Creatinine, Ser: 0.92 mg/dL (ref 0.76–1.27)
Globulin, Total: 2.1 g/dL (ref 1.5–4.5)
Glucose: 210 mg/dL — ABNORMAL HIGH (ref 70–99)
Potassium: 4.6 mmol/L (ref 3.5–5.2)
Sodium: 140 mmol/L (ref 134–144)
Total Protein: 6.4 g/dL (ref 6.0–8.5)
eGFR: 89 mL/min/{1.73_m2} (ref >59)

## 2022-09-22 LAB — LIPID PANEL
Chol/HDL Ratio: 3.6 ratio (ref 0.0–5.0)
Cholesterol, Total: 126 mg/dL (ref 100–199)
HDL: 35 mg/dL — ABNORMAL LOW (ref >39)
LDL Chol Calc (NIH): 71 mg/dL (ref 0–99)
Triglycerides: 106 mg/dL (ref 0–149)
VLDL Cholesterol Cal: 20 mg/dL (ref 5–40)

## 2022-09-22 LAB — IRON,TIBC AND FERRITIN PANEL
Ferritin: 112 ng/mL (ref 30–400)
Iron Saturation: 31 % (ref 15–55)
Iron: 88 ug/dL (ref 38–169)
Total Iron Binding Capacity: 287 ug/dL (ref 250–450)
UIBC: 199 ug/dL (ref 111–343)

## 2022-09-22 LAB — HEMOGLOBIN A1C
Est. average glucose Bld gHb Est-mCnc: 157 mg/dL
Hgb A1c MFr Bld: 7.1 % — ABNORMAL HIGH (ref 4.8–5.6)

## 2022-10-26 ENCOUNTER — Other Ambulatory Visit: Payer: Self-pay | Admitting: Family Medicine

## 2022-10-26 DIAGNOSIS — E119 Type 2 diabetes mellitus without complications: Secondary | ICD-10-CM

## 2022-11-21 ENCOUNTER — Other Ambulatory Visit: Payer: Self-pay | Admitting: Family Medicine

## 2022-11-21 DIAGNOSIS — E119 Type 2 diabetes mellitus without complications: Secondary | ICD-10-CM

## 2022-11-21 DIAGNOSIS — G2581 Restless legs syndrome: Secondary | ICD-10-CM

## 2022-11-21 NOTE — Telephone Encounter (Signed)
Requested Prescriptions  Pending Prescriptions Disp Refills   gabapentin (NEURONTIN) 300 MG capsule [Pharmacy Med Name: GABAPENTIN 300 MG CAPSULE] 270 capsule 0    Sig: TAKE 1 CAPSULE BY MOUTH THREE TIMES A DAY     Neurology: Anticonvulsants - gabapentin Passed - 11/21/2022  8:32 AM      Passed - Cr in normal range and within 360 days    Creat  Date Value Ref Range Status  10/09/2017 1.01 0.70 - 1.25 mg/dL Final    Comment:    For patients >72 years of age, the reference limit for Creatinine is approximately 13% higher for people identified as African-American. .    Creatinine, Ser  Date Value Ref Range Status  09/21/2022 0.92 0.76 - 1.27 mg/dL Final   Creatinine, POC  Date Value Ref Range Status  08/22/2016 n/a mg/dL Final         Passed - Completed PHQ-2 or PHQ-9 in the last 360 days      Passed - Valid encounter within last 12 months    Recent Outpatient Visits           2 months ago Essential (primary) hypertension   [place] Birdie Sons, MD   9 months ago Controlled type 2 diabetes mellitus without complication, without long-term current use of insulin ([place])   [place], Donald E, MD   1 year ago Controlled type 2 diabetes mellitus without complication, without long-term current use of insulin ([place])   [place] Birdie Sons, MD   1 year ago Annual physical exam   [place], Donald E, MD   1 year ago Controlled type 2 diabetes mellitus without complication, without long-term current use of insulin ([place])   [place], Donald E, MD       Future Appointments             In 8 months Ralene Bathe, MD [place] 100 MG tablet [Pharmacy Med Name: JANUVIA 100 MG TABLET] 90 tablet 0    Sig: TAKE 1 TABLET (100 MG  TOTAL) BY MOUTH DAILY. PLEASE SCHEDULE OFFICE VISIT BEFORE ANY FUTURE REFILL.     Endocrinology:  Diabetes - DPP-4 Inhibitors Passed - 11/21/2022  8:32 AM      Passed - HBA1C is between 0 and 7.9 and within 180 days    Hgb A1c MFr Bld  Date Value Ref Range Status  09/21/2022 7.1 (H) 4.8 - 5.6 % Final    Comment:             Prediabetes: 5.7 - 6.4          Diabetes: >6.4          Glycemic control for adults with diabetes: <7.0          Passed - Cr in normal range and within 360 days    Creat  Date Value Ref Range Status  10/09/2017 1.01 0.70 - 1.25 mg/dL Final    Comment:    For patients >24 years of age, the reference limit for Creatinine is approximately 13% higher for people identified as African-American. .    Creatinine, Ser  Date Value Ref Range Status  09/21/2022 0.92 0.76 - 1.27 mg/dL Final   Creatinine, POC  Date Value Ref Range  Status  08/22/2016 n/a mg/dL Final         Passed - Valid encounter within last 6 months    Recent Outpatient Visits           2 months ago Essential (primary) hypertension   [place] Birdie Sons, MD   9 months ago Controlled type 2 diabetes mellitus without complication, without long-term current use of insulin ([place])   [place], Donald E, MD   1 year ago Controlled type 2 diabetes mellitus without complication, without long-term current use of insulin ([place])   [place], Donald E, MD   1 year ago Annual physical exam   [place], Donald E, MD   1 year ago Controlled type 2 diabetes mellitus without complication, without long-term current use of insulin [facility])   [place], Donald E, MD       Future Appointments             In 8 months Ralene Bathe, MD [place]

## 2022-12-02 ENCOUNTER — Other Ambulatory Visit: Payer: Self-pay | Admitting: Family Medicine

## 2022-12-02 DIAGNOSIS — E538 Deficiency of other specified B group vitamins: Secondary | ICD-10-CM

## 2022-12-02 NOTE — Telephone Encounter (Signed)
Requested Prescriptions  Refused Prescriptions Disp Refills   cyanocobalamin (VITAMIN B12) 1000 MCG/ML injection [Pharmacy Med Name: CYANOCOBALAMIN 1,000 MCG/ML VL] 3 mL 4    Sig: INJECT 1 MLS MONTHLY AS DIRECTED     Endocrinology:  Vitamins - Vitamin B12 Passed - 12/02/2022  4:29 AM      Passed - HCT in normal range and within 360 days    Hematocrit  Date Value Ref Range Status  09/21/2022 43.1 37.5 - 51.0 % Final         Passed - HGB in normal range and within 360 days    Hemoglobin  Date Value Ref Range Status  09/21/2022 14.5 13.0 - 17.7 g/dL Final         Passed - B12 Level in normal range and within 360 days    Vitamin B-12  Date Value Ref Range Status  09/21/2022 376 232 - 1,245 pg/mL Final         Passed - Valid encounter within last 12 months    Recent Outpatient Visits           2 months ago Essential (primary) hypertension   [place] Birdie Sons, MD   9 months ago Controlled type 2 diabetes mellitus without complication, without long-term current use of insulin ([place])   [place], Donald E, MD   1 year ago Controlled type 2 diabetes mellitus without complication, without long-term current use of insulin ([place])   [place] Birdie Sons, MD   1 year ago Annual physical exam   [place], Donald E, MD   1 year ago Controlled type 2 diabetes mellitus without complication, without long-term current use of insulin [facility])   [place] Birdie Sons, MD       Future Appointments             In 7 months Ralene Bathe, MD [place]

## 2022-12-21 DIAGNOSIS — M65321 Trigger finger, right index finger: Secondary | ICD-10-CM | POA: Diagnosis not present

## 2022-12-21 DIAGNOSIS — M65331 Trigger finger, right middle finger: Secondary | ICD-10-CM | POA: Diagnosis not present

## 2023-01-25 ENCOUNTER — Other Ambulatory Visit: Payer: Self-pay | Admitting: Family Medicine

## 2023-01-25 DIAGNOSIS — E538 Deficiency of other specified B group vitamins: Secondary | ICD-10-CM

## 2023-01-25 DIAGNOSIS — E119 Type 2 diabetes mellitus without complications: Secondary | ICD-10-CM

## 2023-01-26 NOTE — Telephone Encounter (Signed)
Requested Prescriptions  Pending Prescriptions Disp Refills   cyanocobalamin (VITAMIN B12) 1000 MCG/ML injection [Pharmacy Med Name: CYANOCOBALAMIN 1,000 MCG/ML VL] 3 mL 4    Sig: INJECT 1 MLS MONTHLY AS DIRECTED     Endocrinology:  Vitamins - Vitamin B12 Passed - 01/25/2023  8:13 PM      Passed - HCT in normal range and within 360 days    Hematocrit  Date Value Ref Range Status  09/21/2022 43.1 37.5 - 51.0 % Final         Passed - HGB in normal range and within 360 days    Hemoglobin  Date Value Ref Range Status  09/21/2022 14.5 13.0 - 17.7 g/dL Final         Passed - B12 Level in normal range and within 360 days    Vitamin B-12  Date Value Ref Range Status  09/21/2022 376 232 - 1,245 pg/mL Final         Passed - Valid encounter within last 12 months    Recent Outpatient Visits           4 months ago Essential (primary) hypertension   [place] Birdie Sons, MD   11 months ago Controlled type 2 diabetes mellitus without complication, without long-term current use of insulin ([place])   [place], Donald E, MD   1 year ago Controlled type 2 diabetes mellitus without complication, without long-term current use of insulin ([place])   [place] Birdie Sons, MD   1 year ago Annual physical exam   [place], Donald E, MD   2 years ago Controlled type 2 diabetes mellitus without complication, without long-term current use of insulin ([place])   [place], Donald E, MD       Future Appointments             In 5 months Ralene Bathe, MD [place] 100 MG tablet [Pharmacy Med Name: JANUVIA 100 MG TABLET] 90 tablet 0    Sig: TAKE 1 TABLET (100 MG TOTAL) BY MOUTH DAILY. PLEASE SCHEDULE OFFICE VISIT BEFORE ANY FUTURE REFILL.     Endocrinology:  Diabetes - DPP-4  Inhibitors Passed - 01/25/2023  8:13 PM      Passed - HBA1C is between 0 and 7.9 and within 180 days    Hgb A1c MFr Bld  Date Value Ref Range Status  09/21/2022 7.1 (H) 4.8 - 5.6 % Final    Comment:             Prediabetes: 5.7 - 6.4          Diabetes: >6.4          Glycemic control for adults with diabetes: <7.0          Passed - Cr in normal range and within 360 days    Creat  Date Value Ref Range Status  10/09/2017 1.01 0.70 - 1.25 mg/dL Final    Comment:    For patients >63 years of age, the reference limit for Creatinine is approximately 13% higher for people identified as African-American. .    Creatinine, Ser  Date Value Ref Range Status  09/21/2022 0.92 0.76 - 1.27 mg/dL Final   Creatinine, POC  Date Value Ref Range Status  08/22/2016 n/a mg/dL Final  Passed - Valid encounter within last 6 months    Recent Outpatient Visits           4 months ago Essential (primary) hypertension   [place] Birdie Sons, MD   11 months ago Controlled type 2 diabetes mellitus without complication, without long-term current use of insulin ([place])   [place], Donald E, MD   1 year ago Controlled type 2 diabetes mellitus without complication, without long-term current use of insulin ([place])   [place], Donald E, MD   1 year ago Annual physical exam   [place], Donald E, MD   2 years ago Controlled type 2 diabetes mellitus without complication, without long-term current use of insulin ([place])   [place], Donald E, MD       Future Appointments             In 5 months Ralene Bathe, MD [place]

## 2023-02-10 ENCOUNTER — Telehealth: Payer: Self-pay | Admitting: Family Medicine

## 2023-02-10 NOTE — Telephone Encounter (Signed)
Contacted [name] to schedule their annual wellness visit. Appointment made for 02/14/2023.  Thank you,  [facility] Support [facility] Direct dial  [phone]

## 2023-02-14 ENCOUNTER — Ambulatory Visit (INDEPENDENT_AMBULATORY_CARE_PROVIDER_SITE_OTHER): Payer: PPO

## 2023-02-14 VITALS — Ht 64.0 in | Wt 127.0 lb

## 2023-02-14 DIAGNOSIS — Z0000 Encounter for general adult medical examination without abnormal findings: Secondary | ICD-10-CM

## 2023-02-14 NOTE — Progress Notes (Signed)
I connected with  Jeremiah Meyer on 02/14/23 by a audio enabled telemedicine application and verified that I am speaking with the correct person using two identifiers.  Patient Location: Home  Provider Location: Office/Clinic  I discussed the limitations of evaluation and management by telemedicine. The patient expressed understanding and agreed to proceed.  Subjective:   Jeremiah Meyer is a 72 y.o. male who presents for Medicare Annual/Subsequent preventive examination.  Review of Systems     Cardiac Risk Factors include: advanced age (>31men, >6 women);diabetes mellitus;dyslipidemia;hypertension;male gender;sedentary lifestyle     Objective:    Today's Vitals   02/14/23 1035  Weight: 127 lb (57.6 kg)  Height: 5\' 4"  (1.626 m)   Body mass index is 21.8 kg/m.     02/14/2023   10:44 AM 05/30/2022    3:36 PM 02/17/2021    8:29 AM 05/11/2020    8:39 AM 05/02/2019    9:02 AM 04/18/2018   11:37 AM 02/01/2017    9:09 AM  Advanced Directives  Does Patient Have a Medical Advance Directive? No No No No Yes No No  Type of Agricultural consultant;Living will    Copy of Healthcare Power of Attorney in Chart?     No - copy requested    Would patient like information on creating a medical advance directive?  No - Patient declined No - Patient declined No - Patient declined  No - Patient declined Yes (ED - Information included in AVS)    Current Medications (verified) Outpatient Encounter Medications as of 02/14/2023  Medication Sig   aspirin 81 MG tablet Take 81 mg by mouth daily.   betamethasone dipropionate (DIPROLENE) 0.05 % cream Apply topically daily. (Patient taking differently: Apply topically daily. As needed for psoriasis flare ups)   Blood Glucose Monitoring Suppl (ONE TOUCH ULTRA 2) w/Device KIT Use to check blood sugar daily for type 2 diabetes   cyanocobalamin (VITAMIN B12) 1000 MCG/ML injection INJECT 1 MLS MONTHLY AS DIRECTED   ferrous gluconate  (FERGON) 324 MG tablet TAKE 1 TABLET BY MOUTH DAILY WITH BREAKFAST   fluticasone (FLONASE) 50 MCG/ACT nasal spray Place 2 sprays into both nostrils daily.   gabapentin (NEURONTIN) 300 MG capsule TAKE 1 CAPSULE BY MOUTH THREE TIMES A DAY   glipiZIDE (GLUCOTROL XL) 2.5 MG 24 hr tablet TAKE 1 TABLET BY MOUTH EVERY DAY WITH BREAKFAST   JANUVIA 100 MG tablet TAKE 1 TABLET (100 MG TOTAL) BY MOUTH DAILY. PLEASE SCHEDULE OFFICE VISIT BEFORE ANY FUTURE REFILL.   lisinopril-hydrochlorothiazide (ZESTORETIC) 10-12.5 MG tablet TAKE 1 TABLET BY MOUTH EVERY DAY   meloxicam (MOBIC) 15 MG tablet Take 1 tablet (15 mg total) by mouth daily as needed.   metFORMIN (GLUCOPHAGE) 1000 MG tablet TAKE 1 TABLET BY MOUTH TWICE A DAY   Multiple Vitamin tablet Take 1 tablet by mouth daily.   ONETOUCH ULTRA test strip USE 1 STRIP TO CHECK BLOOD SUGAR ONCE DAILY E11.9   pramipexole (MIRAPEX) 0.5 MG tablet TAKE 1 TABLET BY MOUTH EVERYDAY AT BEDTIME   rosuvastatin (CRESTOR) 5 MG tablet TAKE 1 TABLET BY MOUTH EVERY DAY   No facility-administered encounter medications on file as of 02/14/2023.    Allergies (verified) Simvastatin   History: Past Medical History:  Diagnosis Date   Anemia    Cyst, pilonidal, with abscess 06/12/2015   Diabetes mellitus without complication    type 2   Hemorrhoid 06/12/2015   Hip strain    Hyperlipidemia  Hypertension    Past Surgical History:  Procedure Laterality Date   COLONOSCOPY WITH PROPOFOL N/A 02/17/2021   Procedure: COLONOSCOPY WITH PROPOFOL;  Surgeon: Pasty Spillers, MD;  Location: [facility] ENDOSCOPY;  Service: Endoscopy;  Laterality: N/A;   LUNG SURGERY Left 1985   S/P RECURRENT PNEUMOTHORAX   VASECTOMY  1981   Family History  Problem Relation Age of Onset   Cancer Father        liver   Cancer Brother        stomach and intestine   Diabetes Brother    Crohn's disease Brother    Diabetes Brother    Prostate cancer Neg Hx    Breast cancer Neg Hx    Social History    Socioeconomic History   Marital status: Married    Spouse name: Diamond Nickel   Number of children: 1   Years of education: Not on file   Highest education level: Associate degree: occupational, Scientist, product/process development, or vocational program  Occupational History   Occupation: Retired    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)  Tobacco Use   Smoking status: Former    Types: Cigarettes    Quit date: 10/24/1984    Years since quitting: 38.3   Smokeless tobacco: Never   Tobacco comments:    Quit 1986  Vaping Use   Vaping Use: Never used  Substance and Sexual Activity   Alcohol use: Yes    Comment: beer 1-2 monthly   Drug use: Yes    Types: Marijuana   Sexual activity: Not on file  Other Topics Concern   Not on file  Social History Narrative   Has one son who lives in [place]   Social Determinants of Health   Financial Resource Strain: Low Risk  (02/14/2023)   Overall Financial Resource Strain (CARDIA)    Difficulty of Paying Living Expenses: Not hard at all  Food Insecurity: No Food Insecurity (02/14/2023)   Hunger Vital Sign    Worried About Running Out of Food in the Last Year: Never true    Ran Out of Food in the Last Year: Never true  Transportation Needs: No Transportation Needs (02/14/2023)   PRAPARE - Administrator, Civil Service (Medical): No    Lack of Transportation (Non-Medical): No  Physical Activity: Inactive (02/14/2023)   Exercise Vital Sign    Days of Exercise per Week: 0 days    Minutes of Exercise per Session: 0 min  Stress: No Stress Concern Present (02/14/2023)   [employer] of Occupational Health - Occupational Stress Questionnaire    Feeling of Stress : Not at all  Social Connections: Socially Isolated (02/14/2023)   Social Connection and Isolation Panel [NHANES]    Frequency of Communication with Friends and Family: Twice a week    Frequency of Social Gatherings with Friends and Family: Never    Attends Religious Services: Never     Database administrator or Organizations: No    Attends Engineer, structural: Never    Marital Status: Married    Tobacco Counseling Counseling given: Not Answered Tobacco comments: Quit 1986  Clinical Intake:  Pre-visit preparation completed: Yes  Pain : No/denies pain   BMI - recorded: 21.8 Nutritional Status: BMI of 19-24  Normal Nutritional Risks: None Diabetes: Yes CBG done?: Yes (150 this morning at home) CBG resulted in Enter/ Edit results?: No Did pt. bring in CBG monitor from home?: No  How often do you need to have someone help you when  you read instructions, pamphlets, or other written materials from your doctor or pharmacy?: 1 - Never  Diabetic?yes  Interpreter Needed?: No  Comments: lives with wife Information entered by :: B.French Kendra,LPN  Activities of Daily Living    02/14/2023   10:44 AM 05/30/2022    3:37 PM  In your present state of health, do you have any difficulty performing the following activities:  Hearing? 0 0  Vision? 0 0  Difficulty concentrating or making decisions? 0 0  Walking or climbing stairs? 0 0  Dressing or bathing? 0 0  Doing errands, shopping? 0 0  Preparing Food and eating ? N N  Using the Toilet? N N  In the past six months, have you accidently leaked urine? N N  Do you have problems with loss of bowel control? N N  Managing your Medications? N N  Managing your Finances? N N  Housekeeping or managing your Housekeeping? N N    Patient Care Team: Malva Limes, MD as PCP - General (Family Medicine) Isla Pence, OD as Consulting Physician (Optometry)  Indicate any recent Medical Services you may have received from other than [facility] providers in the past year (date may be approximate).     Assessment:   This is a routine wellness examination for [place].  Hearing/Vision screen Hearing Screening - Comments:: Adequate hearing Vision Screening - Comments:: Adequate vision w/glasses Dr Clydene Pugh  Dietary issues  and exercise activities discussed: Current Exercise Habits: The patient has a physically strenuous job, but has no regular exercise apart from work., Exercise limited by: neurologic condition(s)   Goals Addressed             This Visit's Progress    DIET - EAT MORE FRUITS AND VEGETABLES   Not on track    DIET - INCREASE WATER INTAKE   On track    Recommend increasing water intake to 6-8 glasses a day.      Exercise 150 minutes per week (moderate activity)   Not on track      Depression Screen    02/14/2023   10:41 AM 09/21/2022    8:30 AM 05/30/2022    3:34 PM 05/25/2021    9:06 AM 01/25/2021    8:23 AM 10/27/2020    9:23 AM 09/22/2020    8:49 AM  PHQ 2/9 Scores  PHQ - 2 Score 0 0 0 0 0 0 0  PHQ- 9 Score  0 0 1 0  0    Fall Risk    02/14/2023   10:37 AM 05/30/2022    3:37 PM 05/27/2022   11:25 AM 05/25/2021    9:05 AM 02/02/2021    9:34 AM  Fall Risk   Falls in the past year? 0 0 0 0 0  Number falls in past yr: 0 0 0 0 0  Injury with Fall? 0 0 0 0   Risk for fall due to : No Fall Risks No Fall Risks  No Fall Risks   Follow up Falls prevention discussed;Education provided Falls evaluation completed  Falls evaluation completed     FALL RISK PREVENTION PERTAINING TO THE HOME:  Any stairs in or around the home? Yes  If so, are there any without handrails? Yes  Home free of loose throw rugs in walkways, pet beds, electrical cords, etc? Yes  Adequate lighting in your home to reduce risk of falls? Yes   ASSISTIVE DEVICES UTILIZED TO PREVENT FALLS:  Life alert? No  Use of a  cane, walker or w/c? No  Grab bars in the bathroom? Yes  Shower chair or bench in shower? Yes does not use Elevated toilet seat or a handicapped toilet? No   Cognitive Function:        02/14/2023   10:48 AM 05/25/2021    9:00 AM 02/01/2017    9:20 AM  6CIT Screen  What Year? 0 points 0 points 0 points  What month? 0 points 0 points 0 points  What time? 0 points 0 points 0 points  Count back from 20 0  points 0 points 0 points  Months in reverse 0 points 0 points 0 points  Repeat phrase 0 points 4 points 4 points  Total Score 0 points 4 points 4 points    Immunizations Immunization History  Administered Date(s) Administered   COVID-19, mRNA, vaccine(Comirnaty)12 years and older 09/17/2022   Fluad Quad(high Dose 65+) 09/17/2022   Influenza, High Dose Seasonal PF 06/19/2017, 08/14/2018, 06/28/2019, 07/27/2020, 07/27/2021   Influenza,inj,Quad PF,6+ Mos 08/25/2014, 09/04/2015   PFIZER Comirnaty(Gray Top)Covid-19 Tri-Sucrose Vaccine 02/23/2021   PFIZER(Purple Top)SARS-COV-2 Vaccination 12/13/2019, 01/03/2020, 07/27/2020   Pfizer Covid-19 Vaccine Bivalent Booster 37yrs & up 07/27/2021   Pneumococcal Conjugate-13 01/27/2016   Pneumococcal Polysaccharide-23 09/09/2013, 05/03/2019   Respiratory Syncytial Virus Vaccine,Recomb Aduvanted(Arexvy) 09/17/2022   Td 10/24/1997   Tdap 12/06/2018   Zoster Recombinat (Shingrix) 05/03/2019, 01/24/2020    TDAP status: Up to date  Flu Vaccine status: Up to date  Pneumococcal vaccine status: Up to date  Covid-19 vaccine status: Completed vaccines  Qualifies for Shingles Vaccine? Yes   Zostavax completed Yes   Shingrix Completed?: Yes  Screening Tests Health Maintenance  Topic Date Due   Diabetic kidney evaluation - Urine ACR  02/15/2023   COVID-19 Vaccine (7 - 2023-24 season) 07/25/2023 (Originally 11/12/2022)   OPHTHALMOLOGY EXAM  03/16/2023   HEMOGLOBIN A1C  03/22/2023   INFLUENZA VACCINE  05/25/2023   Diabetic kidney evaluation - eGFR measurement  09/22/2023   Medicare Annual Wellness (AWV)  02/14/2024   DTaP/Tdap/Td (3 - Td or Tdap) 12/06/2028   COLONOSCOPY (Pts 45-36yrs Insurance coverage will need to be confirmed)  02/18/2031   Pneumonia Vaccine 51+ Years old  Completed   Hepatitis C Screening  Completed   Zoster Vaccines- Shingrix  Completed   HPV VACCINES  Aged Out    Health Maintenance  Health Maintenance Due  Topic Date  Due   Diabetic kidney evaluation - Urine ACR  02/15/2023    Colorectal cancer screening: Type of screening: Colonoscopy. Completed no. Repeat every 5-10 years  Lung Cancer Screening: (Low Dose CT Chest recommended if Age 66-80 years, 30 pack-year currently smoking OR have quit w/in 15years.) does not qualify.   Lung Cancer Screening Referral: no  Additional Screening:  Hepatitis C Screening: does not qualify; Completed yes  Vision Screening: Recommended annual ophthalmology exams for early detection of glaucoma and other disorders of the eye. Is the patient up to date with their annual eye exam?  Yes  Who is the provider or what is the name of the office in which the patient attends annual eye exams? Dr Clydene Pugh If pt is not established with a provider, would they like to be referred to a provider to establish care? No .   Dental Screening: Recommended annual dental exams for proper oral hygiene  Community Resource Referral / Chronic Care Management: CRR required this visit?  No   CCM required this visit?  No    Plan:  I have personally reviewed and noted the following in the patient's chart:   Medical and social history Use of alcohol, tobacco or illicit drugs  Current medications and supplements including opioid prescriptions. Patient is not currently taking opioid prescriptions. Functional ability and status Nutritional status Physical activity Advanced directives List of other physicians Hospitalizations, surgeries, and ER visits in previous 12 months Vitals Screenings to include cognitive, depression, and falls Referrals and appointments  In addition, I have reviewed and discussed with patient certain preventive protocols, quality metrics, and best practice recommendations. A written personalized care plan for preventive services as well as general preventive health recommendations were provided to patient.     Sue Lush, LPN   [id number]   Nurse Notes:  The patient states he is  doing well and has no concerns or questions at this time.

## 2023-02-14 NOTE — Patient Instructions (Signed)
Jeremiah Meyer , Thank you for taking time to come for your Medicare Wellness Visit. I appreciate your ongoing commitment to your health goals. Please review the following plan we discussed and let me know if I can assist you in the future.   These are the goals we discussed:  Goals      DIET - EAT MORE FRUITS AND VEGETABLES     DIET - INCREASE WATER INTAKE     Recommend increasing water intake to 6-8 glasses a day.      Exercise 150 minutes per week (moderate activity)        This is a list of the screening recommended for you and due dates:  Health Maintenance  Topic Date Due   Yearly kidney health urinalysis for diabetes  02/15/2023   COVID-19 Vaccine (7 - 2023-24 season) 07/25/2023*   Eye exam for diabetics  03/16/2023   Hemoglobin A1C  03/22/2023   Flu Shot  05/25/2023   Yearly kidney function blood test for diabetes  09/22/2023   Medicare Annual Wellness Visit  02/14/2024   DTaP/Tdap/Td vaccine (3 - Td or Tdap) 12/06/2028   Colon Cancer Screening  02/18/2031   Pneumonia Vaccine  Completed   Hepatitis C Screening: USPSTF Recommendation to screen - Ages 13-79 yo.  Completed   Zoster (Shingles) Vaccine  Completed   HPV Vaccine  Aged Out  *Topic was postponed. The date shown is not the original due date.    Advanced directives: no  Conditions/risks identified: none  Next appointment: Follow up in one year for your annual wellness visit. 02/19/2024 :45am telephone  Preventive Care 65 Years and Older, Male  Preventive care refers to lifestyle choices and visits with your health care provider that can promote health and wellness. What does preventive care include? A yearly physical exam. This is also called an annual well check. Dental exams once or twice a year. Routine eye exams. Ask your health care provider how often you should have your eyes checked. Personal lifestyle choices, including: Daily care of your teeth and gums. Regular physical activity. Eating a  healthy diet. Avoiding tobacco and drug use. Limiting alcohol use. Practicing safe sex. Taking low doses of aspirin every day. Taking vitamin and mineral supplements as recommended by your health care provider. What happens during an annual well check? The services and screenings done by your health care provider during your annual well check will depend on your age, overall health, lifestyle risk factors, and family history of disease. Counseling  Your health care provider may ask you questions about your: Alcohol use. Tobacco use. Drug use. Emotional well-being. Home and relationship well-being. Sexual activity. Eating habits. History of falls. Memory and ability to understand (cognition). Work and work Astronomer. Screening  You may have the following tests or measurements: Height, weight, and BMI. Blood pressure. Lipid and cholesterol levels. These may be checked every 5 years, or more frequently if you are over 45 years old. Skin check. Lung cancer screening. You may have this screening every year starting at age 15 if you have a 30-pack-year history of smoking and currently smoke or have quit within the past 15 years. Fecal occult blood test (FOBT) of the stool. You may have this test every year starting at age 48. Flexible sigmoidoscopy or colonoscopy. You may have a sigmoidoscopy every 5 years or a colonoscopy every 10 years starting at age 87. Prostate cancer screening. Recommendations will vary depending on your family history and other risks. Hepatitis C blood  test. Hepatitis B blood test. Sexually transmitted disease (STD) testing. Diabetes screening. This is done by checking your blood sugar (glucose) after you have not eaten for a while (fasting). You may have this done every 1-3 years. Abdominal aortic aneurysm (AAA) screening. You may need this if you are a current or former smoker. Osteoporosis. You may be screened starting at age 81 if you are at high risk. Talk  with your health care provider about your test results, treatment options, and if necessary, the need for more tests. Vaccines  Your health care provider may recommend certain vaccines, such as: Influenza vaccine. This is recommended every year. Tetanus, diphtheria, and acellular pertussis (Tdap, Td) vaccine. You may need a Td booster every 10 years. Zoster vaccine. You may need this after age 47. Pneumococcal 13-valent conjugate (PCV13) vaccine. One dose is recommended after age 62. Pneumococcal polysaccharide (PPSV23) vaccine. One dose is recommended after age 59. Talk to your health care provider about which screenings and vaccines you need and how often you need them. This information is not intended to replace advice given to you by your health care provider. Make sure you discuss any questions you have with your health care provider. Document Released: 11/06/2015 Document Revised: 06/29/2016 Document Reviewed: 08/11/2015 Elsevier Interactive Patient Education  2017 [place] Prevention in the Home Falls can cause injuries. They can happen to people of all ages. There are many things you can do to make your home safe and to help prevent falls. What can I do on the outside of my home? Regularly fix the edges of walkways and driveways and fix any cracks. Remove anything that might make you trip as you walk through a door, such as a raised step or threshold. Trim any bushes or trees on the path to your home. Use bright outdoor lighting. Clear any walking paths of anything that might make someone trip, such as rocks or tools. Regularly check to see if handrails are loose or broken. Make sure that both sides of any steps have handrails. Any raised decks and porches should have guardrails on the edges. Have any leaves, snow, or ice cleared regularly. Use sand or salt on walking paths during winter. Clean up any spills in your garage right away. This includes oil or grease  spills. What can I do in the bathroom? Use night lights. Install grab bars by the toilet and in the tub and shower. Do not use towel bars as grab bars. Use non-skid mats or decals in the tub or shower. If you need to sit down in the shower, use a plastic, non-slip stool. Keep the floor dry. Clean up any water that spills on the floor as soon as it happens. Remove soap buildup in the tub or shower regularly. Attach bath mats securely with double-sided non-slip rug tape. Do not have throw rugs and other things on the floor that can make you trip. What can I do in the bedroom? Use night lights. Make sure that you have a light by your bed that is easy to reach. Do not use any sheets or blankets that are too big for your bed. They should not hang down onto the floor. Have a firm chair that has side arms. You can use this for support while you get dressed. Do not have throw rugs and other things on the floor that can make you trip. What can I do in the kitchen? Clean up any spills right away. Avoid walking on wet  floors. Keep items that you use a lot in easy-to-reach places. If you need to reach something above you, use a strong step stool that has a grab bar. Keep electrical cords out of the way. Do not use floor polish or wax that makes floors slippery. If you must use wax, use non-skid floor wax. Do not have throw rugs and other things on the floor that can make you trip. What can I do with my stairs? Do not leave any items on the stairs. Make sure that there are handrails on both sides of the stairs and use them. Fix handrails that are broken or loose. Make sure that handrails are as long as the stairways. Check any carpeting to make sure that it is firmly attached to the stairs. Fix any carpet that is loose or worn. Avoid having throw rugs at the top or bottom of the stairs. If you do have throw rugs, attach them to the floor with carpet tape. Make sure that you have a light switch at the  top of the stairs and the bottom of the stairs. If you do not have them, ask someone to add them for you. What else can I do to help prevent falls? Wear shoes that: Do not have high heels. Have rubber bottoms. Are comfortable and fit you well. Are closed at the toe. Do not wear sandals. If you use a stepladder: Make sure that it is fully opened. Do not climb a closed stepladder. Make sure that both sides of the stepladder are locked into place. Ask someone to hold it for you, if possible. Clearly mark and make sure that you can see: Any grab bars or handrails. First and last steps. Where the edge of each step is. Use tools that help you move around (mobility aids) if they are needed. These include: Canes. Walkers. Scooters. Crutches. Turn on the lights when you go into a dark area. Replace any light bulbs as soon as they burn out. Set up your furniture so you have a clear path. Avoid moving your furniture around. If any of your floors are uneven, fix them. If there are any pets around you, be aware of where they are. Review your medicines with your doctor. Some medicines can make you feel dizzy. This can increase your chance of falling. Ask your doctor what other things that you can do to help prevent falls. This information is not intended to replace advice given to you by your health care provider. Make sure you discuss any questions you have with your health care provider. Document Released: 08/06/2009 Document Revised: 03/17/2016 Document Reviewed: 11/14/2014 Elsevier Interactive Patient Education  2017 [employer].

## 2023-02-28 ENCOUNTER — Other Ambulatory Visit: Payer: Self-pay | Admitting: Family Medicine

## 2023-02-28 DIAGNOSIS — G2581 Restless legs syndrome: Secondary | ICD-10-CM

## 2023-02-28 NOTE — Telephone Encounter (Signed)
Requested Prescriptions  Pending Prescriptions Disp Refills   gabapentin (NEURONTIN) 300 MG capsule [Pharmacy Med Name: GABAPENTIN 300 MG CAPSULE] 270 capsule 0    Sig: TAKE 1 CAPSULE BY MOUTH THREE TIMES A DAY     Neurology: Anticonvulsants - gabapentin Passed - 02/28/2023  3:14 PM      Passed - Cr in normal range and within 360 days    Creat  Date Value Ref Range Status  10/09/2017 1.01 0.70 - 1.25 mg/dL Final    Comment:    For patients >72 years of age, the reference limit for Creatinine is approximately 13% higher for people identified as African-American. .    Creatinine, Ser  Date Value Ref Range Status  09/21/2022 0.92 0.76 - 1.27 mg/dL Final   Creatinine, POC  Date Value Ref Range Status  08/22/2016 n/a mg/dL Final         Passed - Completed PHQ-2 or PHQ-9 in the last 360 days      Passed - Valid encounter within last 12 months    Recent Outpatient Visits           5 months ago Essential (primary) hypertension   [facility] Malva Limes, MD   1 year ago Controlled type 2 diabetes mellitus without complication, without long-term current use of insulin (HCC)   [place] [facility] Malva Limes, MD   1 year ago Controlled type 2 diabetes mellitus without complication, without long-term current use of insulin (HCC)   [facility], MD   1 year ago Annual physical exam   [place] [facility] Malva Limes, MD   2 years ago Controlled type 2 diabetes mellitus without complication, without long-term current use of insulin (HCC)   [place] [place] [facility] Malva Limes, MD       Future Appointments             In 4 months Deirdre Evener, MD [facility]             glipiZIDE (GLUCOTROL XL) 2.5 MG 24 hr tablet [Pharmacy Med Name: GLIPIZIDE ER 2.5 MG TABLET] 90 tablet 4    Sig: TAKE 1 TABLET BY MOUTH EVERY DAY  WITH BREAKFAST     Endocrinology:  Diabetes - Sulfonylureas Passed - 02/28/2023  3:14 PM      Passed - HBA1C is between 0 and 7.9 and within 180 days    Hgb A1c MFr Bld  Date Value Ref Range Status  09/21/2022 7.1 (H) 4.8 - 5.6 % Final    Comment:             Prediabetes: 5.7 - 6.4          Diabetes: >6.4          Glycemic control for adults with diabetes: <7.0          Passed - Cr in normal range and within 360 days    Creat  Date Value Ref Range Status  10/09/2017 1.01 0.70 - 1.25 mg/dL Final    Comment:    For patients >18 years of age, the reference limit for Creatinine is approximately 13% higher for people identified as African-American. .    Creatinine, Ser  Date Value Ref Range Status  09/21/2022 0.92 0.76 - 1.27 mg/dL Final   Creatinine, POC  Date Value Ref Range Status  08/22/2016 n/a mg/dL Final  Passed - Valid encounter within last 6 months    Recent Outpatient Visits           5 months ago Essential (primary) hypertension   [place] [facility] Malva Limes, MD   1 year ago Controlled type 2 diabetes mellitus without complication, without long-term current use of insulin (HCC)   [place] [facility] Malva Limes, MD   1 year ago Controlled type 2 diabetes mellitus without complication, without long-term current use of insulin (HCC)   [place] [facility] Malva Limes, MD   1 year ago Annual physical exam   [facility] Malva Limes, MD   2 years ago Controlled type 2 diabetes mellitus without complication, without long-term current use of insulin (HCC)   [place] [facility] Malva Limes, MD       Future Appointments             In 4 months Deirdre Evener, MD [facility]

## 2023-03-03 ENCOUNTER — Other Ambulatory Visit: Payer: Self-pay | Admitting: Pharmacist

## 2023-03-03 ENCOUNTER — Encounter: Payer: Self-pay | Admitting: Pharmacist

## 2023-03-03 NOTE — Patient Instructions (Addendum)
It was a pleasure speaking with you today. I reached [facility] at [phone] and confirmed that pharmacy has an active prescription for you for rosuvastatin 5 mg. Pharmacy is filling this for you today.  Please let me know if you have any further question/concerns about your medications in the future!  Estelle Grumbles, PharmD, [facility] [phone]

## 2023-03-03 NOTE — Progress Notes (Signed)
Pharmacy Quality Measure Review  This patient is appearing on the insurance-provided list for being at risk of failing the adherence measure for Statin Use in Persons with Diabetes (SUPD) medications this calendar year.   Per dispensing record in chart, patient last filled prescription for rosuvastatin 5 mg on 06/29/2022 for 90 day supply.  Outreach to patient today by telephone.  Patient reports that when he last ran out of rosuvastatin, prescription did not have refills and instead started taking simvastatin 20 mg that he still had from when he was previously on this medication (but now only has 2 days of this medication remaining). Note patient previously stopped taking simvastatin related to back/leg pain, but today shares that he has found does not believe pain was related to statin. Reports leg pain now controlled with taking gabapentin.  - From review of chart, note PCP sent a renewal of rosuvastatin 5 mg Rx to pharmacy for patient on 07/11/2022 for 90 day supply with 4 refills. - [facility]. [facility] will [facility]atin for patient today.   Plan No further intervention needed. Patient denies further medication questions/concerns today.  Estelle Grumbles, PharmD, [facility] [phone]

## 2023-04-23 ENCOUNTER — Other Ambulatory Visit: Payer: Self-pay | Admitting: Family Medicine

## 2023-04-23 DIAGNOSIS — E119 Type 2 diabetes mellitus without complications: Secondary | ICD-10-CM

## 2023-04-28 ENCOUNTER — Other Ambulatory Visit: Payer: Self-pay | Admitting: Family Medicine

## 2023-04-28 DIAGNOSIS — E119 Type 2 diabetes mellitus without complications: Secondary | ICD-10-CM

## 2023-05-18 ENCOUNTER — Other Ambulatory Visit: Payer: Self-pay | Admitting: Family Medicine

## 2023-05-18 DIAGNOSIS — G2581 Restless legs syndrome: Secondary | ICD-10-CM

## 2023-05-18 DIAGNOSIS — E119 Type 2 diabetes mellitus without complications: Secondary | ICD-10-CM

## 2023-05-18 NOTE — Telephone Encounter (Signed)
Requested Prescriptions  Pending Prescriptions Disp Refills   sitaGLIPtin (JANUVIA) 100 MG tablet [Pharmacy Med Name: JANUVIA 100 MG TABLET] 90 tablet 0    Sig: TAKE 1 TABLET (100 MG TOTAL) BY MOUTH DAILY. PLEASE SCHEDULE OFFICE VISIT BEFORE ANY FUTURE REFILL.     Endocrinology:  Diabetes - DPP-4 Inhibitors Failed - 05/18/2023  8:53 AM      Failed - HBA1C is between 0 and 7.9 and within 180 days    Hgb A1c MFr Bld  Date Value Ref Range Status  09/21/2022 7.1 (H) 4.8 - 5.6 % Final    Comment:             Prediabetes: 5.7 - 6.4          Diabetes: >6.4          Glycemic control for adults with diabetes: <7.0          Passed - Cr in normal range and within 360 days    Creat  Date Value Ref Range Status  10/09/2017 1.01 0.70 - 1.25 mg/dL Final    Comment:    For patients >34 years of age, the reference limit for Creatinine is approximately 13% higher for people identified as African-American. .    Creatinine, Ser  Date Value Ref Range Status  09/21/2022 0.92 0.76 - 1.27 mg/dL Final   Creatinine, POC  Date Value Ref Range Status  08/22/2016 n/a mg/dL Final         Passed - Valid encounter within last 6 months    Recent Outpatient Visits           7 months ago Essential (primary) hypertension   [facility], MD   1 year ago Controlled type 2 diabetes mellitus without complication, without long-term current use of insulin (HCC)   [place] [facility] Malva Limes, MD   1 year ago Controlled type 2 diabetes mellitus without complication, without long-term current use of insulin (HCC)   [place] [facility] Mal[facility] Limes, MD   1 year ago Annual physical exam   [place] [facility] Malva Limes, MD   2 years ago Controlled type 2 diabetes mellitus without complication, without long-term current use of insulin (HCC)   [place] [place] [facility], MD       Future Appointments             Tomorrow Sherrie Mustache Demetrios Isaacs, MD [facility]   In 2 months Deirdre Evener, MD [place] [facility]             glucose blood (ONETOUCH ULTRA) test strip [place] Med Name: ONE TOUCH ULTRA BLUE TEST STRP] 100 strip 1    Sig: USE 1 STRIP TO CHECK BLOOD SUGAR ONCE DAILY E11.9     Endocrinology: Diabetes - Testing Supplies Passed - 05/18/2023  8:53 AM      Passed - Valid encounter within last 12 months    Recent Outpatient Visits           7 months ago Essential (primary) hypertension   [place] [facility] Malva Limes, MD   1 year ago Controlled type 2 diabetes mellitus without complication, without long-term current use of insulin [facility])    [facility] Malva [facility], MD   1 year ago Controlled type 2 diabetes mellitus without complication, without long-term current  use of insulin (HCC)   [place] [facility], MD   1 year ago Annual physical exam   [facility], MD   2 years ago Controlled type 2 diabetes mellitus without complication, without long-term current use of insulin (HCC)   [place] [facility] Sherrie Mustache, Demetrios Isaacs, MD       Future Appointments             Tomorrow Sherrie Mustache Demetrios Isaacs, MD [facility]   In 2 months Deirdre Evener, MD [place] [facility]             gabapentin (NEURONTIN) 300 MG capsule [Pharmacy Med Name: GABAPENTIN 300 MG CAPSULE] 270 capsule 1    Sig: TAKE 1 CAPSULE BY MOUTH THREE TIMES A DAY     Neurology: Anticonvulsants - gabapentin Passed - 05/18/2023  8:53 AM      Passed - Cr in normal range and within 360 days    Creat  Date Value Ref Range Status  10/09/2017 1.01 0.70 - 1.25 mg/dL Final    Comment:    For patients >95 years of age, the reference limit for  Creatinine is approximately 13% higher for people identified as African-American. .    Creatinine, Ser  Date Value Ref Range Status  09/21/2022 0.92 0.76 - 1.27 mg/dL Final   Creatinine, POC  Date Value Ref Range Status  08/22/2016 n/a mg/dL Final         Passed - Completed PHQ-2 or PHQ-9 in the last 360 days      Passed - Valid encounter within last 12 months    Recent Outpatient Visits           7 months ago Essential (primary) hypertension   [facility], MD   1 year ago Controlled type 2 diabetes mellitus without complication, without long-term current use of insulin (HCC)   [place] [facility] Malva Limes, MD   1 year ago Controlled type 2 diabetes mellitus without complication, without long-term current use of insulin (HCC)   [place] [facility] Malva Limes, MD   1 year ago Annual physical exam   [facility] Malva Limes, MD   2 years ago Controlled type 2 diabetes mellitus without complication, without long-term current use of insulin (HCC)    [facility] Fisher, Demetrios Isaacs, MD       Future Appointments             Tomorrow Sherrie Mustache, Demetrios Isaacs, MD [facility]   In 2 months Deirdre Evener, MD [facility]

## 2023-05-19 ENCOUNTER — Ambulatory Visit (INDEPENDENT_AMBULATORY_CARE_PROVIDER_SITE_OTHER): Payer: PPO | Admitting: Family Medicine

## 2023-05-19 ENCOUNTER — Encounter: Payer: Self-pay | Admitting: Family Medicine

## 2023-05-19 VITALS — BP 138/78 | HR 51 | Temp 98.2°F | Ht 64.02 in | Wt 122.6 lb

## 2023-05-19 DIAGNOSIS — E611 Iron deficiency: Secondary | ICD-10-CM

## 2023-05-19 DIAGNOSIS — E119 Type 2 diabetes mellitus without complications: Secondary | ICD-10-CM | POA: Diagnosis not present

## 2023-05-19 DIAGNOSIS — I10 Essential (primary) hypertension: Secondary | ICD-10-CM | POA: Diagnosis not present

## 2023-05-19 DIAGNOSIS — E538 Deficiency of other specified B group vitamins: Secondary | ICD-10-CM | POA: Diagnosis not present

## 2023-05-19 LAB — POCT GLYCOSYLATED HEMOGLOBIN (HGB A1C)
Est. average glucose Bld gHb Est-mCnc: 126
Hemoglobin A1C: 6 % — AB (ref 4.0–5.6)

## 2023-05-19 NOTE — Progress Notes (Signed)
Established patient visit   Patient: Jeremiah Meyer   DOB: 02-12-1951   72 y.o. Male  MRN: [id number] Visit Date: 05/19/2023  Today's healthcare provider: Mila Merry, MD   Chief Complaint  Patient presents with   Medication Refill   Diabetes   Hypertension   Subjective    Discussed the use of AI scribe software for clinical note transcription with the patient, who gave verbal consent to proceed.  History of Present Illness   The patient, with a history of diabetes, presents for a routine checkup and prescription refill. He reports a recent period of intense physical activity during a home project, which resulted in significant weight loss of 15 pounds over two weeks and lower morning blood glucose readings. However, as the physical activity decreased, he noticed his blood glucose levels rising again. He also mentions that his blood glucose levels seem to be lower in the morning if he consumes a snack, typically peanut butter-based, during the night. Despite these fluctuations, he believes his current HbA1c should be well below 7.  The patient also reports an incident of forgetting to take his medication, which resulted in a night of poor sleep, with only about three hours of rest. He believes this incident may have affected his blood pressure reading, which was higher than usual at 150/77. However, a subsequent reading during the visit was lower at 138/78.  The patient is currently on a regimen of Glipizide and Januvia for his diabetes. He expresses a preference for 90-day supplies of his medications to reduce the frequency of pharmacy visits.       Medications: Outpatient Medications Prior to Visit  Medication Sig   aspirin 81 MG tablet Take 81 mg by mouth daily.   betamethasone dipropionate (DIPROLENE) 0.05 % cream Apply topically daily. (Patient taking differently: Apply topically daily. As needed for psoriasis flare ups)   Blood Glucose Monitoring Suppl (ONE TOUCH ULTRA  2) w/Device KIT Use to check blood sugar daily for type 2 diabetes   cyanocobalamin (VITAMIN B12) 1000 MCG/ML injection INJECT 1 MLS MONTHLY AS DIRECTED   ferrous gluconate (FERGON) 324 MG tablet TAKE 1 TABLET BY MOUTH DAILY WITH BREAKFAST   fluticasone (FLONASE) 50 MCG/ACT nasal spray Place 2 sprays into both nostrils daily.   gabapentin (NEURONTIN) 300 MG capsule TAKE 1 CAPSULE BY MOUTH THREE TIMES A DAY   glipiZIDE (GLUCOTROL XL) 2.5 MG 24 hr tablet TAKE 1 TABLET BY MOUTH EVERY DAY WITH BREAKFAST   glucose blood (ONETOUCH ULTRA) test strip USE 1 STRIP TO CHECK BLOOD SUGAR ONCE DAILY E11.9   lisinopril-hydrochlorothiazide (ZESTORETIC) 10-12.5 MG tablet TAKE 1 TABLET BY MOUTH EVERY DAY   meloxicam (MOBIC) 15 MG tablet Take 1 tablet (15 mg total) by mouth daily as needed.   metFORMIN (GLUCOPHAGE) 1000 MG tablet TAKE 1 TABLET BY MOUTH TWICE A DAY   Multiple Vitamin tablet Take 1 tablet by mouth daily.   pramipexole (MIRAPEX) 0.5 MG tablet TAKE 1 TABLET BY MOUTH EVERYDAY AT BEDTIME   rosuvastatin (CRESTOR) 5 MG tablet TAKE 1 TABLET BY MOUTH EVERY DAY   sitaGLIPtin (JANUVIA) 100 MG tablet TAKE 1 TABLET (100 MG TOTAL) BY MOUTH DAILY. PLEASE SCHEDULE OFFICE VISIT BEFORE ANY FUTURE REFILL.   No facility-administered medications prior to visit.   Review of Systems       Objective    BP 138/78 (BP Location: Right Arm)   Pulse (!) 51   Temp 98.2 F (36.8 C) (Oral)  Ht 5' 4.02" (1.626 m)   Wt 122 lb 9.6 oz (55.6 kg)   SpO2 100%   BMI 21.03 kg/m    Physical Exam   General appearance: Well developed, well nourished male, cooperative and in no acute distress Head: Normocephalic, without obvious abnormality, atraumatic Respiratory: Respirations even and unlabored, normal respiratory rate Extremities: All extremities are intact.  Skin: Skin color, texture, turgor normal. No rashes seen  Psych: Appropriate mood and affect. Neurologic: Mental status: Alert, oriented to person, place, and  time, thought content appropriate.   Results for orders placed or performed in visit on 05/19/23  POCT HgB A1C  Result Value Ref Range   Hemoglobin A1C 6.0 (A) 4.0 - 5.6 %   Est. average glucose Bld gHb Est-mCnc 126     Assessment & Plan     Assessment and Plan    Type 2 Diabetes Mellitus: Patient reports variable blood glucose levels, with lower levels when engaging in heavy physical work and weight loss. Current A1C is 6.0, indicating good overall control. Patient is on Januvia and Glipizide. -Continue current regimen of Januvia and Glipizide. -Change prescriptions to 90-day supply to improve medication adherence. -Encourage regular physical activity and balanced diet to maintain blood glucose control.  Hypertension: Blood pressure slightly elevated at 138/78, but patient reports inconsistent medication adherence. -Encourage consistent medication use. -Check blood pressure at next visit.  General Health Maintenance: -Schedule annual physical for end of November 2024.            Mila Merry, MD  [facility] [phone] (phone) [phone] (fax)  [facility]

## 2023-05-26 ENCOUNTER — Other Ambulatory Visit: Payer: Self-pay | Admitting: Family Medicine

## 2023-05-26 DIAGNOSIS — I10 Essential (primary) hypertension: Secondary | ICD-10-CM

## 2023-05-26 MED ORDER — LISINOPRIL-HYDROCHLOROTHIAZIDE 10-12.5 MG PO TABS
1.0000 | ORAL_TABLET | Freq: Every day | ORAL | 0 refills | Status: DC
Start: 2023-05-26 — End: 2023-08-21

## 2023-05-26 NOTE — Addendum Note (Signed)
Addended by: Melene Plan on: 05/26/2023 03:23 PM   Modules accepted: Orders

## 2023-05-26 NOTE — Telephone Encounter (Signed)
Requested Prescriptions  Pending Prescriptions Disp Refills   lisinopril-hydrochlorothiazide (ZESTORETIC) 10-12.5 MG tablet 90 tablet 0    Sig: Take 1 tablet by mouth daily.     Cardiovascular:  ACEI + Diuretic Combos Failed - 05/26/2023  3:23 PM      Failed - Na in normal range and within 180 days    Sodium  Date Value Ref Range Status  09/21/2022 140 134 - 144 mmol/L Final         Failed - K in normal range and within 180 days    Potassium  Date Value Ref Range Status  09/21/2022 4.6 3.5 - 5.2 mmol/L Final         Failed - Cr in normal range and within 180 days    Creat  Date Value Ref Range Status  10/09/2017 1.01 0.70 - 1.25 mg/dL Final    Comment:    For patients >2 years of age, the reference limit for Creatinine is approximately 13% higher for people identified as African-American. .    Creatinine, Ser  Date Value Ref Range Status  09/21/2022 0.92 0.76 - 1.27 mg/dL Final   Creatinine, POC  Date Value Ref Range Status  08/22/2016 n/a mg/dL Final         Failed - eGFR is 30 or above and within 180 days    GFR calc Af Amer  Date Value Ref Range Status  05/20/2020 93 >59 mL/min/1.73 Final    Comment:    **[facility] currently reports eGFR in compliance with the current**   recommendations of the [employer]. [facility] will   update reporting as new guidelines are published from the NKF-ASN   Task force.    GFR calc non Af Amer  Date Value Ref Range Status  05/20/2020 80 >59 mL/min/1.73 Final   eGFR  Date Value Ref Range Status  09/21/2022 89 >59 mL/min/1.73 Final         Passed - Patient is not pregnant      Passed - Last BP in normal range    BP Readings from Last 1 Encounters:  05/19/23 138/78         Passed - Valid encounter within last 6 months    Recent Outpatient Visits           1 week ago Essential (primary) hypertension   [place] [facility] Malva Limes, MD   8 months ago Essential (primary)  hypertension   [place] [facility] Malva Limes, MD   1 year ago Controlled type 2 diabetes mellitus without complication, without long-term current use of insulin (HCC)   [place] [facility] Malva Limes, MD   1 year ago Controlled type 2 diabetes mellitus without complication, without long-term current use of insulin (HCC)   [place] Malva Limes, MD   2 years ago Annual physical exam   [facility] Malva L[facility], MD       Future Appointments             In 1 month Deirdre Evener, MD  [facility]   In 3 months Fisher, Demetrios Isaacs, MD [facility]            Signed Prescriptions Disp Refills   lisinopril-hydrochlorothiazide (ZESTORETIC) 10-12.5 MG tablet 90 tablet 0    Sig: TAKE 1 TABLET BY MOUTH EVERY DAY     Cardiovascular:  ACEI + Diuretic Combos Failed - 05/26/2023  9:52 AM      Failed - Na in normal range and within 180 days    Sodium  Date Value Ref Range Status  09/21/2022 140 134 - 144 mmol/L Final         Failed - K in normal range and within 180 days    Potassium  Date Value Ref Range Status  09/21/2022 4.6 3.5 - 5.2 mmol/L Final         Failed - Cr in normal range and within 180 days    Creat  Date Value Ref Range Status  10/09/2017 1.01 0.70 - 1.25 mg/dL Final    Comment:    For patients >24 years of age, the reference limit for Creatinine is approximately 13% higher for people identified as African-American. .    Creatinine, Ser  Date Value Ref Range Status  09/21/2022 0.92 0.76 - 1.27 mg/dL Final   Creatinine, POC  Date Value Ref Range Status  08/22/2016 n/a mg/dL Final         Failed - eGFR is 30 or above and within 180 days    GFR calc Af Amer  Date Value Ref Range Status  05/20/2020 93 >59 mL/min/1.73 Final    Comment:    **[facility] currently reports eGFR in compliance with the  current**   recommendations of the [employer]. [facility] will   update reporting as new guidelines are published from the NKF-ASN   Task force.    GFR calc non Af Amer  Date Value Ref Range Status  05/20/2020 80 >59 mL/min/1.73 Final   eGFR  Date Value Ref Range Status  09/21/2022 89 >59 mL/min/1.73 Final         Passed - Patient is not pregnant      Passed - Last BP in normal range    BP Readings from Last 1 Encounters:  05/19/23 138/78         Passed - Valid encounter within last 6 months    Recent Outpatient Visits           1 week ago Essential (primary) hypertension   [place] [facility] Mal[facility] Limes, MD   8 months ago Essential (primary) hypertension   [place] [facility] Malva L[facility], MD   1 year ago Controlled type 2 diabetes mellitus without complication, without long-term current use of insulin (HCC)   [place] [facility] Malva Limes, MD   1 year ago Controlled type 2 diabetes mellitus without complication, without long-term current use of insulin (HCC)   [place] [facility] Malva Limes, MD   2 years ago Annual physical exam   [place] [facility] Malva Limes, MD       Future Appointments             In 1 month Deirdre Evener, MD [place] [facility]   In 3 months Fisher, Demetrios Isaacs, MD [facility], [facility]

## 2023-05-26 NOTE — Telephone Encounter (Signed)
Requested Prescriptions  Pending Prescriptions Disp Refills   lisinopril-hydrochlorothiazide (ZESTORETIC) 10-12.5 MG tablet [Pharmacy Med Name: LISINOPRIL-HCTZ 10-12.5 MG TAB] 90 tablet 0    Sig: TAKE 1 TABLET BY MOUTH EVERY DAY     Cardiovascular:  ACEI + Diuretic Combos Failed - 05/26/2023  9:52 AM      Failed - Na in normal range and within 180 days    Sodium  Date Value Ref Range Status  09/21/2022 140 134 - 144 mmol/L Final         Failed - K in normal range and within 180 days    Potassium  Date Value Ref Range Status  09/21/2022 4.6 3.5 - 5.2 mmol/L Final         Failed - Cr in normal range and within 180 days    Creat  Date Value Ref Range Status  10/09/2017 1.01 0.70 - 1.25 mg/dL Final    Comment:    For patients >75 years of age, the reference limit for Creatinine is approximately 13% higher for people identified as African-American. .    Creatinine, Ser  Date Value Ref Range Status  09/21/2022 0.92 0.76 - 1.27 mg/dL Final   Creatinine, POC  Date Value Ref Range Status  08/22/2016 n/a mg/dL Final         Failed - eGFR is 30 or above and within 180 days    GFR calc Af Amer  Date Value Ref Range Status  05/20/2020 93 >59 mL/min/1.73 Final    Comment:    **[facility] currently reports eGFR in compliance with the current**   recommendations of the [employer]. [facility] will   update reporting as new guidelines are published from the NKF-ASN   Task force.    GFR calc non Af Amer  Date Value Ref Range Status  05/20/2020 80 >59 mL/min/1.73 Final   eGFR  Date Value Ref Range Status  09/21/2022 89 >59 mL/min/1.73 Final         Passed - Patient is not pregnant      Passed - Last BP in normal range    BP Readings from Last 1 Encounters:  05/19/23 138/78         Passed - Valid encounter within last 6 months    Recent Outpatient Visits           1 week ago Essential (primary) hypertension   [place] [facility] Malva Limes, MD   8 months ago Essential (primary) hypertension   [place] [facility] Mal[facility] Limes, MD   1 year ago Controlled type 2 diabetes mellitus without complication, without long-term current use of insulin (HCC)   [place] [facility] Malva Limes, MD   1 year ago Controlled type 2 diabetes mellitus without complication, without long-term current use of insulin (HCC)    [place] Malva Limes, MD   2 years ago Annual physical exam   [place] [facility] Malva Limes, MD       Future Appointments             In 1 month Deirdre Evener, MD  [facility]   In 3 months Fisher, Demetrios Isaacs, MD [facility]

## 2023-06-08 ENCOUNTER — Other Ambulatory Visit: Payer: Self-pay | Admitting: Family Medicine

## 2023-06-08 ENCOUNTER — Ambulatory Visit: Payer: PPO | Admitting: Dermatology

## 2023-06-08 DIAGNOSIS — E119 Type 2 diabetes mellitus without complications: Secondary | ICD-10-CM

## 2023-06-08 MED ORDER — GLIPIZIDE ER 2.5 MG PO TB24
2.5000 mg | ORAL_TABLET | Freq: Every day | ORAL | 3 refills | Status: DC
Start: 2023-06-08 — End: 2024-05-17

## 2023-06-19 ENCOUNTER — Ambulatory Visit: Payer: PPO | Admitting: Dermatology

## 2023-06-19 ENCOUNTER — Encounter: Payer: Self-pay | Admitting: Dermatology

## 2023-06-19 DIAGNOSIS — L570 Actinic keratosis: Secondary | ICD-10-CM

## 2023-06-19 DIAGNOSIS — L821 Other seborrheic keratosis: Secondary | ICD-10-CM | POA: Diagnosis not present

## 2023-06-19 DIAGNOSIS — W908XXA Exposure to other nonionizing radiation, initial encounter: Secondary | ICD-10-CM

## 2023-06-19 DIAGNOSIS — Z1283 Encounter for screening for malignant neoplasm of skin: Secondary | ICD-10-CM

## 2023-06-19 DIAGNOSIS — L814 Other melanin hyperpigmentation: Secondary | ICD-10-CM | POA: Diagnosis not present

## 2023-06-19 DIAGNOSIS — D1801 Hemangioma of skin and subcutaneous tissue: Secondary | ICD-10-CM | POA: Diagnosis not present

## 2023-06-19 DIAGNOSIS — D229 Melanocytic nevi, unspecified: Secondary | ICD-10-CM

## 2023-06-19 DIAGNOSIS — L578 Other skin changes due to chronic exposure to nonionizing radiation: Secondary | ICD-10-CM | POA: Diagnosis not present

## 2023-06-19 NOTE — Patient Instructions (Addendum)
Recommend daily broad spectrum sunscreen SPF 30+ to sun-exposed areas, reapply every 2 hours as needed. Call for new or changing lesions.  Staying in the shade or wearing long sleeves, sun glasses (UVA+UVB protection) and wide brim hats (4-inch brim around the entire circumference of the hat) are also recommended for sun protection.      Cryotherapy Aftercare  Wash gently with soap and water everyday.   Apply Vaseline and Band-Aid daily until healed.     Due to recent changes in healthcare laws, you may see results of your pathology and/or laboratory studies on MyChart before the doctors have had a chance to review them. We understand that in some cases there may be results that are confusing or concerning to you. Please understand that not all results are received at the same time and often the doctors may need to interpret multiple results in order to provide you with the best plan of care or course of treatment. Therefore, we ask that you please give [place] 2 business days to thoroughly review all your results before contacting the office for clarification. Should we see a critical lab result, you will be contacted sooner.   If You Need Anything After Your Visit  If you have any questions or concerns for your doctor, please call our main line at [phone] and press option 4 to reach your doctor's medical assistant. If no one answers, please leave a voicemail as directed and we will return your call as soon as possible. Messages left after 4 pm will be answered the following business day.   You may also send [place] a message via MyChart. We typically respond to MyChart messages within 1-2 business days.  For prescription refills, please ask your pharmacy to contact our office. Our fax number is [phone].  If you have an urgent issue when the clinic is closed that cannot wait until the next business day, you can page your doctor at the number below.    Please note that while we do our best  to be available for urgent issues outside of office hours, we are not available 24/7.   If you have an urgent issue and are unable to reach [place], you may choose to seek medical care at your doctor's office, retail clinic, urgent care center, or emergency room.  If you have a medical emergency, please immediately call 911 or go to the emergency department.  Pager Numbers  - Dr. Gwen Pounds: [phone]  - Dr. Roseanne Reno: [phone]  - Dr. Katrinka Blazing: [phone]   In the event of inclement weather, please call our main line at [phone] for an update on the status of any delays or closures.  Dermatology Medication Tips: Please keep the boxes that topical medications come in in order to help keep track of the instructions about where and how to use these. Pharmacies typically print the medication instructions only on the boxes and not directly on the medication tubes.   If your medication is too expensive, please contact our office at [phone] option 4 or send [place] a message through MyChart.   We are unable to tell what your co-pay for medications will be in advance as this is different depending on your insurance coverage. However, we may be able to find a substitute medication at lower cost or fill out paperwork to get insurance to cover a needed medication.   If a prior authorization is required to get your medication covered by your insurance company, please allow [place] 1-2 business days to  complete this process.  Drug prices often vary depending on where the prescription is filled and some pharmacies may offer cheaper prices.  The website www.goodrx.com contains coupons for medications through different pharmacies. The prices here do not account for what the cost may be with help from insurance (it may be cheaper with your insurance), but the website can give you the price if you did not use any insurance.  - You can print the associated coupon and take it with your prescription to the  pharmacy.  - You may also stop by our office during regular business hours and pick up a GoodRx coupon card.  - If you need your prescription sent electronically to a different pharmacy, notify our office through [facility] or by phone at [phone] option 4.     Si Usted Necesita Algo Despus de Su Visita  Tambin puede enviarnos un mensaje a travs de Clinical cytogeneticist. Por lo general respondemos a los mensajes de MyChart en el transcurso de 1 a 2 das hbiles.  Para renovar recetas, por favor pida a su farmacia que se ponga en contacto con nuestra oficina. Annie Sable de fax es [place] [phone].  Si tiene un asunto urgente cuando la clnica est cerrada y que no puede esperar hasta el siguiente da hbil, puede llamar/localizar a su doctor(a) al nmero que aparece a continuacin.   Por favor, tenga en cuenta que aunque hacemos todo lo posible para estar disponibles para asuntos urgentes fuera del horario de [place], no estamos disponibles las 24 horas del da, los 7 [address]  [address] de la [place].   Si tiene un problema urgente y no puede comunicarse con nosotros, puede optar por buscar atencin mdica  en el consultorio de su doctor(a), en una clnica privada, en un centro de atencin urgente o en una sala de emergencias.  Si tiene Engineer, drilling, por favor llame inmediatamente al 911 o vaya a la sala de emergencias.  Nmeros de bper  - Dr. Gwen Pounds: [phone]  - Dra. Roseanne Reno: [phone]  - Dr. Katrinka Blazing: [phone]   En caso de inclemencias del tiempo, por favor llame a Lacy Duverney principal al [phone] para una actualizacin sobre el [place] de cualquier retraso o cierre.  Consejos para la medicacin en dermatologa: Por favor, guarde las cajas en las que vienen los medicamentos de uso tpico para ayudarle a seguir las instrucciones sobre dnde y cmo usarlos. Las farmacias generalmente imprimen las instrucciones del medicamento slo en las cajas y no directamente en los  tubos del [place].   Si su medicamento es muy caro, por favor, pngase en contacto con Rolm Gala llamando al [phone] y presione la opcin 4 o envenos un mensaje a travs de Clinical cytogeneticist.   No podemos decirle cul ser su copago por los medicamentos por adelantado ya que esto es diferente dependiendo de la cobertura de su seguro. Sin embargo, es posible que podamos encontrar un medicamento sustituto a Audiological scientist un formulario para que el seguro cubra el medicamento que se considera necesario.   Si se requiere una autorizacin previa para que su compaa de seguros [place] su medicamento, por favor permtanos de 1 a 2 das hbiles para completar [address]  Los precios de los medicamentos varan con frecuencia dependiendo del Environmental consultant de dnde se surte la receta y alguna farmacias pueden ofrecer precios ms baratos.  El sitio web www.goodrx.com tiene cupones para medicamentos de Health and safety inspector. Los precios aqu no tienen en cuenta lo que podra costar con la [employer]  del seguro (puede ser ms barato con su seguro), pero el sitio web puede darle el precio si no Visual merchandiser.  - Puede imprimir el cupn correspondiente y llevarlo con su receta a la farmacia.  - Tambin puede pasar por nuestra oficina durante el horario de atencin regular y Education officer, museum una tarjeta de cupones de GoodRx.  - Si necesita que su receta se enve electrnicamente a una farmacia diferente, informe a nuestra oficina a travs de MyChart de [place] o por telfono llamando al [phone] y presione la opcin 4.

## 2023-06-19 NOTE — Progress Notes (Signed)
New Patient Visit   Subjective  Jeremiah Meyer is a 72 y.o. male who presents for the following: Skin Cancer Screening and Full Body Skin Exam, patient report his brother has a history of skin cancers.  The patient presents for Total-Body Skin Exam (TBSE) for skin cancer screening and mole check. The patient has spots, moles and lesions to be evaluated, some may be new or changing and the patient may have concern these could be cancer.    The following portions of the chart were reviewed this encounter and updated as appropriate: medications, allergies, medical history  Review of Systems:  No other skin or systemic complaints except as noted in HPI or Assessment and Plan.  Objective  Well appearing patient in no apparent distress; mood and affect are within normal limits.  A full examination was performed including scalp, head, eyes, ears, nose, lips, neck, chest, axillae, abdomen, back, buttocks, bilateral upper extremities, bilateral lower extremities, hands, feet, fingers, toes, fingernails, and toenails. All findings within normal limits unless otherwise noted below.   Relevant physical exam findings are noted in the Assessment and Plan.  left earlobe x 1, left cheek in beard area x 1 (2) Erythematous thin papules/macules with gritty scale.     Assessment & Plan   SKIN CANCER SCREENING PERFORMED TODAY.  ACTINIC DAMAGE - Chronic condition, secondary to cumulative UV/sun exposure - diffuse scaly erythematous macules with underlying dyspigmentation - Recommend daily broad spectrum sunscreen SPF 30+ to sun-exposed areas, reapply every 2 hours as needed.  - Staying in the shade or wearing long sleeves, sun glasses (UVA+UVB protection) and wide brim hats (4-inch brim around the entire circumference of the hat) are also recommended for sun protection.  - Call for new or changing lesions.  LENTIGINES, SEBORRHEIC KERATOSES, HEMANGIOMAS - Benign normal skin lesions -  Benign-appearing - Call for any changes  MELANOCYTIC NEVI - Tan-brown and/or pink-flesh-colored symmetric macules and papules - Benign appearing on exam today - Observation - Call clinic for new or changing moles - Recommend daily use of broad spectrum spf 30+ sunscreen to sun-exposed areas.   AK (actinic keratosis) (2) left earlobe x 1, left cheek in beard area x 1  Actinic keratoses are precancerous spots that appear secondary to cumulative UV radiation exposure/sun exposure over time. They are chronic with expected duration over 1 year. A portion of actinic keratoses will progress to squamous cell carcinoma of the skin. It is not possible to reliably predict which spots will progress to skin cancer and so treatment is recommended to prevent development of skin cancer.  Recommend daily broad spectrum sunscreen SPF 30+ to sun-exposed areas, reapply every 2 hours as needed.  Recommend staying in the shade or wearing long sleeves, sun glasses (UVA+UVB protection) and wide brim hats (4-inch brim around the entire circumference of the hat). Call for new or changing lesions.   Destruction of lesion - left earlobe x 1, left cheek in beard area x 1 (2) Complexity: simple   Destruction method: cryotherapy   Informed consent: discussed and consent obtained   Timeout:  patient name, date of birth, surgical site, and procedure verified Lesion destroyed using liquid nitrogen: Yes   Region frozen until ice ball extended beyond lesion: Yes   Outcome: patient tolerated procedure well with no complications   Post-procedure details: wound care instructions given   Additional details:  Prior to procedure, discussed risks of blister formation, small wound, skin dyspigmentation, or rare scar following cryotherapy. Recommend Vaseline ointment  to treated areas while healing.    Return in about 1 year (around 06/18/2024) for TBSE, AKS.  IAngelique Holm, CMA, am acting as scribe for Elie Goody, MD  .   Documentation: I have reviewed the above documentation for accuracy and completeness, and I agree with the above.  Elie Goody, MD

## 2023-06-22 ENCOUNTER — Other Ambulatory Visit: Payer: Self-pay | Admitting: Family Medicine

## 2023-06-22 DIAGNOSIS — E119 Type 2 diabetes mellitus without complications: Secondary | ICD-10-CM

## 2023-07-20 ENCOUNTER — Ambulatory Visit: Payer: PPO | Admitting: Dermatology

## 2023-08-14 ENCOUNTER — Other Ambulatory Visit: Payer: Self-pay | Admitting: Family Medicine

## 2023-08-14 DIAGNOSIS — E119 Type 2 diabetes mellitus without complications: Secondary | ICD-10-CM

## 2023-08-21 ENCOUNTER — Other Ambulatory Visit: Payer: Self-pay | Admitting: Family Medicine

## 2023-08-21 DIAGNOSIS — I10 Essential (primary) hypertension: Secondary | ICD-10-CM

## 2023-08-26 ENCOUNTER — Other Ambulatory Visit: Payer: Self-pay | Admitting: Family Medicine

## 2023-08-26 DIAGNOSIS — E119 Type 2 diabetes mellitus without complications: Secondary | ICD-10-CM

## 2023-08-28 NOTE — Telephone Encounter (Signed)
Requested Prescriptions  Pending Prescriptions Disp Refills   rosuvastatin (CRESTOR) 5 MG tablet [Pharmacy Med Name: ROSUVASTATIN CALCIUM 5 MG TAB] 90 tablet 0    Sig: TAKE 1 TABLET BY MOUTH EVERY DAY     Cardiovascular:  Antilipid - Statins 2 Failed - 08/26/2023  8:30 AM      Failed - Lipid Panel in normal range within the last 12 months    Cholesterol, Total  Date Value Ref Range Status  09/21/2022 126 100 - 199 mg/dL Final   LDL Cholesterol (Calc)  Date Value Ref Range Status  10/09/2017 84 mg/dL (calc) Final    Comment:    Reference range: <100 . Desirable range <100 mg/dL for primary prevention;   <70 mg/dL for patients with CHD or diabetic patients  with > or = 2 CHD risk factors. Marland Kitchen LDL-C is now calculated using the Martin-Hopkins  calculation, which is a validated novel method providing  better accuracy than the Friedewald equation in the  estimation of LDL-C.  Horald Pollen et al. [place] Ahr. 1610;960(45): 2061-2068  (http://education.QuestDiagnostics.com/faq/FAQ164)    LDL Chol Calc (NIH)  Date Value Ref Range Status  09/21/2022 71 0 - 99 mg/dL Final   HDL  Date Value Ref Range Status  09/21/2022 35 (L) >39 mg/dL Final   Triglycerides  Date Value Ref Range Status  09/21/2022 106 0 - 149 mg/dL Final         Passed - Cr in normal range and within 360 days    Creat  Date Value Ref Range Status  10/09/2017 1.01 0.70 - 1.25 mg/dL Final    Comment:    For patients >76 years of age, the reference limit for Creatinine is approximately 13% higher for people identified as African-American. .    Creatinine, Ser  Date Value Ref Range Status  09/21/2022 0.92 0.76 - 1.27 mg/dL Final   Creatinine, POC  Date Value Ref Range Status  08/22/2016 n/a mg/dL Final         Passed - Patient is not pregnant      Passed - Valid encounter within last 12 months    Recent Outpatient Visits           3 months ago Essential (primary) hypertension   [place] [facility] Malva Limes, MD   11 months ago Essential (primary) hypertension   [place] [facility] Malva Limes, MD   1 year ago Controlled type 2 diabetes mellitus without complication, without long-term current use of insulin (HCC)   [place] [facility] Malva Limes, MD   1 year ago Controlled type 2 diabetes mellitus without complication, without long-term current use of insulin (HCC)   [facility] Malva Limes, MD   2 years ago Annual physical exam   [facility] Malva Limes, MD       Future Appointments             In 2 weeks Fisher, Demetrios Isaacs, MD [facility], [facility]

## 2023-09-07 ENCOUNTER — Other Ambulatory Visit: Payer: Self-pay | Admitting: Family Medicine

## 2023-09-07 DIAGNOSIS — E119 Type 2 diabetes mellitus without complications: Secondary | ICD-10-CM

## 2023-09-07 DIAGNOSIS — G2581 Restless legs syndrome: Secondary | ICD-10-CM

## 2023-09-07 DIAGNOSIS — E611 Iron deficiency: Secondary | ICD-10-CM

## 2023-09-07 DIAGNOSIS — I10 Essential (primary) hypertension: Secondary | ICD-10-CM

## 2023-09-07 NOTE — Telephone Encounter (Signed)
Requested Prescriptions  Pending Prescriptions Disp Refills   ferrous gluconate (FERGON) 324 MG tablet [Pharmacy Med Name: FERROUS GLUCONATE 324 MG TAB] 100 tablet 0    Sig: TAKE 1 TABLET BY MOUTH EVERY DAY WITH BREAKFAST     Endocrinology:  Minerals - Iron Supplementation Passed - 09/07/2023  9:41 AM      Passed - HGB in normal range and within 360 days    Hemoglobin  Date Value Ref Range Status  09/21/2022 14.5 13.0 - 17.7 g/dL Final         Passed - HCT in normal range and within 360 days    Hematocrit  Date Value Ref Range Status  09/21/2022 43.1 37.5 - 51.0 % Final         Passed - RBC in normal range and within 360 days    RBC  Date Value Ref Range Status  09/21/2022 4.84 4.14 - 5.80 x10E6/uL Final         Passed - Fe (serum) in normal range and within 360 days    Iron  Date Value Ref Range Status  09/21/2022 88 38 - 169 ug/dL Final   Iron Saturation  Date Value Ref Range Status  09/21/2022 31 15 - 55 % Final         Passed - Ferritin in normal range and within 360 days    Ferritin  Date Value Ref Range Status  09/21/2022 112 30 - 400 ng/mL Final         Passed - Valid encounter within last 12 months    Recent Outpatient Visits           3 months ago Essential (primary) hypertension   [place] [facility] Malva Limes, MD   11 months ago Essential (primary) hypertension   [place] [facility] Malva Limes, MD   1 year ago Controlled type 2 diabetes mellitus without complication, without long-term current use of insulin (HCC)   [facility] Malva Limes, MD   1 year ago Controlled type 2 diabetes mellitus without complication, without long-term current use of insulin (HCC)   [place] [facility] Malva Limes, MD   2 years ago Annual physical exam   [place] [facility] Malva Limes, MD       Future Appointments             In 1 week  Sherrie Mustache, Demetrios Isaacs, MD [facility], [facility]             pramipexole (MIRAPEX) 0.5 MG tablet [Pharmacy Med Name: PRAMIPEXOLE 0.5 MG TABLET] 90 tablet 0    Sig: TAKE 1 TABLET BY MOUTH EVERYDAY AT BEDTIME     Neurology:  Parkinsonian Agents Passed - 09/07/2023  9:41 AM      Passed - Last BP in normal range    BP Readings from Last 1 Encounters:  05/19/23 138/78         Passed - Last Heart Rate in normal range    Pulse Readings from Last 1 Encounters:  05/19/23 (!) 51         Passed - Valid encounter within last 12 months    Recent Outpatient Visits           3 months ago Essential (primary) hypertension   [place] [facility] Malva Limes, MD   11 months ago Essential (primary) hypertension   [facility]va Limes, MD   1 year ago Controlled type 2 diabetes mellitus without complication, without long-term current use of insulin (HCC)   [place] [facility] Malva Limes, MD   1 year ago Controlled type 2 diabetes mellitus without complication, without long-term current use of insulin (HCC)   [place] [facility] Malva Limes, MD   2 years ago Annual physical exam   [place] [facility] Malva Limes, MD       Future Appointments             In 1 week Fisher, Demetrios Isaacs, MD [facility], [facility]             lisinopril-hydrochlorothiazide (ZESTORETIC) 10-12.5 MG tablet [Pharmacy Med Name: LISINOPRIL-HCTZ 10-12.5 MG TAB] 90 tablet 0    Sig: TAKE 1 TABLET BY MOUTH EVERY DAY     Cardiovascular:  ACEI + Diuretic Combos Failed - 09/07/2023  9:41 AM      Failed - Na in normal range and within 180 days    Sodium  Date Value Ref Range Status  09/21/2022 140 134 - 144 mmol/L Final         Failed - K in normal range and within 180 days    Potassium  Date Value Ref Range Status  09/21/2022 4.6 3.5 - 5.2 mmol/L  Final         Failed - Cr in normal range and within 180 days    Creat  Date Value Ref Range Status  10/09/2017 1.01 0.70 - 1.25 mg/dL Final    Comment:    For patients >20 years of age, the reference limit for Creatinine is approximately 13% higher for people identified as African-American. .    Creatinine, Ser  Date Value Ref Range Status  09/21/2022 0.92 0.76 - 1.27 mg/dL Final   Creatinine, POC  Date Value Ref Range Status  08/22/2016 n/a mg/dL Final         Failed - eGFR is 30 or above and within 180 days    GFR calc Af Amer  Date Value Ref Range Status  05/20/2020 93 >59 mL/min/1.73 Final    Comment:    **[facility] currently reports eGFR in compliance with the current**   recommendations of the [employer]. [facility] will   update reporting as new guidelines are published from the NKF-ASN   Task force.    GFR calc non Af Amer  Date Value Ref Range Status  05/20/2020 80 >59 mL/min/1.73 Final   eGFR  Date Value Ref Range Status  09/21/2022 89 >59 mL/min/1.73 Final         Passed - Patient is not pregnant      Passed - Last BP in normal range    BP Readings from Last 1 Encounters:  05/19/23 138/78         Passed - Valid encounter within last 6 months    Recent Outpatient Visits           3 months ago Essential (primary) hypertension   [place] [facility] Malva Limes, MD   11 months ago Essential (primary) hypertension   [place] [facility] Malva Limes, MD   1 year ago Controlled type 2 diabetes mellitus without complication, without long-term current use of insulin [facility])   [place] [facility] Malva Limes, MD   1 year ago Controlled type 2 diabetes  mellitus without complication, without long-term current use of insulin (HCC)   [place] [facility] Malva Limes, MD   2 years ago Annual physical exam   [place] [facility] Malva Limes, MD       Future Appointments             In 1 week Fisher, Demetrios Isaacs, MD [facility]             rosuvastatin (CRESTOR) 5 MG tablet [Pharmacy Med Name: ROSUVASTATIN CALCIUM 5 MG TAB] 90 tablet 0    Sig: TAKE 1 TABLET BY MOUTH EVERY DAY     Cardiovascular:  Antilipid - Statins 2 Failed - 09/07/2023  9:41 AM      Failed - Lipid Panel in normal range within the last 12 months    Cholesterol, Total  Date Value Ref Range Status  09/21/2022 126 100 - 199 mg/dL Final   LDL Cholesterol (Calc)  Date Value Ref Range Status  10/09/2017 84 mg/dL (calc) Final    Comment:    Reference range: <100 . Desirable range <100 mg/dL for primary prevention;   <70 mg/dL for patients with CHD or diabetic patients  with > or = 2 CHD risk factors. Marland Kitchen LDL-C is now calculated using the Martin-Hopkins  calculation, which is a validated novel method providing  better accuracy than the Friedewald equation in the  estimation of LDL-C.  Horald Pollen et al. [place] Ahr. 4098;119(14): 2061-2068  (http://education.Quest[facility].com/faq/FAQ164)    LDL Chol Calc (NIH)  Date Value Ref Range Status  09/21/2022 71 0 - 99 mg/dL Final   HDL  Date Value Ref Range Status  09/21/2022 35 (L) >39 mg/dL Final   Triglycerides  Date Value Ref Range Status  09/21/2022 106 0 - 149 mg/dL Final         Passed - Cr in normal range and within 360 days    Creat  Date Value Ref Range Status  10/09/2017 1.01 0.70 - 1.25 mg/dL Final    Comment:    For patients >70 years of age, the reference limit for Creatinine is approximately 13% higher for people identified as African-American. .    Creatinine, Ser  Date Value Ref Range Status  09/21/2022 0.92 0.76 - 1.27 mg/dL Final   Creatinine, POC  Date Value Ref Range Status  08/22/2016 n/a mg/dL Final         Passed - Patient is not pregnant      Passed - Valid encounter within last 12 months    Recent Outpatient Visits            3 months ago Essential (primary) hypertension   [place] [facility] Malva Limes, MD   11 months ago Essential (primary) hypertension   [place] [facility] Malva Limes, MD   1 year ago Controlled type 2 diabetes mellitus without complication, without long-term current use of insulin (HCC)   [place] [facility] Malva Limes, MD   1 year ago Controlled type 2 diabetes mellitus without complication, without long-term current use of insulin (HCC)   [place] [facility] Malva Limes, MD   2 years ago Annual physical exam   [place] [facility] Malva Limes, MD       Future Appointments             In 1 week Fisher, Demetrios Isaacs, MD [facility]

## 2023-09-15 ENCOUNTER — Encounter: Payer: Self-pay | Admitting: Family Medicine

## 2023-09-15 ENCOUNTER — Ambulatory Visit (INDEPENDENT_AMBULATORY_CARE_PROVIDER_SITE_OTHER): Payer: PPO | Admitting: Family Medicine

## 2023-09-15 VITALS — BP 146/84 | HR 58 | Temp 98.2°F | Resp 16 | Ht 64.0 in | Wt 125.5 lb

## 2023-09-15 DIAGNOSIS — Z0000 Encounter for general adult medical examination without abnormal findings: Secondary | ICD-10-CM

## 2023-09-15 DIAGNOSIS — Z125 Encounter for screening for malignant neoplasm of prostate: Secondary | ICD-10-CM

## 2023-09-15 DIAGNOSIS — G2581 Restless legs syndrome: Secondary | ICD-10-CM

## 2023-09-15 DIAGNOSIS — I10 Essential (primary) hypertension: Secondary | ICD-10-CM | POA: Diagnosis not present

## 2023-09-15 DIAGNOSIS — L821 Other seborrheic keratosis: Secondary | ICD-10-CM | POA: Diagnosis not present

## 2023-09-15 DIAGNOSIS — E119 Type 2 diabetes mellitus without complications: Secondary | ICD-10-CM

## 2023-09-15 DIAGNOSIS — E538 Deficiency of other specified B group vitamins: Secondary | ICD-10-CM

## 2023-09-15 DIAGNOSIS — Z0001 Encounter for general adult medical examination with abnormal findings: Secondary | ICD-10-CM | POA: Diagnosis not present

## 2023-09-15 DIAGNOSIS — Z7984 Long term (current) use of oral hypoglycemic drugs: Secondary | ICD-10-CM

## 2023-09-15 DIAGNOSIS — E611 Iron deficiency: Secondary | ICD-10-CM

## 2023-09-15 NOTE — Progress Notes (Addendum)
Complete physical exam   Patient: Jeremiah Meyer   DOB: Dec 29, 1950   72 y.o. Male  MRN: [id number] Visit Date: 09/15/2023  Today's healthcare provider: Mila Merry, MD   Chief Complaint  Patient presents with   Annual Exam   Subjective    Discussed the use of AI scribe software for clinical note transcription with the patient, who gave verbal consent to proceed.  History of Present Illness   Jeremiah Meyer presents for a routine physical. He reports that his blood sugar levels have been fluctuating, particularly in the mornings. He notes that his blood sugar tends to be lower if he consumes a snack in the middle of the night, with levels as low as 70. However, if he does not eat anything, his blood sugar can rise to as high as 180. He also mentions that if he goes without eating until the afternoon, he starts to feel shaky, indicating a drop in blood sugar levels.  The patient is currently on iron supplements, which, along with gabapentin, he believes have helped with his restless leg syndrome. He recently received a 90-day refill but was informed that he may not be able to refill the prescription again. He expresses a preference for the prescribed iron supplements over over-the-counter options, which he feels were less effective.  The patient has a history of smoking until around the age of 72 but has since quit. He also reports occasional alcohol consumption, approximately four or five times a year. He has a history of skin issues and has previously seen a dermatologist, who treated several pre-cancerous spots. He has not had a follow-up appointment with the dermatologist in over a year.      HPI   Patient had AWV 02/14/23 Last edited by Marjie Skiff, CMA on 09/15/2023  8:48 AM.       Past Medical History:  Diagnosis Date   Anemia    Cyst, pilonidal, with abscess 06/12/2015   Diabetes mellitus without complication (HCC)    type 2   Hemorrhoid 06/12/2015   Hip strain     Hyperlipidemia    Hypertension    Past Surgical History:  Procedure Laterality Date   COLONOSCOPY WITH PROPOFOL N/A 02/17/2021   Procedure: COLONOSCOPY WITH PROPOFOL;  Surgeon: Pasty Spillers, MD;  Location: [facility] ENDOSCOPY;  Service: Endoscopy;  Laterality: N/A;   LUNG SURGERY Left 1985   S/P RECURRENT PNEUMOTHORAX   VASECTOMY  1981   Social History   Socioeconomic History   Marital status: Married    Spouse name: Jeremiah Meyer   Number of children: 1   Years of education: Not on file   Highest education level: Associate degree: academic program  Occupational History   Occupation: Retired    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)  Tobacco Use   Smoking status: Former    Current packs/day: 0.00    Types: Cigarettes    Quit date: 10/24/1984    Years since quitting: 38.9   Smokeless tobacco: Never   Tobacco comments:    Quit 1986  Vaping Use   Vaping status: Never Used  Substance and Sexual Activity   Alcohol use: Yes    Comment: beer 1-2 monthly   Drug use: Yes    Types: Marijuana   Sexual activity: Not on file  Other Topics Concern   Not on file  Social History Narrative   Has one son who lives in [place]   Social Determinants of Health   Financial Resource Strain:  Low Risk  (09/12/2023)   Overall Financial Resource Strain (CARDIA)    Difficulty of Paying Living Expenses: Not hard at all  Food Insecurity: No Food Insecurity (09/12/2023)   Hunger Vital Sign    Worried About Running Out of Food in the Last Year: Never true    Ran Out of Food in the Last Year: Never true  Transportation Needs: No Transportation Needs (09/12/2023)   PRAPARE - Administrator, Civil Service (Medical): No    Lack of Transportation (Non-Medical): No  Physical Activity: Sufficiently Active (09/12/2023)   Exercise Vital Sign    Days of Exercise per Week: 7 days    Minutes of Exercise per Session: 120 min  Stress: No Stress Concern Present (09/12/2023)   [employer] of Occupational Health - Occupational Stress Questionnaire    Feeling of Stress : Only a little  Social Connections: Socially Isolated (09/12/2023)   Social Connection and Isolation Panel [NHANES]    Frequency of Communication with Friends and Family: Once a week    Frequency of Social Gatherings with Friends and Family: Once a week    Attends Religious Services: Never    Database administrator or Organizations: No    Attends Banker Meetings: Never    Marital Status: Married  Catering manager Violence: Not At Risk (02/14/2023)   Humiliation, Afraid, Rape, and Kick questionnaire    Fear of Current or Ex-Partner: No    Emotionally Abused: No    Physically Abused: No    Sexually Abused: No   Family Status  Relation Name Status   Mother  Alive   Father  Deceased   Sister  Alive   Brother  Deceased   Brother  Alive   Brother  Alive   Brother  Alive   Brother  Alive   Neg Hx  (Not Specified)  No partnership data on file   Family History  Problem Relation Age of Onset   Cancer Father        liver   Cancer Brother        stomach and intestine   Diabetes Brother    Crohn's disease Brother    Diabetes Brother    Prostate cancer Neg Hx    Breast cancer Neg Hx    Allergies  Allergen Reactions   Simvastatin     Back and leg pain    Patient Care Team: Malva Limes, MD as PCP - General (Family Medicine) Isla Pence, OD as Consulting Physician (Optometry) Elie Goody, MD as Referring Physician (Dermatology)   Medications: Outpatient Medications Prior to Visit  Medication Sig   aspirin 81 MG tablet Take 81 mg by mouth daily.   betamethasone dipropionate (DIPROLENE) 0.05 % cream Apply topically daily. (Patient taking differently: Apply topically daily. As needed for psoriasis flare ups)   Blood Glucose Monitoring Suppl (ONE TOUCH ULTRA 2) w/Device KIT Use to check blood sugar daily for type 2 diabetes   cyanocobalamin (VITAMIN B12) 1000  MCG/ML injection INJECT 1 MLS MONTHLY AS DIRECTED   ferrous gluconate (FERGON) 324 MG tablet TAKE 1 TABLET BY MOUTH EVERY DAY WITH BREAKFAST   fluticasone (FLONASE) 50 MCG/ACT nasal spray Place 2 sprays into both nostrils daily.   gabapentin (NEURONTIN) 300 MG capsule TAKE 1 CAPSULE BY MOUTH THREE TIMES A DAY   glipiZIDE (GLUCOTROL XL) 2.5 MG 24 hr tablet Take 1 tablet (2.5 mg total) by mouth daily with breakfast.   glucose blood (ONETOUCH ULTRA)  test strip USE 1 STRIP TO CHECK BLOOD SUGAR ONCE DAILY E11.9   JANUVIA 100 MG tablet TAKE 1 TABLET (100 MG TOTAL) BY MOUTH DAILY. PLEASE SCHEDULE OFFICE VISIT BEFORE ANY FUTURE REFILL.   lisinopril-hydrochlorothiazide (ZESTORETIC) 10-12.5 MG tablet TAKE 1 TABLET BY MOUTH EVERY DAY   meloxicam (MOBIC) 15 MG tablet Take 1 tablet (15 mg total) by mouth daily as needed.   metFORMIN (GLUCOPHAGE) 1000 MG tablet TAKE 1 TABLET BY MOUTH TWICE A DAY   Multiple Vitamin tablet Take 1 tablet by mouth daily.   pramipexole (MIRAPEX) 0.5 MG tablet TAKE 1 TABLET BY MOUTH EVERYDAY AT BEDTIME   rosuvastatin (CRESTOR) 5 MG tablet TAKE 1 TABLET BY MOUTH EVERY DAY   No facility-administered medications prior to visit.    Review of Systems  Constitutional:  Negative for appetite change, chills and fever.  Respiratory:  Negative for chest tightness, shortness of breath and wheezing.   Cardiovascular:  Negative for chest pain and palpitations.  Gastrointestinal:  Negative for abdominal pain, nausea and vomiting.      Objective    BP (!) 146/84 (BP Location: Left Arm, Patient Position: Sitting, Cuff Size: Normal)   Pulse (!) 58   Temp 98.2 F (36.8 C) (Oral)   Resp 16   Ht 5\' 4"  (1.626 m)   Wt 125 lb 8 oz (56.9 kg)   BMI 21.54 kg/m    Physical Exam  General Appearance:    Well developed, well nourished male. Alert, cooperative, in no acute distress, appears stated age  Head:    Normocephalic, without obvious abnormality, atraumatic  Eyes:    PERRL,  conjunctiva/corneas clear, EOM's intact, fundi    benign, both eyes       Ears:    Normal TM's and external ear canals, both ears  Nose:   Nares normal, septum midline, mucosa normal, no drainage   or sinus tenderness  Throat:   Lips, mucosa, and tongue normal; teeth and gums normal  Neck:   Supple, symmetrical, trachea midline, no adenopathy;       thyroid:  No enlargement/tenderness/nodules; no carotid   bruit or JVD  Back:     Symmetric, no curvature, ROM normal, no CVA tenderness  Lungs:     Clear to auscultation bilaterally, respirations unlabored  Chest wall:    No tenderness or deformity  Heart:    Bradycardic. Normal rhythm. No murmurs, rubs, or gallops.  S1 and S2 normal  Abdomen:     Soft, no organomegaly. Tender along underside of right 10th rib. No masses  Genitalia:    deferred  Rectal:    deferred  Extremities:   All extremities are intact. No cyanosis or edema  Pulses:   2+ and symmetric all extremities  Skin:   Innumerable benign appearing Sks across back   Lymph nodes:   Cervical, supraclavicular, and axillary nodes normal  Neurologic:   CNII-XII intact. Normal strength, sensation and reflexes      throughout       Last depression screening scores    09/15/2023    8:51 AM 05/19/2023    8:33 AM 02/14/2023   10:41 AM  PHQ 2/9 Scores  PHQ - 2 Score 0 0 0  PHQ- 9 Score  0    Last fall risk screening    09/15/2023    8:51 AM  Fall Risk   Falls in the past year? 0  Number falls in past yr: 0  Injury with Fall? 0  Risk for  fall due to : No Fall Risks   Last Audit-C alcohol use screening    09/12/2023   11:14 AM  Alcohol Use Disorder Test (AUDIT)  1. How often do you have a drink containing alcohol? 1  2. How many drinks containing alcohol do you have on a typical day when you are drinking? 0  3. How often do you have six or more drinks on one occasion? 0  AUDIT-C Score 1   A score of 3 or more in women, and 4 or more in men indicates increased risk for  alcohol abuse, EXCEPT if all of the points are from question 1   No results found for any visits on 09/15/23.  Assessment & Plan    Routine Health Maintenance and Physical Exam  Exercise Activities and Dietary recommendations  Goals      DIET - EAT MORE FRUITS AND VEGETABLES     DIET - INCREASE WATER INTAKE     Recommend increasing water intake to 6-8 glasses a day.       Exercise 150 minutes per week (moderate activity)        Immunization History  Administered Date(s) Administered   Fluad Quad(high Dose 65+) 09/17/2022   Influenza, High Dose Seasonal PF 06/19/2017, 08/14/2018, 06/28/2019, 07/27/2020, 07/27/2021, 08/11/2023   Influenza,inj,Quad PF,6+ Mos 08/25/2014, 09/04/2015   PFIZER Comirnaty(Gray Top)Covid-19 Tri-Sucrose Vaccine 02/23/2021   PFIZER(Purple Top)SARS-COV-2 Vaccination 12/13/2019, 01/03/2020, 07/27/2020   Pfizer Covid-19 Vaccine Bivalent Booster 49yrs & up 07/27/2021   Pfizer(Comirnaty)Fall Seasonal Vaccine 12 years and older 09/17/2022, 08/11/2023   Pneumococcal Conjugate-13 01/27/2016   Pneumococcal Polysaccharide-23 09/09/2013, 05/03/2019   Respiratory Syncytial Virus Vaccine,Recomb Aduvanted(Arexvy) 09/17/2022   Td 10/24/1997   Tdap 12/06/2018   Zoster Recombinant(Shingrix) 05/03/2019, 01/24/2020    Health Maintenance  Topic Date Due   Diabetic kidney evaluation - Urine ACR  02/15/2023   OPHTHALMOLOGY EXAM  03/16/2023   Diabetic kidney evaluation - eGFR measurement  09/22/2023   COVID-19 Vaccine (8 - 2023-24 season) 10/06/2023   HEMOGLOBIN A1C  11/19/2023   Medicare Annual Wellness (AWV)  02/14/2024   DTaP/Tdap/Td (3 - Td or Tdap) 12/06/2028   Colonoscopy  02/18/2031   Pneumonia Vaccine 73+ Years old  Completed   INFLUENZA VACCINE  Completed   Hepatitis C Screening  Completed   Zoster Vaccines- Shingrix  Completed   HPV VACCINES  Aged Out    Discussed health benefits of physical activity, and encouraged him to engage in regular exercise  appropriate for his age and condition.     General Health Maintenance -Annual eye exam due soon.  -Order comprehensive metabolic panel, lipid panel, and urine ACR -Encourage smoking cessation.   Diabetes Mellitus Morning hyperglycemia possibly due to the Somogyi effect. Hypoglycemia when skipping meals. -Continue current management. -Encourage consistent meal times to avoid hypoglycemia. -Check HbA1c today.  Iron Deficiency Anemia Currently on iron supplementation. Reports improvement in restless leg syndrome symptoms since starting iron. -Check iron levels today. -Continue iron supplementation as prescribed.  Restless Leg Syndrome Improvement noted with iron supplementation and gabapentin. -Continue current management with iron and gabapentin.  Right upper quadrant tenderness New onset, superficial, likely musculoskeletal. No associated GI symptoms. -Observe for now.     B12 deficiency  Check B12 levels       Mila Merry, MD  [facility] [phone] (phone) [phone] (fax)  [facility]

## 2023-09-17 LAB — MICROALBUMIN / CREATININE URINE RATIO
Creatinine, Urine: 101.7 mg/dL
Microalb/Creat Ratio: <3 mg/g{creat} (ref 0–29)
Microalbumin, Urine: <3 ug/mL

## 2023-09-17 LAB — CBC WITH DIFFERENTIAL/PLATELET
Basophils Absolute: 0.1 10*3/uL (ref 0.0–0.2)
Basos: 1 %
EOS (ABSOLUTE): 0.1 10*3/uL (ref 0.0–0.4)
Eos: 1 %
Hematocrit: 41.4 % (ref 37.5–51.0)
Hemoglobin: 14 g/dL (ref 13.0–17.7)
Immature Grans (Abs): 0 10*3/uL (ref 0.0–0.1)
Immature Granulocytes: 0 %
Lymphocytes Absolute: 1.1 10*3/uL (ref 0.7–3.1)
Lymphs: 18 %
MCH: 30.6 pg (ref 26.6–33.0)
MCHC: 33.8 g/dL (ref 31.5–35.7)
MCV: 91 fL (ref 79–97)
Monocytes Absolute: 0.5 10*3/uL (ref 0.1–0.9)
Monocytes: 8 %
Neutrophils Absolute: 4.3 10*3/uL (ref 1.4–7.0)
Neutrophils: 72 %
Platelets: 242 10*3/uL (ref 150–450)
RBC: 4.57 x10E6/uL (ref 4.14–5.80)
RDW: 12.5 % (ref 11.6–15.4)
WBC: 5.9 10*3/uL (ref 3.4–10.8)

## 2023-09-17 LAB — COMPREHENSIVE METABOLIC PANEL
ALT: 17 IU/L (ref 0–44)
AST: 19 [IU]/L (ref 0–40)
Albumin: 4.2 g/dL (ref 3.8–4.8)
Alkaline Phosphatase: 83 IU/L (ref 44–121)
BUN/Creatinine Ratio: 16 (ref 10–24)
BUN: 16 mg/dL (ref 8–27)
Bilirubin Total: 0.5 mg/dL (ref 0.0–1.2)
CO2: 23 mmol/L (ref 20–29)
Calcium: 8.9 mg/dL (ref 8.6–10.2)
Chloride: 102 mmol/L (ref 96–106)
Creatinine, Ser: 0.99 mg/dL (ref 0.76–1.27)
Globulin, Total: 2 g/dL (ref 1.5–4.5)
Glucose: 143 mg/dL — ABNORMAL HIGH (ref 70–99)
Potassium: 4.7 mmol/L (ref 3.5–5.2)
Sodium: 141 mmol/L (ref 134–144)
Total Protein: 6.2 g/dL (ref 6.0–8.5)
eGFR: 81 mL/min/{1.73_m2} (ref >59)

## 2023-09-17 LAB — LIPID PANEL
Chol/HDL Ratio: 2.4 ratio (ref 0.0–5.0)
Cholesterol, Total: 122 mg/dL (ref 100–199)
HDL: 51 mg/dL (ref >39)
LDL Chol Calc (NIH): 60 mg/dL (ref 0–99)
Triglycerides: 45 mg/dL (ref 0–149)
VLDL Cholesterol Cal: 11 mg/dL (ref 5–40)

## 2023-09-17 LAB — IRON,TIBC AND FERRITIN PANEL
Ferritin: 69 ng/mL (ref 30–400)
Iron Saturation: 27 % (ref 15–55)
Iron: 75 ug/dL (ref 38–169)
Total Iron Binding Capacity: 281 ug/dL (ref 250–450)
UIBC: 206 ug/dL (ref 111–343)

## 2023-09-17 LAB — PSA TOTAL (REFLEX TO FREE): Prostate Specific Ag, Serum: 1.6 ng/mL (ref 0.0–4.0)

## 2023-09-17 LAB — HEMOGLOBIN A1C
Est. average glucose Bld gHb Est-mCnc: 134 mg/dL
Hgb A1c MFr Bld: 6.3 % — ABNORMAL HIGH (ref 4.8–5.6)

## 2023-09-17 LAB — VITAMIN B12: Vitamin B-12: 391 pg/mL (ref 232–1245)

## 2023-11-16 ENCOUNTER — Other Ambulatory Visit: Payer: Self-pay | Admitting: Family Medicine

## 2023-11-16 DIAGNOSIS — I10 Essential (primary) hypertension: Secondary | ICD-10-CM

## 2023-11-16 DIAGNOSIS — E119 Type 2 diabetes mellitus without complications: Secondary | ICD-10-CM

## 2023-11-16 DIAGNOSIS — G2581 Restless legs syndrome: Secondary | ICD-10-CM

## 2023-11-16 NOTE — Telephone Encounter (Signed)
Requested Prescriptions  Pending Prescriptions Disp Refills   rosuvastatin (CRESTOR) 5 MG tablet [Pharmacy Med Name: ROSUVASTATIN CALCIUM 5 MG TAB] 90 tablet 0    Sig: TAKE 1 TABLET BY MOUTH EVERY DAY     Cardiovascular:  Antilipid - Statins 2 Failed - 11/16/2023  2:00 PM      Failed - Lipid Panel in normal range within the last 12 months    Cholesterol, Total  Date Value Ref Range Status  09/15/2023 122 100 - 199 mg/dL Final   LDL Cholesterol (Calc)  Date Value Ref Range Status  10/09/2017 84 mg/dL (calc) Final    Comment:    Reference range: <100 . Desirable range <100 mg/dL for primary prevention;   <70 mg/dL for patients with CHD or diabetic patients  with > or = 2 CHD risk factors. Marland Kitchen LDL-C is now calculated using the Martin-Hopkins  calculation, which is a validated novel method providing  better accuracy than the Friedewald equation in the  estimation of LDL-C.  Horald Pollen et al. Lenox Ahr. 6045;409(81): 2061-2068  (http://education.[facility].com/faq/FAQ164)    LDL Chol Calc (NIH)  Date Value Ref Range Status  09/15/2023 60 0 - 99 mg/dL Final   HDL  Date Value Ref Range Status  09/15/2023 51 >39 mg/dL Final   Triglycerides  Date Value Ref Range Status  09/15/2023 45 0 - 149 mg/dL Final         Passed - Cr in normal range and within 360 days    Creat  Date Value Ref Range Status  10/09/2017 1.01 0.70 - 1.25 mg/dL Final    Comment:    For patients >81 years of age, the reference limit for Creatinine is approximately 13% higher for people identified as African-American. .    Creatinine, Ser  Date Value Ref Range Status  09/15/2023 0.99 0.76 - 1.27 mg/dL Final   Creatinine, POC  Date Value Ref Range Status  08/22/2016 n/a mg/dL Final         Passed - Patient is not pregnant      Passed - Valid encounter within last 12 months    Recent Outpatient Visits           2 months ago Annual physical exam   [place] [facility] Malva Limes, MD   6 months ago Essential (primary) hypertension   [place] [facility] Malva Limes, MD   1 year ago Essential (primary) hypertension   [place] [facility] Malva Limes, MD   1 year ago Controlled type 2 diabetes mellitus without complication, without long-term current use of insulin (HCC)   [place] [facility] Malva Limes, MD   2 years ago Controlled type 2 diabetes mellitus without complication, without long-term current use of insulin (HCC)   [place] [facility] Malva Limes, MD               lisinopril-hydrochlorothiazide (ZESTORETIC) 10-12.5 MG tablet [Pharmacy Med Name: LISINOPRIL-HCTZ 10-12.5 MG TAB] 90 tablet 0    Sig: TAKE 1 TABLET BY MOUTH EVERY DAY     Cardiovascular:  ACEI + Diuretic Combos Failed - 11/16/2023  2:00 PM      Failed - Last BP in normal range    BP Readings from Last 1 Encounters:  09/15/23 (!) 146/84         Passed - Na in normal range and within 180 days    Sodium  Date Value Ref  Range Status  09/15/2023 141 134 - 144 mmol/L Final         Passed - K in normal range and within 180 days    Potassium  Date Value Ref Range Status  09/15/2023 4.7 3.5 - 5.2 mmol/L Final         Passed - Cr in normal range and within 180 days    Creat  Date Value Ref Range Status  10/09/2017 1.01 0.70 - 1.25 mg/dL Final    Comment:    For patients >57 years of age, the reference limit for Creatinine is approximately 13% higher for people identified as African-American. .    Creatinine, Ser  Date Value Ref Range Status  09/15/2023 0.99 0.76 - 1.27 mg/dL Final   Creatinine, POC  Date Value Ref Range Status  08/22/2016 n/a mg/dL Final         Passed - eGFR is 30 or above and within 180 days    GFR calc Af Amer  Date Value Ref Range Status  05/20/2020 93 >59 mL/min/1.73 Final    Comment:    **[facility] currently reports eGFR in compliance with the  current**   recommendations of the [employer]. [facility] will   update reporting as new guidelines are published from the NKF-ASN   Task force.    GFR calc non Af Amer  Date Value Ref Range Status  05/20/2020 80 >59 mL/min/1.73 Final   eGFR  Date Value Ref Range Status  09/15/2023 81 >59 mL/min/1.73 Final         Passed - Patient is not pregnant      Passed - Valid encounter within last 6 months    Recent Outpatient Visits           2 months ago Annual physical exam   [place] [facility] Malva Limes, MD   6 months ago Essential (primary) hypertension   [place] [facility] Malva Limes, MD   1 year ago Essential (primary) hypertension   [place] [facility] Malva Limes, MD   1 year ago Controlled type 2 diabetes mellitus without complication, without long-term current use of insulin (HCC)   [place] [facility] Malva Limes, MD   2 years ago Controlled type 2 diabetes mellitus without complication, without long-term current use of insulin (HCC)   [place] [facility] Malva Limes, MD               pramipexole (MIRAPEX) 0.5 MG tablet [Pharmacy Med Name: PRAMIPEXOLE 0.5 MG TABLET] 90 tablet 0    Sig: TAKE 1 TABLET BY MOUTH EVERYDAY AT BEDTIME     Neurology:  Parkinsonian Agents Failed - 11/16/2023  2:00 PM      Failed - Last BP in normal range    BP Readings from Last 1 Encounters:  09/15/23 (!) 146/84         Passed - Last Heart Rate in normal range    Pulse Readings from Last 1 Encounters:  09/15/23 (!) 58         Passed - Valid encounter within last 12 months    Recent Outpatient Visits           2 months ago Annual physical exam   [facility] Malva Limes, MD   6 months ago Essential (primary) hypertension   [place] [facility] Malva Limes, MD   1 year ago  Essential (primary)  hypertension   [place] [facility] Malva Limes, MD   1 year ago Controlled type 2 diabetes mellitus without complication, without long-term current use of insulin [facility])   [place] [facility] Malva Limes, MD   2 years ago Controlled type 2 diabetes mellitus without complication, without long-term current use of insulin [facility])   [place] [facility] Sherrie Mustache, Demetrios Isaacs, MD

## 2023-11-20 ENCOUNTER — Other Ambulatory Visit: Payer: Self-pay | Admitting: Family Medicine

## 2023-11-20 DIAGNOSIS — E119 Type 2 diabetes mellitus without complications: Secondary | ICD-10-CM

## 2023-12-10 ENCOUNTER — Other Ambulatory Visit: Payer: Self-pay | Admitting: Family Medicine

## 2023-12-10 DIAGNOSIS — E611 Iron deficiency: Secondary | ICD-10-CM

## 2023-12-10 DIAGNOSIS — E119 Type 2 diabetes mellitus without complications: Secondary | ICD-10-CM

## 2023-12-10 DIAGNOSIS — I10 Essential (primary) hypertension: Secondary | ICD-10-CM

## 2023-12-10 DIAGNOSIS — G2581 Restless legs syndrome: Secondary | ICD-10-CM

## 2024-01-22 DIAGNOSIS — H5203 Hypermetropia, bilateral: Secondary | ICD-10-CM | POA: Diagnosis not present

## 2024-01-22 DIAGNOSIS — H2513 Age-related nuclear cataract, bilateral: Secondary | ICD-10-CM | POA: Diagnosis not present

## 2024-01-22 DIAGNOSIS — H25013 Cortical age-related cataract, bilateral: Secondary | ICD-10-CM | POA: Diagnosis not present

## 2024-01-22 DIAGNOSIS — E119 Type 2 diabetes mellitus without complications: Secondary | ICD-10-CM | POA: Diagnosis not present

## 2024-01-22 DIAGNOSIS — H35033 Hypertensive retinopathy, bilateral: Secondary | ICD-10-CM | POA: Diagnosis not present

## 2024-01-22 LAB — HM DIABETES EYE EXAM

## 2024-02-12 ENCOUNTER — Other Ambulatory Visit: Payer: Self-pay | Admitting: Family Medicine

## 2024-02-12 DIAGNOSIS — E119 Type 2 diabetes mellitus without complications: Secondary | ICD-10-CM

## 2024-02-19 ENCOUNTER — Ambulatory Visit (INDEPENDENT_AMBULATORY_CARE_PROVIDER_SITE_OTHER): Payer: PPO

## 2024-02-19 VITALS — Ht 64.0 in | Wt 124.0 lb

## 2024-02-19 DIAGNOSIS — Z0000 Encounter for general adult medical examination without abnormal findings: Secondary | ICD-10-CM | POA: Diagnosis not present

## 2024-02-19 NOTE — Patient Instructions (Signed)
 Jeremiah Meyer , Thank you for taking time to come for your Medicare Wellness Visit. I appreciate your ongoing commitment to your health goals. Please review the following plan we discussed and let me know if I can assist you in the future.   Referrals/Orders/Follow-Ups/Clinician Recommendations:    Next Medicare AWV: Feb 25, 2025 at 8:50 am video visit      Aim for 30 minutes of exercise or brisk walking, 6-8 glasses of water, and 5 servings of fruits and vegetables each day.   This is a list of the screening recommended for you and due dates:  Health Maintenance  Topic Date Due   Eye exam for diabetics  03/16/2023   COVID-19 Vaccine (8 - Pfizer risk 2024-25 season) 02/09/2024   Hemoglobin A1C  03/14/2024   Flu Shot  05/24/2024   Yearly kidney function blood test for diabetes  09/14/2024   Yearly kidney health urinalysis for diabetes  09/14/2024   Medicare Annual Wellness Visit  02/18/2025   DTaP/Tdap/Td vaccine (3 - Td or Tdap) 12/06/2028   Colon Cancer Screening  02/18/2031   Pneumonia Vaccine  Completed   Hepatitis C Screening  Completed   Zoster (Shingles) Vaccine  Completed   HPV Vaccine  Aged Out   Meningitis B Vaccine  Aged Out    Advanced directives: (Declined) Advance directive discussed with you today. Even though you declined this today, please call our office should you change your mind, and we can give you the proper paperwork for you to fill out. Advance Care Planning is important because it:  [x]  Makes sure you receive the medical care that is consistent with your values, goals, and preferences  [x]  It provides guidance to your family and loved ones and it also reduces their decisional burden about whether or not they are making the right decisions based on what you want done  Follow the link provided in your after visit summary or read over the paperwork we have mailed to you to help you started getting your Advance Directives in place. If you need assistance in  completing these, please reach out to us  so that we can help you!  Next Medicare Annual Wellness Visit scheduled for next year: yes  Understanding Your Risk for Falls Millions of people have serious injuries from falls each year. It is important to understand your risk of falling. Talk with your health care provider about your risk and what you can do to lower it. If you do have a serious fall, make sure to tell your provider. Falling once raises your risk of falling again. How can falls affect me? Serious injuries from falls are common. These include: Broken bones, such as hip fractures. Head injuries, such as traumatic brain injuries (TBI) or concussions. A fear of falling can cause you to avoid activities and stay at home. This can make your muscles weaker and raise your risk for a fall. What can increase my risk? There are a number of risk factors that increase your risk for falling. The more risk factors you have, the higher your risk of falling. Serious injuries from a fall happen most often to people who are older than 73 years old. Teenagers and young adults ages 52-29 are also at higher risk. Common risk factors include: Weakness in the lower body. Being generally weak or confused due to long-term (chronic) illness. Dizziness or balance problems. Poor vision. Medicines that cause dizziness or drowsiness. These may include: Medicines for your blood pressure, heart, anxiety, insomnia,  or swelling (edema). Pain medicines. Muscle relaxants. Other risk factors include: Drinking alcohol. Having had a fall in the past. Having foot pain or wearing improper footwear. Working at a dangerous job. Having any of the following in your home: Tripping hazards, such as floor clutter or loose rugs. Poor lighting. Pets. Having dementia or memory loss. What actions can I take to lower my risk of falling?  Physical activity Stay physically fit. Do strength and balance exercises. Consider  taking a regular class to build strength and balance. Yoga and tai chi are good options. Vision Have your eyes checked every year and your prescription for glasses or contacts updated as needed. Shoes and walking aids Wear non-skid shoes. Wear shoes that have rubber soles and low heels. Do not wear high heels. Do not walk around the house in socks or slippers. Use a cane or walker as told by your provider. Home safety Attach secure railings on both sides of your stairs. Install grab bars for your bathtub, shower, and toilet. Use a non-skid mat in your bathtub or shower. Attach bath mats securely with double-sided, non-slip rug tape. Use good lighting in all rooms. Keep a flashlight near your bed. Make sure there is a clear path from your bed to the bathroom. Use night-lights. Do not use throw rugs. Make sure all carpeting is taped or tacked down securely. Remove all clutter from walkways and stairways, including extension cords. Repair uneven or broken steps and floors. Avoid walking on icy or slippery surfaces. Walk on the grass instead of on icy or slick sidewalks. Use ice melter to get rid of ice on walkways in the winter. Use a cordless phone. Questions to ask your health care provider Can you help me check my risk for a fall? Do any of my medicines make me more likely to fall? Should I take a vitamin D supplement? What exercises can I do to improve my strength and balance? Should I make an appointment to have my vision checked? Do I need a bone density test to check for weak bones (osteoporosis)? Would it help to use a cane or a walker? Where to find more information Centers for Disease Control and Prevention, STEADI: TonerPromos.no Community-Based Fall Prevention Programs: TonerPromos.no [employer] on Aging: BaseRingTones.pl Contact a health care provider if: You fall at home. You are afraid of falling at home. You feel weak, drowsy, or dizzy. This information is not intended to replace  advice given to you by your health care provider. Make sure you discuss any questions you have with your health care provider. Document Revised: 06/13/2022 Document Reviewed: 06/13/2022 Elsevier Patient Education  2024 [employer].

## 2024-02-19 NOTE — Progress Notes (Signed)
 Subjective:   Jeremiah Meyer is a 73 y.o. who presents for a Medicare Wellness preventive visit.  Visit Complete: Virtual I connected with  Jeremiah Meyer on 02/19/24 by a audio enabled telemedicine application and verified that I am speaking with the correct person using two identifiers.  Patient Location: Home  Provider Location: Home Office  I discussed the limitations of evaluation and management by telemedicine. The patient expressed understanding and agreed to proceed.  Vital Signs: Because this visit was a virtual/telehealth visit, some criteria may be missing or patient reported. Any vitals not documented were not able to be obtained and vitals that have been documented are patient reported.  VideoDeclined- This patient declined Librarian, academic. Therefore the visit was completed with audio only.  Persons Participating in Visit: Patient.  AWV Questionnaire: Yes: Patient Medicare AWV questionnaire was completed by the patient on 02/18/2024; I have confirmed that all information answered by patient is correct and no changes since this date.  Cardiac Risk Factors include: advanced age (>57men, >71 women)     Objective:    Today's Vitals   02/19/24 1125  Weight: 124 lb (56.2 kg)  Height: 5\' 4"  (1.626 m)   Body mass index is 21.28 kg/m.     02/19/2024   11:26 AM 02/14/2023   10:44 AM 05/30/2022    3:36 PM 02/17/2021    8:29 AM 05/11/2020    8:39 AM 05/02/2019    9:02 AM 04/18/2018   11:37 AM  Advanced Directives  Does Patient Have a Medical Advance Directive? No No No No No Yes No  Type of Careers adviser;Living will   Copy of Healthcare Power of Attorney in Chart?      No - copy requested   Would patient like information on creating a medical advance directive? No - Patient declined  No - Patient declined No - Patient declined No - Patient declined  No - Patient declined    Current Medications  (verified) Outpatient Encounter Medications as of 02/19/2024  Medication Sig   aspirin 81 MG tablet Take 81 mg by mouth daily.   betamethasone  dipropionate (DIPROLENE ) 0.05 % cream Apply topically daily. (Patient taking differently: Apply topically daily. As needed for psoriasis flare ups)   Blood Glucose Monitoring Suppl (ONE TOUCH ULTRA 2) w/Device KIT Use to check blood sugar daily for type 2 diabetes   cyanocobalamin  (VITAMIN B12) 1000 MCG/ML injection INJECT 1 MLS MONTHLY AS DIRECTED   ferrous gluconate  (FERGON) 324 MG tablet TAKE 1 TABLET BY MOUTH EVERY DAY WITH BREAKFAST   fluticasone  (FLONASE ) 50 MCG/ACT nasal spray Place 2 sprays into both nostrils daily.   gabapentin  (NEURONTIN ) 300 MG capsule TAKE 1 CAPSULE BY MOUTH THREE TIMES A DAY   glipiZIDE  (GLUCOTROL  XL) 2.5 MG 24 hr tablet Take 1 tablet (2.5 mg total) by mouth daily with breakfast.   lisinopril -hydrochlorothiazide  (ZESTORETIC ) 10-12.5 MG tablet TAKE 1 TABLET BY MOUTH EVERY DAY   meloxicam  (MOBIC ) 15 MG tablet Take 1 tablet (15 mg total) by mouth daily as needed.   metFORMIN (GLUCOPHAGE) 1000 MG tablet TAKE 1 TABLET BY MOUTH TWICE A DAY   Multiple Vitamin tablet Take 1 tablet by mouth daily.   ONETOUCH ULTRA test strip USE 1 STRIP TO CHECK BLOOD SUGAR ONCE DAILY E11.9   pramipexole  (MIRAPEX ) 0.5 MG tablet TAKE 1 TABLET BY MOUTH EVERYDAY AT BEDTIME   rosuvastatin  (CRESTOR ) 5 MG tablet TAKE 1 TABLET  BY MOUTH EVERY DAY   sitaGLIPtin  (JANUVIA ) 100 MG tablet TAKE 1 TABLET (100 MG TOTAL) BY MOUTH DAILY. PLEASE SCHEDULE OFFICE VISIT BEFORE ANY FUTURE REFILL.   No facility-administered encounter medications on file as of 02/19/2024.    Allergies (verified) Simvastatin   History: Past Medical History:  Diagnosis Date   Anemia    Cyst, pilonidal, with abscess 06/12/2015   Diabetes mellitus without complication (HCC)    type 2   Hemorrhoid 06/12/2015   Hip strain    Hyperlipidemia    Hypertension    Past Surgical History:   Procedure Laterality Date   COLONOSCOPY WITH PROPOFOL  N/A 02/17/2021   Procedure: COLONOSCOPY WITH PROPOFOL ;  Surgeon: Irby Mannan, MD;  Location: [facility];  Service: Endoscopy;  Laterality: N/A;   LUNG SURGERY Left 1985   S/P RECURRENT PNEUMOTHORAX   VASECTOMY  1981   Family History  Problem Relation Age of Onset   Diabetes Mother    Cancer Father        liver   Early death Father    COPD Sister    Cancer Brother        stomach and intestine   Early death Brother    Diabetes Brother    Crohn's disease Brother    Diabetes Brother    Diabetes Brother    Prostate cancer Neg Hx    Breast cancer Neg Hx    Social History   Socioeconomic History   Marital status: Married    Spouse name: Jannetta Men   Number of children: 1   Years of education: Not on file   Highest education level: Associate degree: occupational, Scientist, product/process development, or vocational program  Occupational History   Occupation: Retired    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)  Tobacco Use   Smoking status: Former    Current packs/day: 0.00    Types: Cigarettes    Quit date: 10/24/1984    Years since quitting: 39.3   Smokeless tobacco: Never   Tobacco comments:    Quit 1986  Vaping Use   Vaping status: Never Used  Substance and Sexual Activity   Alcohol use: Yes    Comment: beer 1-2 monthly   Drug use: Yes    Frequency: 1.0 times per week    Types: Marijuana   Sexual activity: Not Currently    Birth control/protection: Other-see comments    Comment: age related  Other Topics Concern   Not on file  Social History Narrative   Has one son who lives in [place]   Social Drivers of Health   Financial Resource Strain: Low Risk  (02/18/2024)   Overall Financial Resource Strain (CARDIA)    Difficulty of Paying Living Expenses: Not hard at all  Food Insecurity: No Food Insecurity (02/18/2024)   Hunger Vital Sign    Worried About Running Out of Food in the Last Year: Never true    Ran Out of  Food in the Last Year: Never true  Transportation Needs: No Transportation Needs (02/18/2024)   PRAPARE - Administrator, Civil Service (Medical): No    Lack of Transportation (Non-Medical): No  Physical Activity: Sufficiently Active (02/18/2024)   Exercise Vital Sign    Days of Exercise per Week: 6 days    Minutes of Exercise per Session: 150+ min  Stress: No Stress Concern Present (02/18/2024)   [employer] of Occupational Health - Occupational Stress Questionnaire    Feeling of Stress : Not at all  Social Connections: Socially Isolated (  02/18/2024)   Social Connection and Isolation Panel [NHANES]    Frequency of Communication with Friends and Family: Once a week    Frequency of Social Gatherings with Friends and Family: Once a week    Attends Religious Services: Never    Database administrator or Organizations: No    Attends Engineer, structural: Never    Marital Status: Married    Tobacco Counseling Counseling given: Yes Tobacco comments: Quit 1986    Clinical Intake:  Pre-visit preparation completed: Yes  Pain : No/denies pain     BMI - recorded: 21.28 Nutritional Status: BMI of 19-24  Normal Nutritional Risks: None Diabetes: No (per patient he is not a diabetic)  Lab Results  Component Value Date   HGBA1C 6.3 (H) 09/15/2023   HGBA1C 6.0 (A) 05/19/2023   HGBA1C 7.1 (H) 09/21/2022     How often do you need to have someone help you when you read instructions, pamphlets, or other written materials from your doctor or pharmacy?: 1 - Never  Interpreter Needed?: No  Information entered by :: Sally Crazier CMA   Activities of Daily Living     02/18/2024    5:42 PM 05/19/2023    8:34 AM  In your present state of health, do you have any difficulty performing the following activities:  Hearing? 0 0  Vision? 0 0  Difficulty concentrating or making decisions? 0 0  Walking or climbing stairs? 0 0  Dressing or bathing? 0 0  Doing errands,  shopping? 0 0  Preparing Food and eating ? N   Using the Toilet? N   In the past six months, have you accidently leaked urine? N   Do you have problems with loss of bowel control? N   Managing your Medications? N   Managing your Finances? N   Housekeeping or managing your Housekeeping? N     Patient Care Team: Lamon Pillow, MD as PCP - General (Family Medicine) Julia Oats, OD as Consulting Physician (Optometry) Harris Liming, MD as Referring Physician (Dermatology) Marlynn Singer, MD (Orthopedic Surgery)  Indicate any recent Medical Services you may have received from other than [facility] providers in the past year (date may be approximate).     Assessment:   This is a routine wellness examination for Jeremiah Meyer.  Hearing/Vision screen Hearing Screening - Comments:: Patient denies any hearing difficulties.   Vision Screening - Comments:: Wears rx glasses - up to date with routine eye exams  Patient sees Dr. Alto Atta in [place]    Goals Addressed             This Visit's Progress    Patient Stated       To keep doing what I'm doing. Working in my shop, yard work Catering manager.        Depression Screen     02/19/2024   11:27 AM 09/15/2023    8:51 AM 05/19/2023    8:33 AM 02/14/2023   10:41 AM 09/21/2022    8:30 AM 05/30/2022    3:34 PM 05/25/2021    9:06 AM  PHQ 2/9 Scores  PHQ - 2 Score 0 0 0 0 0 0 0  PHQ- 9 Score 0  0  0 0 1    Fall Risk     02/18/2024    5:42 PM 09/15/2023    8:51 AM 05/19/2023    8:33 AM 02/14/2023   10:37 AM 05/30/2022    3:37 PM  Fall Risk  Falls in the past year? 0 0 0 0 0  Number falls in past yr: 0 0 0 0 0  Injury with Fall? 0 0 0 0 0  Risk for fall due to : No Fall Risks No Fall Risks No Fall Risks No Fall Risks No Fall Risks  Follow up Falls prevention discussed;Falls evaluation completed  Falls evaluation completed Falls prevention discussed;Education provided Falls evaluation completed    MEDICARE RISK AT HOME:  Medicare Risk at  Home Any stairs in or around the home?: (Patient-Rptd) Yes If so, are there any without handrails?: (Patient-Rptd) No Home free of loose throw rugs in walkways, pet beds, electrical cords, etc?: (Patient-Rptd) Yes Adequate lighting in your home to reduce risk of falls?: (Patient-Rptd) Yes Life alert?: (Patient-Rptd) No Use of a cane, walker or w/c?: (Patient-Rptd) No Grab bars in the bathroom?: (Patient-Rptd) No Shower chair or bench in shower?: (Patient-Rptd) No Elevated toilet seat or a handicapped toilet?: (Patient-Rptd) No  TIMED UP AND GO:  Was the test performed?  No  Cognitive Function: Declined/Normal: No cognitive concerns noted by patient or family. Patient alert, oriented, able to answer questions appropriately and recall recent events. No signs of memory loss or confusion.        02/19/2024   11:27 AM 02/14/2023   10:48 AM 05/25/2021    9:00 AM 02/01/2017    9:20 AM  6CIT Screen  What Year? 0 points 0 points 0 points 0 points  What month? 0 points 0 points 0 points 0 points  What time? 0 points 0 points 0 points 0 points  Count back from 20 0 points 0 points 0 points 0 points  Months in reverse 0 points 0 points 0 points 0 points  Repeat phrase 0 points 0 points 4 points 4 points  Total Score 0 points 0 points 4 points 4 points    Immunizations Immunization History  Administered Date(s) Administered   Fluad Quad(high Dose 65+) 09/17/2022   Influenza, High Dose Seasonal PF 06/19/2017, 08/14/2018, 06/28/2019, 07/27/2020, 07/27/2021, 08/11/2023   Influenza,inj,Quad PF,6+ Mos 08/25/2014, 09/04/2015   PFIZER Comirnaty(Gray Top)Covid-19 Tri-Sucrose Vaccine 02/23/2021   PFIZER(Purple Top)SARS-COV-2 Vaccination 12/13/2019, 01/03/2020, 07/27/2020   Pfizer Covid-19 Vaccine Bivalent Booster 63yrs & up 07/27/2021   Pfizer(Comirnaty)Fall Seasonal Vaccine 12 years and older 09/17/2022, 08/11/2023   Pneumococcal Conjugate-13 01/27/2016   Pneumococcal Polysaccharide-23 09/09/2013,  05/03/2019   Respiratory Syncytial Virus Vaccine,Recomb Aduvanted(Arexvy) 09/17/2022   Td 10/24/1997   Tdap 12/06/2018   Zoster Recombinant(Shingrix) 05/03/2019, 01/24/2020    Screening Tests Health Maintenance  Topic Date Due   OPHTHALMOLOGY EXAM  03/16/2023   COVID-19 Vaccine (8 - Pfizer risk 2024-25 season) 02/09/2024   HEMOGLOBIN A1C  03/14/2024   INFLUENZA VACCINE  05/24/2024   Diabetic kidney evaluation - eGFR measurement  09/14/2024   Diabetic kidney evaluation - Urine ACR  09/14/2024   Medicare Annual Wellness (AWV)  02/18/2025   DTaP/Tdap/Td (3 - Td or Tdap) 12/06/2028   Colonoscopy  02/18/2031   Pneumonia Vaccine 35+ Years old  Completed   Hepatitis C Screening  Completed   Zoster Vaccines- Shingrix  Completed   HPV VACCINES  Aged Out   Meningococcal B Vaccine  Aged Out    Health Maintenance  Health Maintenance Due  Topic Date Due   OPHTHALMOLOGY EXAM  03/16/2023   COVID-19 Vaccine (8 - Pfizer risk 2024-25 season) 02/09/2024   Health Maintenance Items Addressed: Last eye exam requested from Dr. Idalia Mais  Additional Screening:  Vision Screening:  Recommended annual ophthalmology exams for early detection of glaucoma and other disorders of the eye.  Dental Screening: Recommended annual dental exams for proper oral hygiene  Community Resource Referral / Chronic Care Management: CRR required this visit?  No   CCM required this visit?  No     Plan:     I have personally reviewed and noted the following in the patient's chart:   Medical and social history Use of alcohol, tobacco or illicit drugs  Current medications and supplements including opioid prescriptions. Patient is not currently taking opioid prescriptions. Functional ability and status Nutritional status Physical activity Advanced directives List of other physicians Hospitalizations, surgeries, and ER visits in previous 12 months Vitals Screenings to include cognitive, depression, and  falls Referrals and appointments  In addition, I have reviewed and discussed with patient certain preventive protocols, quality metrics, and best practice recommendations. A written personalized care plan for preventive services as well as general preventive health recommendations were provided to patient.      Sanaa Zilberman, CMA   02/19/2024   After Visit Summary: (MyChart) Due to this being a telephonic visit, the after visit summary with patients personalized plan was offered to patient via MyChart   Notes: Nothing significant to report at this time.

## 2024-02-24 ENCOUNTER — Other Ambulatory Visit: Payer: Self-pay | Admitting: Family Medicine

## 2024-02-24 DIAGNOSIS — G2581 Restless legs syndrome: Secondary | ICD-10-CM

## 2024-02-24 DIAGNOSIS — E538 Deficiency of other specified B group vitamins: Secondary | ICD-10-CM

## 2024-03-11 ENCOUNTER — Encounter: Payer: Self-pay | Admitting: Family Medicine

## 2024-03-11 ENCOUNTER — Ambulatory Visit: Payer: Self-pay | Admitting: Family Medicine

## 2024-03-11 VITALS — BP 132/85 | HR 58 | Ht 64.0 in | Wt 125.0 lb

## 2024-03-11 DIAGNOSIS — E119 Type 2 diabetes mellitus without complications: Secondary | ICD-10-CM

## 2024-03-11 DIAGNOSIS — I10 Essential (primary) hypertension: Secondary | ICD-10-CM | POA: Diagnosis not present

## 2024-03-11 NOTE — Progress Notes (Signed)
 Established patient visit   Patient: Jeremiah Meyer   DOB: 22-Sep-1951   73 y.o. Male  MRN: [id number] Visit Date: 03/11/2024  Today's healthcare provider: Jeralene Mom, MD   Chief Complaint  Patient presents with   Follow-up    No concerns   Subjective    HPI  Follow up htn and diabetes. Fels well. Staying active. Doesn't eat much during day and has occasional hypoglycemic symptoms. Admits to consuming sweet snacks at night. Taking meds consistently.   Lab Results  Component Value Date   HGBA1C 6.3 (H) 09/15/2023   HGBA1C 6.0 (A) 05/19/2023   HGBA1C 7.1 (H) 09/21/2022   Lab Results  Component Value Date   NA 141 09/15/2023   K 4.7 09/15/2023   CREATININE 0.99 09/15/2023   EGFR 81 09/15/2023   GLUCOSE 143 (H) 09/15/2023     Medications: Outpatient Medications Prior to Visit  Medication Sig   aspirin 81 MG tablet Take 81 mg by mouth daily.   betamethasone  dipropionate (DIPROLENE ) 0.05 % cream Apply topically daily. (Patient taking differently: Apply topically daily. As needed for psoriasis flare ups)   Blood Glucose Monitoring Suppl (ONE TOUCH ULTRA 2) w/Device KIT Use to check blood sugar daily for type 2 diabetes   cyanocobalamin  (VITAMIN B12) 1000 MCG/ML injection INJECT 1 MLS MONTHLY AS DIRECTED   ferrous gluconate  (FERGON) 324 MG tablet TAKE 1 TABLET BY MOUTH EVERY DAY WITH BREAKFAST   fluticasone  (FLONASE ) 50 MCG/ACT nasal spray Place 2 sprays into both nostrils daily.   gabapentin  (NEURONTIN ) 300 MG capsule TAKE 1 CAPSULE BY MOUTH THREE TIMES A DAY   glipiZIDE  (GLUCOTROL  XL) 2.5 MG 24 hr tablet Take 1 tablet (2.5 mg total) by mouth daily with breakfast.   lisinopril -hydrochlorothiazide  (ZESTORETIC ) 10-12.5 MG tablet TAKE 1 TABLET BY MOUTH EVERY DAY   meloxicam  (MOBIC ) 15 MG tablet Take 1 tablet (15 mg total) by mouth daily as needed.   metFORMIN (GLUCOPHAGE) 1000 MG tablet TAKE 1 TABLET BY MOUTH TWICE A DAY   Multiple Vitamin tablet Take 1 tablet by  mouth daily.   ONETOUCH ULTRA test strip USE 1 STRIP TO CHECK BLOOD SUGAR ONCE DAILY E11.9   pramipexole  (MIRAPEX ) 0.5 MG tablet TAKE 1 TABLET BY MOUTH EVERYDAY AT BEDTIME   rosuvastatin  (CRESTOR ) 5 MG tablet TAKE 1 TABLET BY MOUTH EVERY DAY   sitaGLIPtin  (JANUVIA ) 100 MG tablet TAKE 1 TABLET (100 MG TOTAL) BY MOUTH DAILY. PLEASE SCHEDULE OFFICE VISIT BEFORE ANY FUTURE REFILL.   No facility-administered medications prior to visit.    Review of Systems  Constitutional:  Negative for appetite change, chills and fever.  Respiratory:  Negative for chest tightness, shortness of breath and wheezing.   Cardiovascular:  Negative for chest pain and palpitations.  Gastrointestinal:  Negative for abdominal pain, nausea and vomiting.       Objective    BP 132/85   Pulse (!) 58   Ht 5\' 4"  (1.626 m)   Wt 125 lb (56.7 kg)   SpO2 97%   BMI 21.46 kg/m    Physical Exam   General appearance: Well developed, well nourished male, cooperative and in no acute distress Head: Normocephalic, without obvious abnormality, atraumatic Respiratory: Respirations even and unlabored, normal respiratory rate Extremities: All extremities are intact.  Skin: Skin color, texture, turgor normal. No rashes seen  Psych: Appropriate mood and affect. Neurologic: Mental status: Alert, oriented to person, place, and time, thought content appropriate.     Assessment & Plan  1. Controlled type 2 diabetes mellitus without complication, without long-term current use of insulin  (HCC) (Primary) Doing well with current medications although having occasional hypoglycemia which he feels he is able to manage well. - Hemoglobin A1c  Consider stopping glipizide  if A1c is < 6.5. Pattient admits that he is not going to be able to get any more strict with his diet.   2. Essential (primary) hypertension Well controlled.  Continue current medications.     Return in about 6 months (around 09/11/2024) for Diabetes, Yearly  Physical.         Jeralene Mom, MD  [facility] [phone] (phone) [phone] (fax)  [facility]

## 2024-03-11 NOTE — Patient Instructions (Signed)
 Jeremiah Meyer  Please review the attached list of medications and notify my office if there are any errors.   . Please bring all of your medications to every appointment so we can make sure that our medication list is the same as yours.

## 2024-03-12 ENCOUNTER — Ambulatory Visit: Payer: Self-pay | Admitting: Family Medicine

## 2024-03-12 LAB — HEMOGLOBIN A1C
Est. average glucose Bld gHb Est-mCnc: 146 mg/dL
Hgb A1c MFr Bld: 6.7 % — ABNORMAL HIGH (ref 4.8–5.6)

## 2024-03-27 ENCOUNTER — Encounter: Payer: Self-pay | Admitting: Family Medicine

## 2024-05-16 ENCOUNTER — Other Ambulatory Visit: Payer: Self-pay | Admitting: Family Medicine

## 2024-05-16 DIAGNOSIS — E119 Type 2 diabetes mellitus without complications: Secondary | ICD-10-CM

## 2024-06-18 ENCOUNTER — Ambulatory Visit (INDEPENDENT_AMBULATORY_CARE_PROVIDER_SITE_OTHER): Admitting: Dermatology

## 2024-06-18 ENCOUNTER — Encounter: Payer: Self-pay | Admitting: Dermatology

## 2024-06-18 ENCOUNTER — Encounter: Payer: PPO | Admitting: Dermatology

## 2024-06-18 DIAGNOSIS — W908XXA Exposure to other nonionizing radiation, initial encounter: Secondary | ICD-10-CM | POA: Diagnosis not present

## 2024-06-18 DIAGNOSIS — L821 Other seborrheic keratosis: Secondary | ICD-10-CM

## 2024-06-18 DIAGNOSIS — L578 Other skin changes due to chronic exposure to nonionizing radiation: Secondary | ICD-10-CM | POA: Diagnosis not present

## 2024-06-18 DIAGNOSIS — L814 Other melanin hyperpigmentation: Secondary | ICD-10-CM

## 2024-06-18 DIAGNOSIS — L570 Actinic keratosis: Secondary | ICD-10-CM

## 2024-06-18 DIAGNOSIS — Z1283 Encounter for screening for malignant neoplasm of skin: Secondary | ICD-10-CM | POA: Diagnosis not present

## 2024-06-18 DIAGNOSIS — D1801 Hemangioma of skin and subcutaneous tissue: Secondary | ICD-10-CM

## 2024-06-18 DIAGNOSIS — D229 Melanocytic nevi, unspecified: Secondary | ICD-10-CM

## 2024-06-18 NOTE — Patient Instructions (Signed)

## 2024-06-18 NOTE — Progress Notes (Signed)
   Follow-Up Visit   Subjective  Jeremiah Meyer is a 73 y.o. male who presents for the following: Skin Cancer Screening and Full Body Skin Exam  The patient presents for Total-Body Skin Exam (TBSE) for skin cancer screening and mole check. The patient has spots, moles and lesions to be evaluated, some may be new or changing and the patient may have concern these could be cancer.  Hx AK. Spot at left temple.   The following portions of the chart were reviewed this encounter and updated as appropriate: medications, allergies, medical history  Review of Systems:  No other skin or systemic complaints except as noted in HPI or Assessment and Plan.  Objective  Well appearing patient in no apparent distress; mood and affect are within normal limits.  A full examination was performed including scalp, head, eyes, ears, nose, lips, neck, chest, axillae, abdomen, back, buttocks, bilateral upper extremities, bilateral lower extremities, hands, feet, fingers, toes, fingernails, and toenails. All findings within normal limits unless otherwise noted below.   Relevant physical exam findings are noted in the Assessment and Plan.  nasal dorsum Erythematous thin papules/macules with gritty scale.   Assessment & Plan   SKIN CANCER SCREENING PERFORMED TODAY.  ACTINIC DAMAGE - Chronic condition, secondary to cumulative UV/sun exposure - diffuse scaly erythematous macules with underlying dyspigmentation - Recommend daily broad spectrum sunscreen SPF 30+ to sun-exposed areas, reapply every 2 hours as needed.  - Staying in the shade or wearing long sleeves, sun glasses (UVA+UVB protection) and wide brim hats (4-inch brim around the entire circumference of the hat) are also recommended for sun protection.  - Call for new or changing lesions.  LENTIGINES, SEBORRHEIC KERATOSES, HEMANGIOMAS - Benign normal skin lesions - Benign-appearing - Call for any changes  MELANOCYTIC NEVI - Tan-brown and/or  pink-flesh-colored symmetric macules and papules - Benign appearing on exam today - Observation - Call clinic for new or changing moles - Recommend daily use of broad spectrum spf 30+ sunscreen to sun-exposed areas.    AK (ACTINIC KERATOSIS) nasal dorsum Actinic keratoses are precancerous spots that appear secondary to cumulative UV radiation exposure/sun exposure over time. They are chronic with expected duration over 1 year. A portion of actinic keratoses will progress to squamous cell carcinoma of the skin. It is not possible to reliably predict which spots will progress to skin cancer and so treatment is recommended to prevent development of skin cancer.  Recommend daily broad spectrum sunscreen SPF 30+ to sun-exposed areas, reapply every 2 hours as needed.  Recommend staying in the shade or wearing long sleeves, sun glasses (UVA+UVB protection) and wide brim hats (4-inch brim around the entire circumference of the hat). Call for new or changing lesions.  Offered cryo. patient deferred today, will monitor and recheck.  MULTIPLE BENIGN NEVI   LENTIGINES   ACTINIC ELASTOSIS   SEBORRHEIC KERATOSES   CHERRY ANGIOMA   Return in about 1 year (around 06/18/2025) for AK follow up, TBSE, with Dr. Claudene.  LILLETTE Lonell Drones, RMA, am acting as scribe for Boneta Claudene, MD .   Documentation: I have reviewed the above documentation for accuracy and completeness, and I agree with the above.  Boneta Claudene, MD

## 2024-08-07 ENCOUNTER — Other Ambulatory Visit: Payer: Self-pay | Admitting: Family Medicine

## 2024-08-07 DIAGNOSIS — E119 Type 2 diabetes mellitus without complications: Secondary | ICD-10-CM

## 2024-08-19 ENCOUNTER — Other Ambulatory Visit: Payer: Self-pay | Admitting: Family Medicine

## 2024-08-19 DIAGNOSIS — E119 Type 2 diabetes mellitus without complications: Secondary | ICD-10-CM

## 2024-09-11 ENCOUNTER — Ambulatory Visit (INDEPENDENT_AMBULATORY_CARE_PROVIDER_SITE_OTHER): Admitting: Family Medicine

## 2024-09-11 ENCOUNTER — Encounter: Payer: Self-pay | Admitting: Family Medicine

## 2024-09-11 VITALS — BP 128/81 | HR 63 | Resp 16 | Ht 64.0 in | Wt 124.0 lb

## 2024-09-11 DIAGNOSIS — Z0000 Encounter for general adult medical examination without abnormal findings: Secondary | ICD-10-CM

## 2024-09-11 DIAGNOSIS — G2581 Restless legs syndrome: Secondary | ICD-10-CM

## 2024-09-11 DIAGNOSIS — E78019 Familial hypercholesterolemia, unspecified: Secondary | ICD-10-CM | POA: Diagnosis not present

## 2024-09-11 DIAGNOSIS — L409 Psoriasis, unspecified: Secondary | ICD-10-CM | POA: Diagnosis not present

## 2024-09-11 DIAGNOSIS — I10 Essential (primary) hypertension: Secondary | ICD-10-CM | POA: Diagnosis not present

## 2024-09-11 DIAGNOSIS — Z125 Encounter for screening for malignant neoplasm of prostate: Secondary | ICD-10-CM

## 2024-09-11 DIAGNOSIS — Z0001 Encounter for general adult medical examination with abnormal findings: Secondary | ICD-10-CM | POA: Diagnosis not present

## 2024-09-11 DIAGNOSIS — E538 Deficiency of other specified B group vitamins: Secondary | ICD-10-CM | POA: Diagnosis not present

## 2024-09-11 DIAGNOSIS — E119 Type 2 diabetes mellitus without complications: Secondary | ICD-10-CM

## 2024-09-11 NOTE — Patient Instructions (Signed)
 Jeremiah Meyer  Please review the attached list of medications and notify my office if there are any errors.   . Please bring all of your medications to every appointment so we can make sure that our medication list is the same as yours.

## 2024-09-11 NOTE — Progress Notes (Signed)
 Complete physical exam   Patient: Jeremiah Meyer   DOB: 03-25-1951   73 y.o. Male  MRN: [id number] Visit Date: 09/11/2024  Today's healthcare provider: Nancyann Perry, MD   Chief Complaint  Patient presents with   Annual Exam   Subjective    Discussed the use of AI scribe software for clinical note transcription with the patient, who gave verbal consent to proceed.  History of Present Illness   Jeremiah Meyer is a 73 year old male who presents for an annual physical exam.  He mentions variability in his blood sugar levels, particularly influenced by evening activities such as consuming sweets while watching TV. Despite losing weight, he suspects his A1c might be around 7. He checks his blood sugar every morning but does not regularly monitor his blood pressure at home.  He describes a persistent dull sensation across a specific area, and reports that he previously had a sciatic nerve issue. This sensation has been present for a long time and is described as 'very, very slight'.  No stomach problems, cramping, or changes in bowel habits. He has not experienced any significant issues with his hearing, although he notes some difficulty with low tones, which he attributes to age.  He has not yet received his flu shot this year.         Past Medical History:  Diagnosis Date   Anemia    Cyst, pilonidal, with abscess 06/12/2015   Diabetes mellitus without complication (HCC)    type 2   Hemorrhoid 06/12/2015   Hip strain    Hyperlipidemia    Hypertension    Past Surgical History:  Procedure Laterality Date   COLONOSCOPY WITH PROPOFOL  N/A 02/17/2021   Procedure: COLONOSCOPY WITH PROPOFOL ;  Surgeon: Janalyn Keene NOVAK, MD;  Location: [facility] ENDOSCOPY;  Service: Endoscopy;  Laterality: N/A;   LUNG SURGERY Left 1985   S/P RECURRENT PNEUMOTHORAX   VASECTOMY  1981   Social History   Socioeconomic History   Marital status: Married    Spouse name: Janina   Number of  children: 1   Years of education: Not on file   Highest education level: Associate degree: occupational, scientist, product/process development, or vocational program  Occupational History   Occupation: Retired    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)  Tobacco Use   Smoking status: Former    Current packs/day: 0.00    Types: Cigarettes    Quit date: 10/24/1984    Years since quitting: 39.9   Smokeless tobacco: Never   Tobacco comments:    Quit 1986  Vaping Use   Vaping status: Never Used  Substance and Sexual Activity   Alcohol use: Yes    Comment: beer 1-2 monthly   Drug use: Yes    Frequency: 1.0 times per week    Types: Marijuana   Sexual activity: Not Currently    Birth control/protection: Other-see comments    Comment: age related  Other Topics Concern   Not on file  Social History Narrative   Has one son who lives in [place]   Social Drivers of Health   Financial Resource Strain: Low Risk  (09/08/2024)   Overall Financial Resource Strain (CARDIA)    Difficulty of Paying Living Expenses: Not hard at all  Food Insecurity: No Food Insecurity (09/08/2024)   Hunger Vital Sign    Worried About Running Out of Food in the Last Year: Never true    Ran Out of Food in the Last Year: Never true  Transportation Needs: No Transportation Needs (09/08/2024)   PRAPARE - Administrator, Civil Service (Medical): No    Lack of Transportation (Non-Medical): No  Physical Activity: Unknown (09/08/2024)   Exercise Vital Sign    Days of Exercise per Week: 7 days    Minutes of Exercise per Session: Not on file  Stress: No Stress Concern Present (09/08/2024)   [employer] of Occupational Health - Occupational Stress Questionnaire    Feeling of Stress: Not at all  Social Connections: Socially Isolated (09/08/2024)   Social Connection and Isolation Panel    Frequency of Communication with Friends and Family: Once a week    Frequency of Social Gatherings with Friends and Family: Once a  week    Attends Religious Services: Never    Database Administrator or Organizations: No    Attends Engineer, Structural: Not on file    Marital Status: Married  Catering Manager Violence: Not At Risk (02/19/2024)   Humiliation, Afraid, Rape, and Kick questionnaire    Fear of Current or Ex-Partner: No    Emotionally Abused: No    Physically Abused: No    Sexually Abused: No   Family Status  Relation Name Status   Mother Ethan Clayburn Deceased   Father Dontavis Tschantz Deceased   Sister Chyrl Kazuto Sevey Alive   Brother Cassady Turano Deceased   Brother  Alive   Brother  Alive   Brother Gene [place] Alive   Brother  Alive   Brother Adonias Demore Alive   Neg Hx  (Not Specified)  No partnership data on file   Family History  Problem Relation Age of Onset   Diabetes Mother    Cancer Father        liver   Early death Father    COPD Sister    Cancer Brother        stomach and intestine   Early death Brother    Diabetes Brother    Crohn's disease Brother    Diabetes Brother    Diabetes Brother    Prostate cancer Neg Hx    Breast cancer Neg Hx    Allergies  Allergen Reactions   Simvastatin     Back and leg pain    Patient Care Team: Gasper Nancyann BRAVO, MD as PCP - General (Family Medicine) Mevelyn JONETTA Bathe, OD as Consulting Physician (Optometry) Sharps Lehmann, MD as Referring Physician (Dermatology) Cleotilde Barrio, MD (Orthopedic Surgery)   Medications: Outpatient Medications Prior to Visit  Medication Sig   aspirin 81 MG tablet Take 81 mg by mouth daily.   betamethasone  dipropionate (DIPROLENE ) 0.05 % cream Apply topically daily. (Patient taking differently: Apply topically daily. As needed for psoriasis flare ups)   Blood Glucose Monitoring Suppl (ONE TOUCH ULTRA 2) w/Device KIT Use to check blood sugar daily for type 2 diabetes   cyanocobalamin  (VITAMIN B12) 1000 MCG/ML injection INJECT 1 MLS MONTHLY AS DIRECTED   ferrous gluconate  (FERGON) 324  MG tablet TAKE 1 TABLET BY MOUTH EVERY DAY WITH BREAKFAST   fluticasone  (FLONASE ) 50 MCG/ACT nasal spray Place 2 sprays into both nostrils daily.   gabapentin  (NEURONTIN ) 300 MG capsule TAKE 1 CAPSULE BY MOUTH THREE TIMES A DAY   glipiZIDE  (GLUCOTROL  XL) 2.5 MG 24 hr tablet TAKE 1 TABLET BY MOUTH DAILY WITH BREAKFAST.   glucose blood (ONETOUCH ULTRA) test strip USE 1 STRIP TO CHECK BLOOD SUGAR ONCE DAILY E11.9   lisinopril -hydrochlorothiazide  (ZESTORETIC ) 10-12.5 MG tablet TAKE 1 TABLET  BY MOUTH EVERY DAY   meloxicam  (MOBIC ) 15 MG tablet Take 1 tablet (15 mg total) by mouth daily as needed.   metFORMIN (GLUCOPHAGE) 1000 MG tablet TAKE 1 TABLET BY MOUTH TWICE A DAY   Multiple Vitamin tablet Take 1 tablet by mouth daily.   pramipexole  (MIRAPEX ) 0.5 MG tablet TAKE 1 TABLET BY MOUTH EVERYDAY AT BEDTIME   rosuvastatin  (CRESTOR ) 5 MG tablet TAKE 1 TABLET BY MOUTH EVERY DAY   sitaGLIPtin  (JANUVIA ) 100 MG tablet TAKE 1 TABLET (100 MG TOTAL) BY MOUTH DAILY. PLEASE SCHEDULE OFFICE VISIT BEFORE ANY FUTURE REFILL.   No facility-administered medications prior to visit.    Review of Systems  Constitutional:  Negative for appetite change, chills and fever.  Respiratory:  Negative for chest tightness, shortness of breath and wheezing.   Cardiovascular:  Negative for chest pain and palpitations.  Gastrointestinal:  Negative for abdominal pain, nausea and vomiting.      Objective    BP 128/81 (BP Location: Left Arm, Patient Position: Sitting, Cuff Size: Normal)   Pulse 63   Resp 16   Ht 5' 4 (1.626 m)   Wt 124 lb (56.2 kg)   SpO2 100%   BMI 21.28 kg/m    Physical Exam   General Appearance:    Well developed, well nourished male. Alert, cooperative, in no acute distress, appears stated age  Head:    Normocephalic, without obvious abnormality, atraumatic  Eyes:    PERRL, conjunctiva/corneas clear, EOM's intact, fundi    benign, both eyes       Ears:    Normal TM's and external ear canals,  both ears  Nose:   Nares normal, septum midline, mucosa normal, no drainage   or sinus tenderness  Throat:   Lips, mucosa, and tongue normal; teeth and gums normal  Neck:   Supple, symmetrical, trachea midline, no adenopathy;       thyroid :  No enlargement/tenderness/nodules; no carotid   bruit or JVD  Back:     Symmetric, no curvature, ROM normal, no CVA tenderness  Lungs:     Clear to auscultation bilaterally, respirations unlabored  Chest wall:    No tenderness or deformity  Heart:    Normal heart rate. Normal rhythm. No murmurs, rubs, or gallops.  S1 and S2 normal  Abdomen:     Soft, non-tender, bowel sounds active all four quadrants,    no masses, no organomegaly  Genitalia:    deferred  Rectal:    deferred  Extremities:   All extremities are intact. No cyanosis or edema  Pulses:   2+ and symmetric all extremities  Skin:   Skin color, texture, turgor normal, no rashes or lesions  Lymph nodes:   Cervical, supraclavicular, and axillary nodes normal  Neurologic:   CNII-XII intact. Normal strength, sensation and reflexes      throughout    Last depression screening scores    09/11/2024    8:20 AM 03/11/2024    8:28 AM 02/19/2024   11:27 AM  PHQ 2/9 Scores  PHQ - 2 Score 0 0 0  PHQ- 9 Score  0  0      Data saved with a previous flowsheet row definition   Last fall risk screening    09/11/2024    8:20 AM  Fall Risk   Falls in the past year? 0  Number falls in past yr: 0  Injury with Fall? 0  Risk for fall due to : No Fall Risks   Last  Audit-C alcohol use screening    09/08/2024    5:20 PM  Alcohol Use Disorder Test (AUDIT)  1. How often do you have a drink containing alcohol? 1  2. How many drinks containing alcohol do you have on a typical day when you are drinking? 0  3. How often do you have six or more drinks on one occasion? 0  AUDIT-C Score 1      Patient-reported   A score of 3 or more in women, and 4 or more in men indicates increased risk for alcohol  abuse, EXCEPT if all of the points are from question 1   No results found for any visits on 09/11/24.  Assessment & Plan    Routine Health Maintenance and Physical Exam  Exercise Activities and Dietary recommendations  Goals      DIET - EAT MORE FRUITS AND VEGETABLES     DIET - INCREASE WATER INTAKE     Recommend increasing water intake to 6-8 glasses a day.       Exercise 150 minutes per week (moderate activity)     Patient Stated     To keep doing what I'm doing. Working in my shop, yard work catering manager.         Immunization History  Administered Date(s) Administered   Fluad Quad(high Dose 65+) 09/17/2022   INFLUENZA, HIGH DOSE SEASONAL PF 06/19/2017, 08/14/2018, 06/28/2019, 07/27/2020, 07/27/2021, 08/11/2023   Influenza,inj,Quad PF,6+ Mos 08/25/2014, 09/04/2015   PFIZER Comirnaty(Gray Top)Covid-19 Tri-Sucrose Vaccine 02/23/2021   PFIZER(Purple Top)SARS-COV-2 Vaccination 12/13/2019, 01/03/2020, 07/27/2020   Pfizer Covid-19 Vaccine Bivalent Booster 22yrs & up 07/27/2021   Pfizer(Comirnaty)Fall Seasonal Vaccine 12 years and older 09/17/2022, 08/11/2023   Pneumococcal Conjugate-13 01/27/2016   Pneumococcal Polysaccharide-23 09/09/2013, 05/03/2019   Respiratory Syncytial Virus Vaccine,Recomb Aduvanted(Arexvy) 09/17/2022   Td 10/24/1997   Tdap 12/06/2018   Zoster Recombinant(Shingrix) 05/03/2019, 01/24/2020    Health Maintenance  Topic Date Due   COVID-19 Vaccine (8 - 2025-26 season) 06/24/2024   HEMOGLOBIN A1C  09/11/2024   Diabetic kidney evaluation - eGFR measurement  09/14/2024   Diabetic kidney evaluation - Urine ACR  09/14/2024   Influenza Vaccine  01/21/2025 (Originally 05/24/2024)   OPHTHALMOLOGY EXAM  01/21/2025   Medicare Annual Wellness (AWV)  02/18/2025   DTaP/Tdap/Td (3 - Td or Tdap) 12/06/2028   Colonoscopy  02/18/2031   Pneumococcal Vaccine: 50+ Years  Completed   Hepatitis C Screening  Completed   Zoster Vaccines- Shingrix  Completed   Meningococcal B Vaccine   Aged Out    Discussed health benefits of physical activity, and encouraged him to engage in regular exercise appropriate for his age and condition.     Adult Wellness Visit Routine wellness visit with no acute concerns. - Ordered blood work. - Scheduled physical for next year.  Type 2 diabetes mellitus Fluctuating glucose levels, weight loss reported, A1c suspected around 7. - Ordered blood work to assess A1c and other parameters.  Essential hypertension No recent home monitoring, blood pressure reported as good.     B12 deficiency - Vitamin B12  Familial hypercholesterolemia, unspecified type He is tolerating rosuvastatin  well with no adverse effects.   - Comprehensive metabolic panel with GFR - CBC - Lipid panel  Restless leg Doing well on current dose of pramipexole .   Prostate cancer screening  - PSA Total (Reflex To Free)   Return in about 1 year (around 09/11/2025) for Yearly Physical.        Nancyann Perry, MD  [facility]  Family Practice [phone] (phone) [phone] (fax)  [facility]

## 2024-09-12 ENCOUNTER — Ambulatory Visit: Payer: Self-pay | Admitting: Family Medicine

## 2024-09-12 LAB — COMPREHENSIVE METABOLIC PANEL WITH GFR
ALT: 15 IU/L (ref 0–44)
AST: 22 IU/L (ref 0–40)
Albumin: 4.3 g/dL (ref 3.8–4.8)
Alkaline Phosphatase: 86 IU/L (ref 47–123)
BUN/Creatinine Ratio: 22 (ref 10–24)
BUN: 23 mg/dL (ref 8–27)
Bilirubin Total: 0.6 mg/dL (ref 0.0–1.2)
CO2: 24 mmol/L (ref 20–29)
Calcium: 9.6 mg/dL (ref 8.6–10.2)
Chloride: 99 mmol/L (ref 96–106)
Creatinine, Ser: 1.03 mg/dL (ref 0.76–1.27)
Globulin, Total: 2.1 g/dL (ref 1.5–4.5)
Glucose: 185 mg/dL — ABNORMAL HIGH (ref 70–99)
Potassium: 4.8 mmol/L (ref 3.5–5.2)
Sodium: 137 mmol/L (ref 134–144)
Total Protein: 6.4 g/dL (ref 6.0–8.5)
eGFR: 77 mL/min/1.73 (ref >59)

## 2024-09-12 LAB — VITAMIN B12: Vitamin B-12: 414 pg/mL (ref 232–1245)

## 2024-09-12 LAB — MICROALBUMIN / CREATININE URINE RATIO
Creatinine, Urine: 94.7 mg/dL
Microalb/Creat Ratio: <3 mg/g{creat} (ref 0–29)
Microalbumin, Urine: <3 ug/mL

## 2024-09-12 LAB — CBC
Hematocrit: 45.2 % (ref 37.5–51.0)
Hemoglobin: 15.1 g/dL (ref 13.0–17.7)
MCH: 31.2 pg (ref 26.6–33.0)
MCHC: 33.4 g/dL (ref 31.5–35.7)
MCV: 93 fL (ref 79–97)
Platelets: 263 x10E3/uL (ref 150–450)
RBC: 4.84 x10E6/uL (ref 4.14–5.80)
RDW: 12.4 % (ref 11.6–15.4)
WBC: 9.4 x10E3/uL (ref 3.4–10.8)

## 2024-09-12 LAB — LIPID PANEL
Chol/HDL Ratio: 2.8 ratio (ref 0.0–5.0)
Cholesterol, Total: 130 mg/dL (ref 100–199)
HDL: 46 mg/dL (ref >39)
LDL Chol Calc (NIH): 70 mg/dL (ref 0–99)
Triglycerides: 65 mg/dL (ref 0–149)
VLDL Cholesterol Cal: 14 mg/dL (ref 5–40)

## 2024-09-12 LAB — HEMOGLOBIN A1C
Est. average glucose Bld gHb Est-mCnc: 148 mg/dL
Hgb A1c MFr Bld: 6.8 % — ABNORMAL HIGH (ref 4.8–5.6)

## 2024-09-12 LAB — PSA TOTAL (REFLEX TO FREE): Prostate Specific Ag, Serum: 1.6 ng/mL (ref 0.0–4.0)

## 2024-09-12 LAB — TSH: TSH: 2.77 u[IU]/mL (ref 0.450–4.500)

## 2024-09-12 NOTE — Progress Notes (Signed)
 Jeremiah Meyer                                          MRN: [id number]   09/12/2024   The [facility] Quality Team Specialist reviewed this patient medical record for the purposes of chart review for care gap closure. The following were reviewed: abstraction for care gap closure-kidney health evaluation for diabetes:eGFR  and uACR.    [facility]BCI Quality Team

## 2024-11-14 ENCOUNTER — Other Ambulatory Visit: Payer: Self-pay | Admitting: Family Medicine

## 2024-11-14 DIAGNOSIS — G2581 Restless legs syndrome: Secondary | ICD-10-CM

## 2025-02-25 ENCOUNTER — Ambulatory Visit

## 2025-06-19 ENCOUNTER — Ambulatory Visit: Admitting: Dermatology

## 2025-09-12 ENCOUNTER — Encounter: Admitting: Family Medicine
